# Patient Record
Sex: Male | Born: 1975 | Hispanic: Yes | Marital: Single | Smoking: Never smoker
Health system: Southern US, Community
[De-identification: ages and names within clinical notes are randomized; demographics above are authoritative.]

## PROBLEM LIST (undated history)

## (undated) DIAGNOSIS — E119 Type 2 diabetes mellitus without complications: Secondary | ICD-10-CM

## (undated) DIAGNOSIS — E11319 Type 2 diabetes mellitus with unspecified diabetic retinopathy without macular edema: Secondary | ICD-10-CM

## (undated) DIAGNOSIS — L0231 Cutaneous abscess of buttock: Secondary | ICD-10-CM

## (undated) DIAGNOSIS — I10 Essential (primary) hypertension: Secondary | ICD-10-CM

## (undated) DIAGNOSIS — I2510 Atherosclerotic heart disease of native coronary artery without angina pectoris: Secondary | ICD-10-CM

## (undated) DIAGNOSIS — E1169 Type 2 diabetes mellitus with other specified complication: Secondary | ICD-10-CM

## (undated) HISTORY — DX: Essential (primary) hypertension: I10

## (undated) HISTORY — DX: Type 2 diabetes mellitus with other specified complication: E11.69

## (undated) HISTORY — DX: Atherosclerotic heart disease of native coronary artery without angina pectoris: I25.10

## (undated) HISTORY — DX: Type 2 diabetes mellitus without complications: E11.9

## (undated) HISTORY — DX: Cutaneous abscess of buttock: L02.31

## (undated) HISTORY — DX: Type 2 diabetes mellitus with unspecified diabetic retinopathy without macular edema: E11.319

---

## 2014-02-14 ENCOUNTER — Observation Stay (HOSPITAL_COMMUNITY): Payer: Self-pay

## 2014-02-14 ENCOUNTER — Observation Stay (HOSPITAL_COMMUNITY)
Admission: EM | Admit: 2014-02-14 | Discharge: 2014-02-15 | Disposition: A | Discharge status: Home / Self Care | Payer: Self-pay | Attending: Internal Medicine | Admitting: Internal Medicine

## 2014-02-14 ENCOUNTER — Encounter (HOSPITAL_COMMUNITY): Payer: Self-pay | Admitting: Emergency Medicine

## 2014-02-14 DIAGNOSIS — R112 Nausea with vomiting, unspecified: Secondary | ICD-10-CM | POA: Diagnosis present

## 2014-02-14 DIAGNOSIS — E1010 Type 1 diabetes mellitus with ketoacidosis without coma: Principal | ICD-10-CM | POA: Insufficient documentation

## 2014-02-14 DIAGNOSIS — E1110 Type 2 diabetes mellitus with ketoacidosis without coma: Secondary | ICD-10-CM | POA: Diagnosis present

## 2014-02-14 DIAGNOSIS — E876 Hypokalemia: Secondary | ICD-10-CM | POA: Insufficient documentation

## 2014-02-14 DIAGNOSIS — Z794 Long term (current) use of insulin: Secondary | ICD-10-CM | POA: Insufficient documentation

## 2014-02-14 DIAGNOSIS — K5900 Constipation, unspecified: Secondary | ICD-10-CM | POA: Insufficient documentation

## 2014-02-14 HISTORY — DX: Type 2 diabetes mellitus without complications: E11.9

## 2014-02-14 LAB — URINALYSIS, ROUTINE W REFLEX MICROSCOPIC
BILIRUBIN URINE: NEGATIVE
Glucose, UA: >1000 mg/dL — AB
Hgb urine dipstick: NEGATIVE
Ketones, ur: NEGATIVE mg/dL
NITRITE: NEGATIVE
Protein, ur: NEGATIVE mg/dL
Specific Gravity, Urine: 1.017 (ref 1.005–1.030)
UROBILINOGEN UA: 1 mg/dL (ref 0.0–1.0)
pH: 6.5 (ref 5.0–8.0)

## 2014-02-14 LAB — BASIC METABOLIC PANEL
BUN: 13 mg/dL (ref 6–23)
CALCIUM: 8.3 mg/dL — AB (ref 8.4–10.5)
CO2: 20 mEq/L (ref 19–32)
Chloride: 106 mEq/L (ref 96–112)
Creatinine, Ser: 0.51 mg/dL (ref 0.50–1.35)
GFR calc Af Amer: >90 mL/min (ref >90)
GLUCOSE: 163 mg/dL — AB (ref 70–99)
Potassium: 3.2 mEq/L — ABNORMAL LOW (ref 3.7–5.3)
SODIUM: 137 meq/L (ref 137–147)

## 2014-02-14 LAB — I-STAT ARTERIAL BLOOD GAS, ED
ACID-BASE DEFICIT: 5 mmol/L — AB (ref 0.0–2.0)
BICARBONATE: 19.6 meq/L — AB (ref 20.0–24.0)
O2 SAT: 96 %
Patient temperature: 98.6
TCO2: 21 mmol/L (ref 0–100)
pCO2 arterial: 35.6 mmHg (ref 35.0–45.0)
pH, Arterial: 7.349 — ABNORMAL LOW (ref 7.350–7.450)
pO2, Arterial: 84 mmHg (ref 80.0–100.0)

## 2014-02-14 LAB — CBC WITH DIFFERENTIAL/PLATELET
Basophils Absolute: 0 10*3/uL (ref 0.0–0.1)
Basophils Relative: 0 % (ref 0–1)
EOS ABS: 0.1 10*3/uL (ref 0.0–0.7)
Eosinophils Relative: 1 % (ref 0–5)
HCT: 41.4 % (ref 39.0–52.0)
Hemoglobin: 15.5 g/dL (ref 13.0–17.0)
LYMPHS ABS: 2.5 10*3/uL (ref 0.7–4.0)
LYMPHS PCT: 33 % (ref 12–46)
MCH: 30.5 pg (ref 26.0–34.0)
MCHC: 37.4 g/dL — ABNORMAL HIGH (ref 30.0–36.0)
MCV: 81.3 fL (ref 78.0–100.0)
Monocytes Absolute: 0.6 10*3/uL (ref 0.1–1.0)
Monocytes Relative: 8 % (ref 3–12)
NEUTROS ABS: 4.5 10*3/uL (ref 1.7–7.7)
NEUTROS PCT: 58 % (ref 43–77)
PLATELETS: 239 10*3/uL (ref 150–400)
RBC: 5.09 MIL/uL (ref 4.22–5.81)
RDW: 13.7 % (ref 11.5–15.5)
WBC: 7.7 10*3/uL (ref 4.0–10.5)

## 2014-02-14 LAB — COMPREHENSIVE METABOLIC PANEL
ALK PHOS: 138 U/L — AB (ref 39–117)
AST: 17 U/L (ref 0–37)
Albumin: 3.9 g/dL (ref 3.5–5.2)
BUN: 18 mg/dL (ref 6–23)
CO2: 20 meq/L (ref 19–32)
Calcium: 10.4 mg/dL (ref 8.4–10.5)
Chloride: 91 mEq/L — ABNORMAL LOW (ref 96–112)
Creatinine, Ser: 0.67 mg/dL (ref 0.50–1.35)
GFR calc non Af Amer: >90 mL/min (ref >90)
GLUCOSE: 388 mg/dL — AB (ref 70–99)
POTASSIUM: 3 meq/L — AB (ref 3.7–5.3)
SODIUM: 129 meq/L — AB (ref 137–147)
TOTAL PROTEIN: 8.2 g/dL (ref 6.0–8.3)
Total Bilirubin: 0.6 mg/dL (ref 0.3–1.2)

## 2014-02-14 LAB — GLUCOSE, CAPILLARY
GLUCOSE-CAPILLARY: 252 mg/dL — AB (ref 70–99)
Glucose-Capillary: 165 mg/dL — ABNORMAL HIGH (ref 70–99)
Glucose-Capillary: 178 mg/dL — ABNORMAL HIGH (ref 70–99)
Glucose-Capillary: 204 mg/dL — ABNORMAL HIGH (ref 70–99)
Glucose-Capillary: 186 mg/dL — ABNORMAL HIGH (ref 70–99)
Glucose-Capillary: 150 mg/dL — ABNORMAL HIGH (ref 70–99)

## 2014-02-14 LAB — URINE MICROSCOPIC-ADD ON

## 2014-02-14 LAB — HEMOGLOBIN A1C
HEMOGLOBIN A1C: 13.5 % — AB (ref <5.7)
MEAN PLASMA GLUCOSE: 341 mg/dL — AB (ref <117)

## 2014-02-14 LAB — LIPASE, BLOOD: Lipase: 29 U/L (ref 11–59)

## 2014-02-14 MED ORDER — HYDROCODONE-ACETAMINOPHEN 5-325 MG PO TABS: 1.0000 | ORAL_TABLET | ORAL | Status: DC | PRN

## 2014-02-14 MED ORDER — INSULIN ASPART 100 UNIT/ML ~~LOC~~ SOLN
0.0000 [IU] | Freq: Every day | SUBCUTANEOUS | Status: DC
  Administered 2014-02-14: 3 [IU] via SUBCUTANEOUS

## 2014-02-14 MED ORDER — ENOXAPARIN SODIUM 40 MG/0.4ML ~~LOC~~ SOLN
40.0000 mg | SUBCUTANEOUS | Status: DC
  Administered 2014-02-14 – 2014-02-15 (×2): 40 mg via SUBCUTANEOUS
  Filled 2014-02-14 (×2): qty 0.4

## 2014-02-14 MED ORDER — ACETAMINOPHEN 325 MG PO TABS: 650.0000 mg | ORAL_TABLET | Freq: Four times a day (QID) | ORAL | Status: DC | PRN

## 2014-02-14 MED ORDER — GUAIFENESIN-DM 100-10 MG/5ML PO SYRP: 5.0000 mL | ORAL_SOLUTION | ORAL | Status: DC | PRN

## 2014-02-14 MED ORDER — SODIUM CHLORIDE 0.9 % IV SOLN
INTRAVENOUS | Status: DC
  Administered 2014-02-14: 13:00:00 via INTRAVENOUS

## 2014-02-14 MED ORDER — POTASSIUM CHLORIDE CRYS ER 20 MEQ PO TBCR
40.0000 meq | EXTENDED_RELEASE_TABLET | Freq: Once | ORAL | Status: AC
  Administered 2014-02-14: 40 meq via ORAL
  Filled 2014-02-14: qty 2

## 2014-02-14 MED ORDER — ONDANSETRON HCL 4 MG/2ML IJ SOLN: 4.0000 mg | Freq: Four times a day (QID) | INTRAMUSCULAR | Status: DC | PRN

## 2014-02-14 MED ORDER — POTASSIUM CHLORIDE 10 MEQ/100ML IV SOLN
10.0000 meq | INTRAVENOUS | Status: AC
  Administered 2014-02-14 (×3): 10 meq via INTRAVENOUS
  Filled 2014-02-14 (×3): qty 100

## 2014-02-14 MED ORDER — PANTOPRAZOLE SODIUM 40 MG IV SOLR
40.0000 mg | INTRAVENOUS | Status: DC
  Administered 2014-02-14: 40 mg via INTRAVENOUS
  Filled 2014-02-14 (×3): qty 40

## 2014-02-14 MED ORDER — INSULIN DETEMIR 100 UNIT/ML ~~LOC~~ SOLN
15.0000 [IU] | Freq: Every day | SUBCUTANEOUS | Status: DC
  Administered 2014-02-14 – 2014-02-15 (×2): 15 [IU] via SUBCUTANEOUS
  Filled 2014-02-14 (×2): qty 0.15

## 2014-02-14 MED ORDER — MORPHINE SULFATE 2 MG/ML IJ SOLN: 1.0000 mg | INTRAMUSCULAR | Status: DC | PRN

## 2014-02-14 MED ORDER — ALBUTEROL SULFATE (2.5 MG/3ML) 0.083% IN NEBU: 2.5000 mg | INHALATION_SOLUTION | RESPIRATORY_TRACT | Status: DC | PRN

## 2014-02-14 MED ORDER — POLYETHYLENE GLYCOL 3350 17 G PO PACK
17.0000 g | PACK | Freq: Two times a day (BID) | ORAL | Status: DC
  Administered 2014-02-14 – 2014-02-15 (×3): 17 g via ORAL
  Filled 2014-02-14 (×4): qty 1

## 2014-02-14 MED ORDER — SENNOSIDES-DOCUSATE SODIUM 8.6-50 MG PO TABS
2.0000 | ORAL_TABLET | Freq: Two times a day (BID) | ORAL | Status: DC
  Administered 2014-02-14 – 2014-02-15 (×3): 2 via ORAL
  Filled 2014-02-14 (×4): qty 2

## 2014-02-14 MED ORDER — POTASSIUM CHLORIDE 10 MEQ/100ML IV SOLN
10.0000 meq | Freq: Once | INTRAVENOUS | Status: AC
  Administered 2014-02-14: 10 meq via INTRAVENOUS
  Filled 2014-02-14: qty 100

## 2014-02-14 MED ORDER — ONDANSETRON HCL 4 MG PO TABS: 4.0000 mg | ORAL_TABLET | Freq: Four times a day (QID) | ORAL | Status: DC | PRN

## 2014-02-14 MED ORDER — FLEET ENEMA 7-19 GM/118ML RE ENEM
1.0000 | ENEMA | Freq: Once | RECTAL | Status: AC
  Administered 2014-02-14: 1 via RECTAL
  Filled 2014-02-14: qty 1

## 2014-02-14 MED ORDER — ACETAMINOPHEN 650 MG RE SUPP: 650.0000 mg | Freq: Four times a day (QID) | RECTAL | Status: DC | PRN

## 2014-02-14 MED ORDER — ONDANSETRON HCL 4 MG/2ML IJ SOLN
4.0000 mg | Freq: Once | INTRAMUSCULAR | Status: AC
  Administered 2014-02-14: 4 mg via INTRAVENOUS
  Filled 2014-02-14: qty 2

## 2014-02-14 MED ORDER — INSULIN ASPART 100 UNIT/ML ~~LOC~~ SOLN
0.0000 [IU] | Freq: Three times a day (TID) | SUBCUTANEOUS | Status: DC
  Administered 2014-02-14: 1 [IU] via SUBCUTANEOUS
  Administered 2014-02-14 – 2014-02-15 (×2): 2 [IU] via SUBCUTANEOUS

## 2014-02-14 MED ORDER — SODIUM CHLORIDE 0.9 % IV BOLUS (SEPSIS)
1000.0000 mL | Freq: Once | INTRAVENOUS | Status: AC
  Administered 2014-02-14: 1000 mL via INTRAVENOUS

## 2014-02-14 MED ORDER — FLEET ENEMA 7-19 GM/118ML RE ENEM
1.0000 | ENEMA | Freq: Every day | RECTAL | Status: DC | PRN
  Filled 2014-02-14: qty 1

## 2014-02-14 NOTE — ED Notes (Signed)
Pt. reports nausea and vomitting onset 3 days ago with mid abdominal pain when vomitting , denies fever or diarrhea.

## 2014-02-14 NOTE — Progress Notes (Signed)
Nutrition Brief Note  RD consulted for nutrition assessment.    Wt Readings from Last 15 Encounters:  02/14/14 139 lb 5.3 oz (63.2 kg)    Body mass index is 23.19 kg/(m^2). Patient meets criteria for Normal Weight based on current BMI.  Pt resting comfortably at time of visit. Pt is Spanish speaking- interpreter used for assessment. Pt denies weight loss stating he usually weighs 130 lbs. Pt states that he has not eaten solid food since Saturday but, his appetite is great.  He reports having his blood sugars under control without problems usually but, for the past 2 months he has been in [place] and has been without insulin. Pt interested in reviewing carbohydrates- Diabetes Coordinator in room with handout ready to discuss with pt.   Current diet order is Clear Liquids, patient is consuming approximately 100% of meals at this time. Labs and medications reviewed.   No nutrition interventions warranted at this time. If nutrition issues arise, please consult RD.   Ian Malkineanne Barnett RD, LDN Inpatient Clinical Dietitian Pager: [phone] After Hours Pager: [phone]

## 2014-02-14 NOTE — Progress Notes (Signed)
Inpatient Diabetes Program Recommendations  AACE/ADA: New Consensus Statement on Inpatient Glycemic Control (2013)  Target Ranges:  Prepandial:   less than 140 mg/dL      Peak postprandial:   less than 180 mg/dL (1-2 hours)      Critically ill patients:  140 - 180 mg/dL   This coordinator met with patient and Spanish interpreter Forest Gleason from [employer].  Patient is knowledgeable about his diabetes management and reports no problems prior to this admission.  Patient is followed by a PCP at a local clinic (could not remember name) and purchases his insulin from [facility] for $25 a vial.  He is able to report that his insulin is "70/30" therefore I believe it must be the ReliOn Novolin 70/30 because it is $25 a vial at [facility].  Patient cannot remember doses but has the information at home.  He just returned from [place] last night and reports he was without insulin for approximately two months.  He was also eating minimal food but did have water to drink.  Basic meal planning for diabetes guide was given to patient in Spanish and an insulin starter kit just in case he does not have supplies when he returns home.  Patient does not have any further questions/concerns at this time.  Thank you  Raoul Pitch BSN, RN,CDE Inpatient Diabetes Coordinator [phone] (team pager)

## 2014-02-14 NOTE — H&P (Signed)
PATIENT DETAILS Name: Drew [name] Age: 38 y.o. Sex: male Date of Birth: 02/26/1976 Admit Date: 02/14/2014 PCP:No PCP Per Patient   CHIEF COMPLAINT:  Vomiting  HPI: Drew [name] is a 38 y.o. male with a Past Medical History of diabetes claims to not being on insulin for the past 2 months, just a light [place] from [place] yesterday who presents today with the above noted complaint. The patient, for the past few days he has had persistent nausea along with vomiting. He claims to have some mild abdominal discomfort as well. For the past few weeks he claims to have polyuria and polydipsia. She was seen in the emergency department for these complaints and found to have significantly elevated blood sugar with mild diabetic ketoacidosis. I was subsequently asked to admit this patient for further evaluation and treatment Patient claims to have subjective fevers while at home. However denies any headache, shortness of breath, chest pain. There is no history of diarrhea.   ALLERGIES:  No Known Allergies  PAST MEDICAL HISTORY: Past Medical History  Diagnosis Date  . Diabetes mellitus without complication     PAST SURGICAL HISTORY: History reviewed. No pertinent past surgical history.  MEDICATIONS AT HOME: Prior to Admission medications   Not on File    FAMILY HISTORY: No family history of diabetes  SOCIAL HISTORY:  reports that he has never smoked. He does not have any smokeless tobacco history on file. He reports that he does not drink alcohol or use illicit drugs.  REVIEW OF SYSTEMS:  Constitutional:   No  weight loss, night sweats,  Fevers, chills, fatigue.  HEENT:    No headaches, Difficulty swallowing,Tooth/dental problems,Sore throat,  No sneezing, itching, ear ache, nasal congestion, post nasal drip,   Cardio-vascular: No chest pain,  Orthopnea, PND, swelling in lower extremities, anasarca,  dizziness, palpitations  GI:  No heartburn, indigestion,   diarrhea, change in  bowel habits, loss of appetite  Resp: No shortness of breath with exertion or at rest.  No excess mucus, no productive cough, No non-productive cough,  No coughing up of blood.No change in color of mucus.No wheezing.No chest wall deformity  Skin:  no rash or lesions.  GU:  no dysuria, change in color of urine, no urgency or frequency.  No flank pain.  Musculoskeletal: No joint pain or swelling.  No decreased range of motion.  No back pain.  Psych: No change in mood or affect. No depression or anxiety.  No memory loss.   PHYSICAL EXAM: Blood pressure 109/72, pulse 88, temperature 97.7 F (36.5 C), temperature source Oral, resp. rate 16, height 5\' 5"  (1.651 m), weight 63.2 kg (139 lb 5.3 oz), SpO2 100.00%.  General appearance :Awake, alert, not in any distress. Speech Clear. Not toxic Looking HEENT: Atraumatic and Normocephalic, pupils equally reactive to light and accomodation Neck: supple, no JVD. No cervical lymphadenopathy.  Chest:Good air entry bilaterally, no added sounds  CVS: S1 S2 regular, no murmurs.  Abdomen: Bowel sounds present, Non tender and not distended with no gaurding, rigidity or rebound. Extremities: B/L Lower Ext shows no edema, both legs are warm to touch Neurology: Awake alert, and oriented X 3, CN II-XII intact, Non focal Skin:No Rash Wounds:N/A  LABS ON ADMISSION:   Recent Labs  02/14/14 0101  NA 129*  K 3.0*  CL 91*  CO2 20  GLUCOSE 388*  BUN 18  CREATININE 0.67  CALCIUM 10.4    Recent Labs  02/14/14 0101  AST 17  ALT <5  ALKPHOS 138*  BILITOT 0.6  PROT 8.2  ALBUMIN 3.9    Recent Labs  02/14/14 0101  LIPASE 29    Recent Labs  02/14/14 0101  WBC 7.7  NEUTROABS 4.5  HGB 15.5  HCT 41.4  MCV 81.3  PLT 239   No results found for this basename: CKTOTAL, CKMB, CKMBINDEX, TROPONINI,  in the last 72 hours No results found for this basename: DDIMER,  in the last 72 hours No components found with this  basename: POCBNP,    RADIOLOGIC STUDIES ON ADMISSION: Dg Abd Acute W/chest  02/14/2014   CLINICAL DATA:  Vomiting  EXAM: ACUTE ABDOMEN SERIES (ABDOMEN 2 VIEW & CHEST 1 VIEW)  COMPARISON:  None.  FINDINGS: No bowel dilation is seen to suggest obstruction or adynamic ileus. No free air.  There is moderate increased stool throughout the colon.  Abdominal pelvic soft tissues are unremarkable.  Normal heart, mediastinum and hila.  Clear lungs.  Normal bony structures.  IMPRESSION: Moderate increased stool throughout the colon.  No acute findings.   Electronically Signed   By: Amie Portlandavid  Ormond M.D.   On: 02/14/2014 09:21   ASSESSMENT AND PLAN: Present on Admission:  . Mild DKA (diabetic ketoacidoses) - During my evaluation patient CBG already down in the 160s range, not started on insulin infusion in the emergency room likely secondary to hypokalemia. Will hydrate, and recheck chemistries this afternoon and decide whether or not patient needs an insulin drip. For now we'll manage with subcutaneous insulin and sliding scale.   . Nausea with vomiting - Abdomen soft, abdominal x-ray does not show any acute abnormalities-shows constipation. We'll provide supportive care, if vomiting persists, will need a gastric imaging study. Clear liquids diet and advance as tolerated.  .Hypokalemia - Secondary to GI loss, replete and recheck.  . Long-standing history of diabetes - Claims to have not taken insulin for the past few months. We'll check A1c. Diabetic coordinator evaluation.  . Constipation - Place on bowel regimen and follow.  Further plan will depend as patient's clinical course evolves and further radiologic and laboratory data become available. Patient will be monitored closely.  Above noted plan was discussed with patient, he was in agreement.   DVT Prophylaxis: Prophylactic  Heparin  Code Status: Full Code  Total time spent for admission equals 45 minutes.  Maretta BeesShanker M Katena Petitjean [facility] Pager [phone]  If 7PM-7AM, please contact night-coverage [email or web address] Password [email or web address] 02/14/2014, 12:23 PM  **Disclaimer: This note may have been dictated with voice recognition software. Similar sounding words can inadvertently be transcribed and this note may contain transcription errors which may not have been corrected upon publication of note.**

## 2014-02-14 NOTE — ED Provider Notes (Signed)
CSN: [id number]     Arrival date & time 02/14/14  0040 History   First MD Initiated Contact with Patient 02/14/14 0430     Chief Complaint  Patient presents with  . Emesis     (Consider location/radiation/quality/duration/timing/severity/associated sxs/prior Treatment) HPI History provided by patient through Spanish speaking interpreter. Patient is an insulin-dependent diabetic. He was previously living in [place] and has moved here yesterday.  He has not had insulin in a month.  Suggested is having nausea vomiting and generally does not feel well. Patient worried that his blood sugar is elevated. No fevers or chills. No abdominal pain. No headache. Complains of urinary frequency. No dysuria. Symptoms moderate in severity. History reviewed. No pertinent past medical history. History reviewed. No pertinent past surgical history. No family history on file. History  Substance Use Topics  . Smoking status: Never Smoker   . Smokeless tobacco: Not on file  . Alcohol Use: No    Review of Systems  Constitutional: Negative for fever and chills.  Eyes: Negative for pain.  Respiratory: Negative for shortness of breath.   Cardiovascular: Negative for chest pain.  Gastrointestinal: Positive for nausea and vomiting. Negative for abdominal pain and diarrhea.  Endocrine: Positive for polydipsia and polyuria.  Genitourinary: Negative for flank pain.  Musculoskeletal: Negative for back pain, neck pain and neck stiffness.  Skin: Negative for rash.  Neurological: Negative for headaches.  All other systems reviewed and are negative.     Allergies  Review of patient's allergies indicates no known allergies.  Home Medications   Prior to Admission medications   Not on File   BP 130/81  Pulse 102  Temp(Src) 98.6 F (37 C) (Oral)  Resp 18  SpO2 97% Physical Exam  Constitutional: He is oriented to person, place, and time. He appears well-developed and well-nourished.  HENT:  Head:  Normocephalic and atraumatic.  Dry mucous membranes  Eyes: EOM are normal. Pupils are equal, round, and reactive to light.  Neck: Neck supple.  Cardiovascular: Regular rhythm and intact distal pulses.   Tachycardia  Pulmonary/Chest: Effort normal and breath sounds normal. No respiratory distress.  Abdominal: Soft. Bowel sounds are normal. He exhibits no distension. There is no tenderness.  Musculoskeletal: Normal range of motion. He exhibits no edema and no tenderness.  Neurological: He is alert and oriented to person, place, and time.  Skin: Skin is warm and dry.    ED Course  Procedures (including critical care time) Labs Review Labs Reviewed  CBC WITH DIFFERENTIAL - Abnormal; Notable for the following:    MCHC 37.4 (*)    All other components within normal limits  COMPREHENSIVE METABOLIC PANEL - Abnormal; Notable for the following:    Sodium 129 (*)    Potassium 3.0 (*)    Chloride 91 (*)    Glucose, Bld 388 (*)    Alkaline Phosphatase 138 (*)    All other components within normal limits  I-STAT ARTERIAL BLOOD GAS, ED - Abnormal; Notable for the following:    pH, Arterial 7.349 (*)    Bicarbonate 19.6 (*)    Acid-base deficit 5.0 (*)    All other components within normal limits  LIPASE, BLOOD  URINALYSIS, ROUTINE W REFLEX MICROSCOPIC  BLOOD GAS, ARTERIAL    CRITICAL CARE Performed by: Sunnie NielsenBrian Rosser [place] Total critical care time: 30 Critical care time was exclusive of separately billable procedures and treating other patients. Critical care was necessary to treat or prevent imminent or life-threatening deterioration. Critical care was time spent  personally by me on the following activities: development of treatment plan with patient and/or surrogate as well as nursing, discussions with consultants, evaluation of patient's response to treatment, examination of patient, obtaining history from patient or surrogate, ordering and performing treatments and interventions, ordering and  review of laboratory studies, ordering and review of radiographic studies, pulse oximetry and re-evaluation of patient's condition.  IV fluids x3 1 L boluses  Potassium is low, getting IV potassium until symptoms controlled and potassium corrected prior to insulin. ABG shows early DKA with anion gap acidosis. Discussed with Dr. Betti Cruzeddy and plan medical admission. MDM   Diagnosis: DKA, hypokalemia  Insulin-dependent diabetic presenting with nausea and vomiting and concern for elevated blood sugar, no insulin for the last month Found to be hypokalemic with early DKA IV fluids provided. Potassium provided. Medical admission.    Sunnie NielsenBrian Nimesh Riolo, MD 02/14/14 [phone]

## 2014-02-14 NOTE — ED Notes (Signed)
NSR 79 on monitor

## 2014-02-14 NOTE — ED Notes (Signed)
Per family, patient has had N/V for the past day. States he has not taken his insulin for the past month because he was in [place].

## 2014-02-15 LAB — CBC
HEMATOCRIT: 36.5 % — AB (ref 39.0–52.0)
HEMOGLOBIN: 13.2 g/dL (ref 13.0–17.0)
MCH: 29.8 pg (ref 26.0–34.0)
MCHC: 36.2 g/dL — AB (ref 30.0–36.0)
MCV: 82.4 fL (ref 78.0–100.0)
Platelets: 205 10*3/uL (ref 150–400)
RBC: 4.43 MIL/uL (ref 4.22–5.81)
RDW: 14.1 % (ref 11.5–15.5)
WBC: 3.9 10*3/uL — ABNORMAL LOW (ref 4.0–10.5)

## 2014-02-15 LAB — BASIC METABOLIC PANEL
BUN: 8 mg/dL (ref 6–23)
CHLORIDE: 111 meq/L (ref 96–112)
CO2: 20 meq/L (ref 19–32)
Calcium: 8.6 mg/dL (ref 8.4–10.5)
Creatinine, Ser: 0.52 mg/dL (ref 0.50–1.35)
GFR calc Af Amer: >90 mL/min (ref >90)
GFR calc non Af Amer: >90 mL/min (ref >90)
GLUCOSE: 179 mg/dL — AB (ref 70–99)
POTASSIUM: 3.2 meq/L — AB (ref 3.7–5.3)
SODIUM: 142 meq/L (ref 137–147)

## 2014-02-15 LAB — GLUCOSE, CAPILLARY: GLUCOSE-CAPILLARY: 197 mg/dL — AB (ref 70–99)

## 2014-02-15 MED ORDER — INSULIN NPH ISOPHANE & REGULAR (70-30) 100 UNIT/ML ~~LOC~~ SUSP: 20.0000 [IU] | Freq: Every day | SUBCUTANEOUS | Status: DC

## 2014-02-15 MED ORDER — POTASSIUM CHLORIDE CRYS ER 20 MEQ PO TBCR
40.0000 meq | EXTENDED_RELEASE_TABLET | Freq: Two times a day (BID) | ORAL | Status: DC
  Administered 2014-02-15: 40 meq via ORAL
  Filled 2014-02-15: qty 2

## 2014-02-15 MED ORDER — INSULIN NPH ISOPHANE & REGULAR (70-30) 100 UNIT/ML ~~LOC~~ SUSP: 40.0000 [IU] | Freq: Every day | SUBCUTANEOUS | Status: DC

## 2014-02-15 MED ORDER — PANTOPRAZOLE SODIUM 40 MG PO TBEC
40.0000 mg | DELAYED_RELEASE_TABLET | Freq: Every day | ORAL | Status: DC
  Administered 2014-02-15: 40 mg via ORAL
  Filled 2014-02-15: qty 1

## 2014-02-15 NOTE — Discharge Summary (Signed)
Physician Discharge Summary  Drew [name] UJW:[id number] DOB: Oct 09, 1975 DOA: 02/14/2014  PCP: No PCP Per Patient  Admit date: 02/14/2014 Discharge date: 02/15/2014  Time spent: 35 minutes  Recommendations for Outpatient Follow-up:  1. Follow up with PCP at the wellness center in 1 week. 2. Titrate insulin as needed.   Discharge Diagnoses:  Principal Problem:   DKA (diabetic ketoacidoses) Active Problems:   Nausea with vomiting   Discharge Condition: stable  Diet recommendation: carb modified  Filed Weights   02/14/14 0613 02/15/14 0627  Weight: 63.2 kg (139 lb 5.3 oz) 65.2 kg (143 lb 11.8 oz)    History of present illness:  38 y.o. male with a Past Medical History of diabetes claims to not being on insulin for the past 2 months, just a light [place] from [place] yesterday who presents today with the above noted complaint. The patient, for the past few days he has had persistent nausea along with vomiting. He claims to have some mild abdominal discomfort as well. For the past few weeks he claims to have polyuria and polydipsia. She was seen in the emergency department for these complaints and found to have significantly elevated blood sugar with mild diabetic ketoacidosis. I was subsequently asked to admit this patient for further evaluation and treatment  Patient claims to have subjective fevers while at home. However denies any headache, shortness of breath, chest pain. There is no history of diarrhea.   Hospital Course:  Mild DKA (diabetic ketoacidoses)  - Pt was hydrated IV and with subcutaneous insulin. _ CBG's remained < 200. DKA resloved. - he relates he ran out of his insulin. - placed backed on insulin 70/30 40 units in the am and 20 in the evening.  . Nausea with vomiting  - Abdomen soft, abdominal x-ray does not show any acute abnormalities-shows constipation.  - We'll provide supportive care.  Hypokalemia  - Secondary to GI loss, replete and recheck.    . Long-standing history of diabetes  - Claims to have not taken insulin for the past few months.     Procedures:  abd x-ray  Consultations:  none  Discharge Exam: Filed Vitals:   02/15/14 0627  BP: 92/68  Pulse: 79  Temp: 97.9 F (36.6 C)  Resp: 19    General: A&O x3 Cardiovascular: RRR Respiratory: good air movement CTA B/L  Discharge Instructions You were cared for by a hospitalist during your hospital stay. If you have any questions about your discharge medications or the care you received while you were in the hospital after you are discharged, you can call the unit and asked to speak with the hospitalist on call if the hospitalist that took care of you is not available. Once you are discharged, your primary care physician will handle any further medical issues. Please note that NO REFILLS for any discharge medications will be authorized once you are discharged, as it is imperative that you return to your primary care physician (or establish a relationship with a primary care physician if you do not have one) for your aftercare needs so that they can reassess your need for medications and monitor your lab values.      Discharge Instructions   Diet - low sodium heart healthy    Complete by:  As directed      Increase activity slowly    Complete by:  As directed             Medication List  insulin NPH-regular Human (70-30) 100 UNIT/ML injection  Commonly known as:  NOVOLIN 70/30  Inject 40 Units into the skin daily with breakfast.     insulin NPH-regular Human (70-30) 100 UNIT/ML injection  Commonly known as:  NOVOLIN 70/30  Inject 20 Units into the skin daily with supper.       No Known Allergies Follow-up Information   Follow up with [facility]     In 1 week. (hospital follow up)    Contact information:   [address] [place] [address] [phone]       The results of significant diagnostics  from this hospitalization (including imaging, microbiology, ancillary and laboratory) are listed below for reference.    Significant Diagnostic Studies: Dg Abd Acute W/chest  02/14/2014   CLINICAL DATA:  Vomiting  EXAM: ACUTE ABDOMEN SERIES (ABDOMEN 2 VIEW & CHEST 1 VIEW)  COMPARISON:  None.  FINDINGS: No bowel dilation is seen to suggest obstruction or adynamic ileus. No free air.  There is moderate increased stool throughout the colon.  Abdominal pelvic soft tissues are unremarkable.  Normal heart, mediastinum and hila.  Clear lungs.  Normal bony structures.  IMPRESSION: Moderate increased stool throughout the colon.  No acute findings.   Electronically Signed   By: Amie Portlandavid  Ormond M.D.   On: 02/14/2014 09:21    Microbiology: No results found for this or any previous visit (from the past 240 hour(s)).   Labs: Basic Metabolic Panel:  Recent Labs Lab 02/14/14 0101 02/14/14 1200 02/15/14 0509  NA 129* 137 142  K 3.0* 3.2* 3.2*  CL 91* 106 111  CO2 20 20 20   GLUCOSE 388* 163* 179*  BUN 18 13 8   CREATININE 0.67 0.51 0.52  CALCIUM 10.4 8.3* 8.6   Liver Function Tests:  Recent Labs Lab 02/14/14 0101  AST 17  ALT <5  ALKPHOS 138*  BILITOT 0.6  PROT 8.2  ALBUMIN 3.9    Recent Labs Lab 02/14/14 0101  LIPASE 29   No results found for this basename: AMMONIA,  in the last 168 hours CBC:  Recent Labs Lab 02/14/14 0101 02/15/14 0509  WBC 7.7 3.9*  NEUTROABS 4.5  --   HGB 15.5 13.2  HCT 41.4 36.5*  MCV 81.3 82.4  PLT 239 205   Cardiac Enzymes: No results found for this basename: CKTOTAL, CKMB, CKMBINDEX, TROPONINI,  in the last 168 hours BNP: BNP (last 3 results) No results found for this basename: PROBNP,  in the last 8760 hours CBG:  Recent Labs Lab 02/14/14 0951 02/14/14 1111 02/14/14 1619 02/14/14 2149 02/15/14 0632  GLUCAP 204* 186* 150* 252* 197*       Signed:  Marinda ElkAbraham Feliz Ortiz  [facility] 02/15/2014, 9:40 AM

## 2014-02-15 NOTE — Care Management Note (Addendum)
  Page 2 of 2   02/15/2014     12:22:07 PM CARE MANAGEMENT NOTE 02/15/2014  Patient:  Drew [name],Drew [name]   Account Number:  [id number]  Date Initiated:  02/15/2014  Documentation initiated by:  Donato SchultzHUTCHINSON,Trenae Brunke  Subjective/Objective Assessment:   Admitted with DKA     Action/Plan:   CM to follow for dispositon needs   Anticipated DC Date:  02/15/2014   Anticipated DC Plan:  HOME/SELF CARE      DC Planning Services  CM consult  Medication Assistance  Follow-up appt scheduled  [facility] / P4HM (established/new)  [facility]      [facility]  NA   Choice offered to / List presented to:  NA           Status of service:  Completed, signed off Medicare Important Message given?   (If response is "NO", the following Medicare IM given date fields will be blank) Date Medicare IM given:   Date Additional Medicare IM given:    Discharge Disposition:  HOME/SELF CARE  Per UR Regulation:  Reviewed for med. necessity/level of care/duration of stay  If discussed at Long Length of Stay Meetings, dates discussed:    Comments:  Eldean Nanna RN, BSN, MSHL, CCM  Nurse - Case Manager, ([facility] [place])  [phone]  02/15/2014 Social:  [place]. Spanish speaking only. Self-pay Dispsotion Plan:  D/c home with Rx of 70/30.  Cost: [facility].00 at [employer].  Patient confirms he can afford this amount. CM provided Orange application and instuctions on [facility]. [facility] to request to also schedule Orange card application as well as PCP appt. CM notified that patient only speaks [place]. Follow up with [facility] In 1 week. (hospital follow up left a message ) [address] [place] [id number] [phone]

## 2014-02-15 NOTE — Progress Notes (Signed)
Discharged home. Used Engineer, structuralpanish translator for discharge teaching. Left with brother.

## 2018-07-31 ENCOUNTER — Emergency Department (HOSPITAL_COMMUNITY)
Admission: EM | Admit: 2018-07-31 | Discharge: 2018-08-01 | Disposition: A | Discharge status: Home / Self Care | Payer: Self-pay | Attending: Emergency Medicine | Admitting: Emergency Medicine

## 2018-07-31 ENCOUNTER — Emergency Department (HOSPITAL_COMMUNITY): Payer: Self-pay

## 2018-07-31 ENCOUNTER — Other Ambulatory Visit: Payer: Self-pay

## 2018-07-31 ENCOUNTER — Encounter (HOSPITAL_COMMUNITY): Payer: Self-pay | Admitting: Emergency Medicine

## 2018-07-31 DIAGNOSIS — E119 Type 2 diabetes mellitus without complications: Secondary | ICD-10-CM | POA: Insufficient documentation

## 2018-07-31 DIAGNOSIS — Z794 Long term (current) use of insulin: Secondary | ICD-10-CM | POA: Insufficient documentation

## 2018-07-31 DIAGNOSIS — B309 Viral conjunctivitis, unspecified: Secondary | ICD-10-CM | POA: Insufficient documentation

## 2018-07-31 DIAGNOSIS — K047 Periapical abscess without sinus: Secondary | ICD-10-CM | POA: Insufficient documentation

## 2018-07-31 LAB — CBC WITH DIFFERENTIAL/PLATELET
Abs Immature Granulocytes: 0.02 10*3/uL (ref 0.00–0.07)
BASOS ABS: 0 10*3/uL (ref 0.0–0.1)
Basophils Relative: 0 %
EOS ABS: 0.1 10*3/uL (ref 0.0–0.5)
Eosinophils Relative: 2 %
HCT: 50 % (ref 39.0–52.0)
Hemoglobin: 17.7 g/dL — ABNORMAL HIGH (ref 13.0–17.0)
Immature Granulocytes: 0 %
Lymphocytes Relative: 28 %
Lymphs Abs: 2.1 10*3/uL (ref 0.7–4.0)
MCH: 30 pg (ref 26.0–34.0)
MCHC: 35.4 g/dL (ref 30.0–36.0)
MCV: 84.7 fL (ref 80.0–100.0)
Monocytes Absolute: 0.5 10*3/uL (ref 0.1–1.0)
Monocytes Relative: 7 %
Neutro Abs: 4.7 10*3/uL (ref 1.7–7.7)
Neutrophils Relative %: 63 %
Platelets: 207 10*3/uL (ref 150–400)
RBC: 5.9 MIL/uL — AB (ref 4.22–5.81)
RDW: 11.9 % (ref 11.5–15.5)
WBC: 7.5 10*3/uL (ref 4.0–10.5)
nRBC: 0 % (ref 0.0–0.2)

## 2018-07-31 LAB — BASIC METABOLIC PANEL
Anion gap: 7 (ref 5–15)
BUN: 11 mg/dL (ref 6–20)
CO2: 23 mmol/L (ref 22–32)
CREATININE: 0.73 mg/dL (ref 0.61–1.24)
Calcium: 8.9 mg/dL (ref 8.9–10.3)
Chloride: 103 mmol/L (ref 98–111)
GFR calc Af Amer: >60 mL/min (ref >60)
GFR calc non Af Amer: >60 mL/min (ref >60)
Glucose, Bld: 370 mg/dL — ABNORMAL HIGH (ref 70–99)
Potassium: 3.3 mmol/L — ABNORMAL LOW (ref 3.5–5.1)
SODIUM: 133 mmol/L — AB (ref 135–145)

## 2018-07-31 LAB — CBG MONITORING, ED: GLUCOSE-CAPILLARY: 353 mg/dL — AB (ref 70–99)

## 2018-07-31 LAB — I-STAT CG4 LACTIC ACID, ED
LACTIC ACID, VENOUS: 2.33 mmol/L — AB (ref 0.5–1.9)
Lactic Acid, Venous: 1.26 mmol/L (ref 0.5–1.9)

## 2018-07-31 MED ORDER — FLUORESCEIN SODIUM 1 MG OP STRP
1.0000 | ORAL_STRIP | Freq: Once | OPHTHALMIC | Status: AC
  Administered 2018-07-31: 1 via OPHTHALMIC
  Filled 2018-07-31: qty 1

## 2018-07-31 MED ORDER — TETRACAINE HCL 0.5 % OP SOLN
2.0000 [drp] | Freq: Once | OPHTHALMIC | Status: AC
  Administered 2018-07-31: 2 [drp] via OPHTHALMIC
  Filled 2018-07-31: qty 4

## 2018-07-31 MED ORDER — IOHEXOL 300 MG/ML  SOLN
75.0000 mL | Freq: Once | INTRAMUSCULAR | Status: AC | PRN
  Administered 2018-07-31: 75 mL via INTRAVENOUS

## 2018-07-31 NOTE — ED Notes (Signed)
Called pt x2 for vitals, no response. °

## 2018-07-31 NOTE — ED Provider Notes (Signed)
TIME SEEN: 11:31 PM  CHIEF COMPLAINT: Bilateral eye pain  HPI: Patient is a 42 year old male with history of insulin-dependent diabetes who presents to the emergency department with 3 days of bilateral eye pain.  Started in the right eye and now is in the left as well.  Reports some blurry vision.  No injury to the eye that he is aware of.  No flashes, floaters or vision loss.  No redness, warmth or facial swelling noted.  He states he has had some sick contacts.  He has not had a dry cough and some sore throat as well.  Does not wear contacts or glasses.  No previous eye surgery.  No nausea, vomiting or diarrhea.  ROS: See HPI Constitutional: no fever  Eyes: no drainage  ENT: no runny nose   Cardiovascular:  no chest pain  Resp: no SOB  GI: no vomiting GU: no dysuria Integumentary: no rash  Allergy: no hives  Musculoskeletal: no leg swelling  Neurological: no slurred speech ROS otherwise negative  PAST MEDICAL HISTORY/PAST SURGICAL HISTORY:  Past Medical History:  Diagnosis Date  . Diabetes mellitus without complication (HCC)     MEDICATIONS:  Prior to Admission medications   Medication Sig Start Date End Date Taking? Authorizing Provider  ibuprofen (ADVIL,MOTRIN) 200 MG tablet Take 1,000 mg by mouth every 6 (six) hours as needed for moderate pain.   Yes [provider]  insulin NPH-regular Human (NOVOLIN 70/30) (70-30) 100 UNIT/ML injection Inject 40 Units into the skin daily with breakfast. Patient taking differently: Inject 30 Units into the skin daily.  02/15/14  Yes Marinda Elk, MD  insulin NPH-regular Human (NOVOLIN 70/30) (70-30) 100 UNIT/ML injection Inject 20 Units into the skin daily with supper. Patient not taking: Reported on 07/31/2018 02/15/14   Marinda Elk, MD    ALLERGIES:  No Known Allergies  SOCIAL HISTORY:  Social History   Tobacco Use  . Smoking status: Never Smoker  . Smokeless tobacco: Never Used  Substance Use Topics  .  Alcohol use: No    FAMILY HISTORY: No family history on file.  EXAM: BP (!) 177/99   Pulse 94   Temp 99.7 F (37.6 C) (Oral)   Resp 16   SpO2 99%  CONSTITUTIONAL: Alert and oriented and responds appropriately to questions. Well-appearing; well-nourished HEAD: Normocephalic EYES: Conjunctivae injected bilaterally.  Pupils are equal and reactive.  Normal extraocular movements.  No pain with consensual light response.  No floor seen uptake on examination.  Pressure in the right eye is 19 mmHg.  Pressure in the left eye is 17 mmHg.  See nursing notes for visual acuity.  Blurry vision in the right eye completely resolves after tetracaine.  Has a small amount of ecchymosis noted to the right eye.  Eyelids appear normal.  No foreign body appreciated.  No periorbital ecchymosis, swelling, redness or warmth.  Funduscopic exam limited. ENT: normal nose; moist mucous membranes; No pharyngeal erythema or petechiae, no tonsillar hypertrophy or exudate, no uvular deviation, no unilateral swelling, no trismus or drooling, no muffled voice, normal phonation, no stridor, some dental caries present, no drainable dental abscess noted, no Ludwig's angina, tongue sits flat in the bottom of the mouth, no angioedema, no facial erythema or warmth, no facial swelling; no pain with movement of the neck. NECK: Supple, no meningismus, no nuchal rigidity, no LAD  CARD: RRR; S1 and S2 appreciated; no murmurs, no clicks, no rubs, no gallops RESP: Normal chest excursion without splinting or tachypnea;  breath sounds clear and equal bilaterally; no wheezes, no rhonchi, no rales, no hypoxia or respiratory distress, speaking full sentences ABD/GI: Normal bowel sounds; non-distended; soft, non-tender, no rebound, no guarding, no peritoneal signs, no hepatosplenomegaly BACK:  The back appears normal and is non-tender to palpation, there is no CVA tenderness EXT: Normal ROM in all joints; non-tender to palpation; no edema; normal  capillary refill; no cyanosis, no calf tenderness or swelling    SKIN: Normal color for age and race; warm; no rash NEURO: Moves all extremities equally PSYCH: The patient's mood and manner are appropriate. Grooming and personal hygiene are appropriate.  MEDICAL DECISION MAKING: Patient here with what appears to be bilateral viral conjunctivitis.  There is no purulent drainage.  Nothing to suggest glaucoma.  He has no corneal abrasion or ulceration.  No foreign body.  No pain with consensual light response.  Doubt scleritis, uveitis.  Labs obtained in triage are unremarkable other than hyperglycemia.  No DKA.  Initial lactate was elevated but on repeat this has improved without intervention.  Have recommended close follow-up with his PCP for his elevated blood sugars.  CT of his orbits were obtained that showed no signs of cellulitis.  Will discharge on Polytrim drops and discharged with ophthalmology follow-up.  CT scan did incidentally note a periapical abscess at tooth #3.  No sign of Ludwig's angina.  No drainable abscess on exam.  Will discharge on amoxicillin and given outpatient dental resources.  At this time, I do not feel there is any life-threatening condition present. I have reviewed and discussed all results (EKG, imaging, lab, urine as appropriate) and exam findings with patient/family. I have reviewed nursing notes and appropriate previous records.  I feel the patient is safe to be discharged home without further emergent workup and can continue workup as an outpatient as needed. Discussed usual and customary return precautions. Patient/family verbalize understanding and are comfortable with this plan.  Outpatient follow-up has been provided if needed. All questions have been answered.      Rajvir Ernster, Layla Maw, DO 08/01/18 [phone]

## 2018-07-31 NOTE — ED Notes (Signed)
Dr. Donnald Garre aware Lactic 2.33

## 2018-07-31 NOTE — ED Triage Notes (Addendum)
Pt with right eye swelling and headache x2 days. He says his eye has been red for a month vision is blurry but he can see. He denies injury, but has had nausea with no vomiting. Hx of DM and does not take any blood pressure medication.

## 2018-08-01 MED ORDER — AMOXICILLIN 500 MG PO CAPS: 500.0000 mg | ORAL_CAPSULE | Freq: Three times a day (TID) | ORAL | 0 refills | Status: DC

## 2018-08-01 MED ORDER — POLYMYXIN B-TRIMETHOPRIM 10000-0.1 UNIT/ML-% OP SOLN: 2.0000 [drp] | Freq: Four times a day (QID) | OPHTHALMIC | 0 refills | Status: DC

## 2018-08-01 NOTE — Discharge Instructions (Addendum)
You may alternate Tylenol 1000 mg every 6 hours as needed for pain and Ibuprofen 800 mg every 8 hours as needed for pain.  Please take Ibuprofen with food.  Please follow-up with ophthalmology as an outpatient.  Please follow-up with a dentist.

## 2018-08-03 ENCOUNTER — Other Ambulatory Visit (HOSPITAL_COMMUNITY): Payer: Self-pay | Admitting: Ophthalmology

## 2018-08-03 DIAGNOSIS — H4901 Third [oculomotor] nerve palsy, right eye: Secondary | ICD-10-CM

## 2018-08-03 DIAGNOSIS — H5711 Ocular pain, right eye: Secondary | ICD-10-CM

## 2018-08-07 ENCOUNTER — Ambulatory Visit (HOSPITAL_COMMUNITY): Payer: Self-pay

## 2018-09-01 ENCOUNTER — Other Ambulatory Visit: Payer: Self-pay

## 2018-09-01 ENCOUNTER — Emergency Department (HOSPITAL_COMMUNITY): Payer: Self-pay

## 2018-09-01 ENCOUNTER — Encounter (HOSPITAL_COMMUNITY): Payer: Self-pay | Admitting: Emergency Medicine

## 2018-09-01 ENCOUNTER — Emergency Department (HOSPITAL_COMMUNITY)
Admission: EM | Admit: 2018-09-01 | Discharge: 2018-09-01 | Disposition: A | Discharge status: Home / Self Care | Payer: Self-pay | Attending: Emergency Medicine | Admitting: Emergency Medicine

## 2018-09-01 DIAGNOSIS — Z794 Long term (current) use of insulin: Secondary | ICD-10-CM | POA: Insufficient documentation

## 2018-09-01 DIAGNOSIS — H02401 Unspecified ptosis of right eyelid: Secondary | ICD-10-CM | POA: Insufficient documentation

## 2018-09-01 DIAGNOSIS — E119 Type 2 diabetes mellitus without complications: Secondary | ICD-10-CM | POA: Insufficient documentation

## 2018-09-01 LAB — I-STAT CHEM 8, ED
BUN: 14 mg/dL (ref 6–20)
Calcium, Ion: 1.16 mmol/L (ref 1.15–1.40)
Chloride: 103 mmol/L (ref 98–111)
Creatinine, Ser: 0.5 mg/dL — ABNORMAL LOW (ref 0.61–1.24)
Glucose, Bld: 307 mg/dL — ABNORMAL HIGH (ref 70–99)
HCT: 49 % (ref 39.0–52.0)
Hemoglobin: 16.7 g/dL (ref 13.0–17.0)
Potassium: 3.6 mmol/L (ref 3.5–5.1)
Sodium: 139 mmol/L (ref 135–145)
TCO2: 28 mmol/L (ref 22–32)

## 2018-09-01 MED ORDER — TETRACAINE HCL 0.5 % OP SOLN
1.0000 [drp] | Freq: Once | OPHTHALMIC | Status: AC
  Administered 2018-09-01: 2 [drp] via OPHTHALMIC
  Filled 2018-09-01: qty 4

## 2018-09-01 MED ORDER — IOHEXOL 300 MG/ML  SOLN
75.0000 mL | Freq: Once | INTRAMUSCULAR | Status: AC | PRN
  Administered 2018-09-01: 75 mL via INTRAVENOUS

## 2018-09-01 NOTE — Discharge Instructions (Addendum)
Please follow-up with neurologist and ophthalmologist for ongoing evaluation and management.  Return immediately if develop any new or worsening signs or symptoms.

## 2018-09-01 NOTE — ED Notes (Signed)
Discharge instructions discussed with Pt. Pt verbalized understanding. Pt stable and ambulatory.    

## 2018-09-01 NOTE — ED Triage Notes (Signed)
Pt c/o eye swelling x 1 month,  Reports being given eye drops recently at a clinic that have not helped.

## 2018-09-01 NOTE — ED Provider Notes (Signed)
[facility] EMERGENCY DEPARTMENT Provider Note   CSN: [id number] Arrival date & time: 09/01/18  1014   History   Chief Complaint Chief Complaint  Patient presents with  . Eye Problem    HPI Drew [name] is a 42 y.o. male.  HPI   42 year old male presents today with complaints of eye discomfort.  Patient is Licensed conveyancer was used.  He notes that he was seen originally on 07/31/2018 here in the emergency room.  Patient had conjunctival injection and discomfort at that time.  He was diagnosed with conjunctivitis at that time.  Patient did follow-up as an outpatient with ophthalmology as recommended.  Patient developed ptosis in the right eye with recommendation for CT scan of orbits and brain.  He presented to the emergency room today with a prescription for these imaging studies.  Patient notes that his vision out of his eye is normal but he cannot open the eye to see out.  He denies any associated neurological deficits.  He notes he was placed on pain medicine eyedrops by the ophthalmologist which improves the pain, he denies any significant discomfort at the time of my evaluation.  Patient denies any infectious etiology, distal neurological deficits or trauma to the eye.   Past Medical History:  Diagnosis Date  . Diabetes mellitus without complication [facility])     Patient Active Problem List   Diagnosis Date Noted  . DKA (diabetic ketoacidoses) (HCC) 02/14/2014  . Nausea with vomiting 02/14/2014    History reviewed. No pertinent surgical history.      Home Medications    Prior to Admission medications   Medication Sig Start Date End Date Taking? Authorizing Provider  amoxicillin (AMOXIL) 500 MG capsule Take 1 capsule (500 mg total) by mouth 3 (three) times daily. 08/01/18   Ward, Layla Maw, DO  ibuprofen (ADVIL,MOTRIN) 200 MG tablet Take 1,000 mg by mouth every 6 (six) hours as needed for moderate pain.    [provider]  insulin  NPH-regular Human (NOVOLIN 70/30) (70-30) 100 UNIT/ML injection Inject 40 Units into the skin daily with breakfast. Patient taking differently: Inject 30 Units into the skin daily.  02/15/14   Marinda Elk, MD  insulin NPH-regular Human (NOVOLIN 70/30) (70-30) 100 UNIT/ML injection Inject 20 Units into the skin daily with supper. Patient not taking: Reported on 07/31/2018 02/15/14   Marinda Elk, MD  trimethoprim-polymyxin b Joaquim Lai) ophthalmic solution Place 2 drops into both eyes every 6 (six) hours. For 5 days 08/01/18   Ward, Layla Maw, DO    Family History History reviewed. No pertinent family history.  Social History Social History   Tobacco Use  . Smoking status: Never Smoker  . Smokeless tobacco: Never Used  Substance Use Topics  . Alcohol use: No  . Drug use: No     Allergies   Patient has no known allergies.   Review of Systems Review of Systems  All other systems reviewed and are negative.    Physical Exam Updated Vital Signs BP (!) 154/95 (BP Location: Right Arm)   Pulse 94   Temp 98.2 F (36.8 C) (Oral)   Resp 16   Ht 5\' 5"  (1.651 m)   Wt 74.8 kg   SpO2 97%   BMI 27.46 kg/m   Physical Exam  Constitutional: He is oriented to person, place, and time. He appears well-developed and well-nourished.  HENT:  Head: Normocephalic and atraumatic.  Eyes: Pupils are equal, round, and reactive to light. Conjunctivae are  normal. Right eye exhibits no discharge. Left eye exhibits no discharge. No scleral icterus.  Ptosis noted over the right eyelid no surrounding erythema edema or edema, pupils equal round and reactive to light, extraocular movements intact and pain-free- minor conjunctival injection, no discharge- no sensory deficits   Vision: 20/25 left 20/26 right  Pressure: Right 19 Left 21   Neck: Normal range of motion. No JVD present. No tracheal deviation present.  Pulmonary/Chest: Effort normal. No stridor.  Neurological: He is alert and  oriented to person, place, and time. No sensory deficit. Coordination normal.  Bilateral upper and lower extremity sensation strength motor function intact  Psychiatric: He has a normal mood and affect. His behavior is normal. Judgment and thought content normal.  Nursing note and vitals reviewed.    ED Treatments / Results  Labs (all labs ordered are listed, but only abnormal results are displayed) Labs Reviewed  I-STAT CHEM 8, ED - Abnormal; Notable for the following components:      Result Value   Creatinine, Ser 0.50 (*)    Glucose, Bld 307 (*)    All other components within normal limits    EKG None  Radiology Ct Head Wo Contrast  Result Date: 09/01/2018 CLINICAL DATA:  Facial paralysis EXAM: CT HEAD WITHOUT CONTRAST CT ORBITS WITH CONTRAST TECHNIQUE: Multidetector CT imaging of the head was performed using the standard protocol without IV contrast. CT imaging of the orbits structures was performed with intravenous contrast. Multiplanar CT image reconstructions were also generated. CONTRAST:  75mL OMNIPAQUE IOHEXOL 300 MG/ML  SOLN COMPARISON:  Maxillofacial CT 07/31/2018 FINDINGS: CT HEAD FINDINGS Brain: No evidence of acute infarction, hemorrhage, hydrocephalus, extra-axial collection or mass lesion/mass effect. Vascular: No hyperdense vessel or unexpected calcification. Skull: Normal. Negative for fracture or focal lesion. Other: None. CT ORBITS Osseous: Intact Orbits: Intact orbits and globes. No retrobulbar mass. Symmetric appearance of the optic nerves and extraocular muscles. Sinuses: Minimal mucosal thickening of the maxillary sinuses. Soft tissues: Nonacute IMPRESSION: Normal head CT. Unremarkable CT of the orbits. Electronically Signed   By: Tollie Eth M.D.   On: 09/01/2018 14:37   Ct Orbits W Contrast  Result Date: 09/01/2018 CLINICAL DATA:  Facial paralysis EXAM: CT HEAD WITHOUT CONTRAST CT ORBITS WITH CONTRAST TECHNIQUE: Multidetector CT imaging of the head was performed  using the standard protocol without IV contrast. CT imaging of the orbits structures was performed with intravenous contrast. Multiplanar CT image reconstructions were also generated. CONTRAST:  75mL OMNIPAQUE IOHEXOL 300 MG/ML  SOLN COMPARISON:  Maxillofacial CT 07/31/2018 FINDINGS: CT HEAD FINDINGS Brain: No evidence of acute infarction, hemorrhage, hydrocephalus, extra-axial collection or mass lesion/mass effect. Vascular: No hyperdense vessel or unexpected calcification. Skull: Normal. Negative for fracture or focal lesion. Other: None. CT ORBITS Osseous: Intact Orbits: Intact orbits and globes. No retrobulbar mass. Symmetric appearance of the optic nerves and extraocular muscles. Sinuses: Minimal mucosal thickening of the maxillary sinuses. Soft tissues: Nonacute IMPRESSION: Normal head CT. Unremarkable CT of the orbits. Electronically Signed   By: Tollie Eth M.D.   On: 09/01/2018 14:37    Procedures Procedures (including critical care time)  Medications Ordered in ED Medications  tetracaine (PONTOCAINE) 0.5 % ophthalmic solution 1-2 drop (2 drops Both Eyes Given by Other 09/01/18 1430)  iohexol (OMNIPAQUE) 300 MG/ML solution 75 mL (75 mLs Intravenous Contrast Given 09/01/18 1413)     Initial Impression / Assessment and Plan / ED Course  I have reviewed the triage vital signs and the nursing notes.  Pertinent labs & imaging results that were available during my care of the patient were reviewed by me and considered in my medical decision making (see chart for details).     Labs:   Imaging: CT head without, CT orbits  Consults:  Therapeutics:  Discharge Meds:   Assessment/Plan: Dysuria male presents today with ptosis on his right eye.  Patient has very minor conjunctival injection.  He has been evaluated twice by ophthalmology per the patient.  He has notes at bedside showing his most recent evaluation on 11 4 with recommendations for CT orbits and CT head.  Patient did have imaging  study on 1 November, this was prior to the ptosis.  I do find it reasonable to proceed with imaging study to ensure no mass or infection.  Patient CT returned showing no abnormalities.  Uncertain etiology of the ptosis at this time as he has no other associated neurological deficits.  Patient does have care established with ophthalmology I encouraged him to contact ophthalmology and follow-up as needed.  Patient was also given a referral for neurology for further evaluation and management.  Patient had no further questions or concerns.  Interpreter was used throughout patient's evaluations.      Final Clinical Impressions(s) / ED Diagnoses   Final diagnoses:  Ptosis of right eyelid    ED Discharge Orders    None       Eyvonne MechanicHedges, Neveen Daponte, PA-C 09/02/18 0745    Sabas SousBero, Michael M, MD 09/02/18 1057

## 2018-09-01 NOTE — ED Notes (Signed)
Patient transported to CT 

## 2018-10-21 ENCOUNTER — Ambulatory Visit: Payer: MEDICAID | Admitting: Diagnostic Neuroimaging

## 2018-10-22 ENCOUNTER — Encounter: Payer: Self-pay | Admitting: Diagnostic Neuroimaging

## 2019-07-12 IMAGING — CT CT ORBITS W/ CM
3 of 7 series · 13 of 47 positions shown, 15 images · IV contrast (omnipaque)
Comparison: Maxillofacial CT 07/31/2018

CLINICAL DATA: Facial paralysis

EXAM:
CT HEAD WITHOUT CONTRAST
CT ORBITS WITH CONTRAST
TECHNIQUE: Multidetector CT imaging of the head was performed using the
standard protocol without IV contrast. CT imaging of the orbits
structures was performed with intravenous contrast. Multiplanar CT
image reconstructions were also generated.
CONTRAST:  75mL OMNIPAQUE IOHEXOL 300 MG/ML  SOLN

[Series 5: head bone · axial · 0.39mm/px · z∈[-113,+17]mm · 8 of 77 slices shown, 10 images]
[im 6/77  brain]
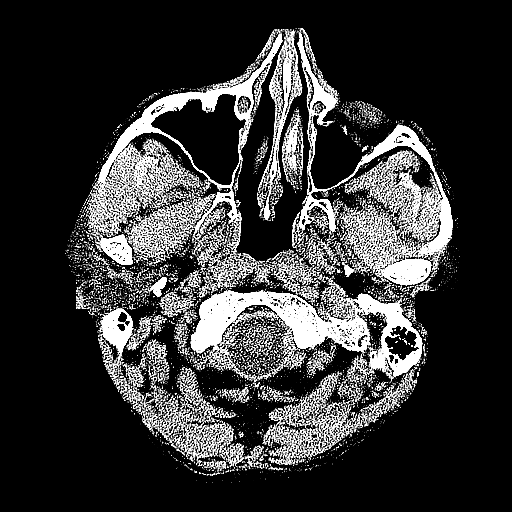
[im 6/77  bone]
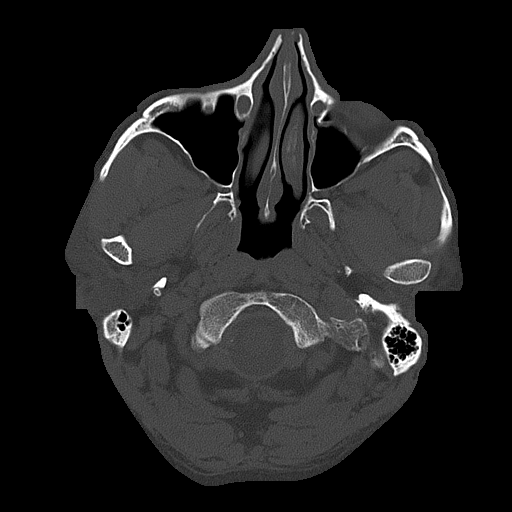
[im 16/77  bone]
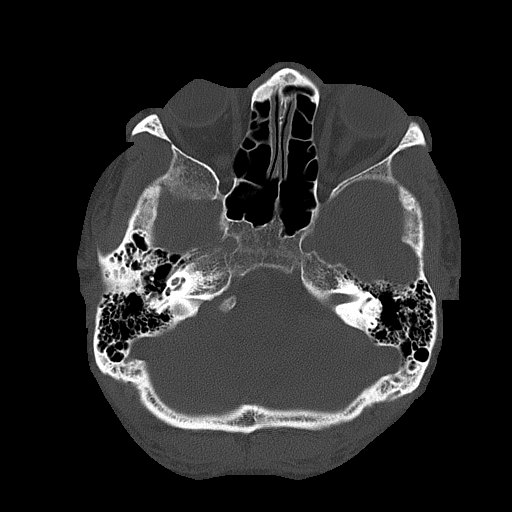
[im 26/77  bone]
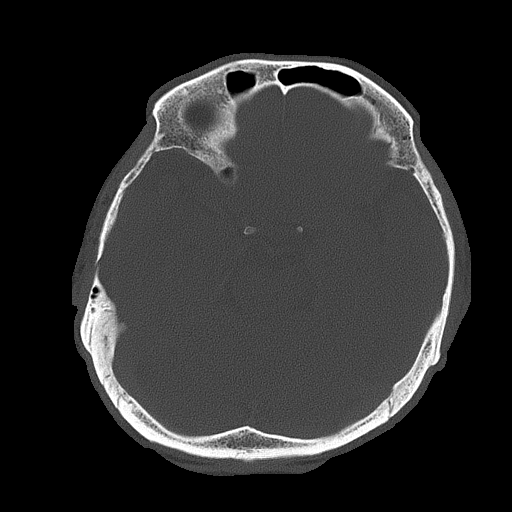
[im 36/77  bone]
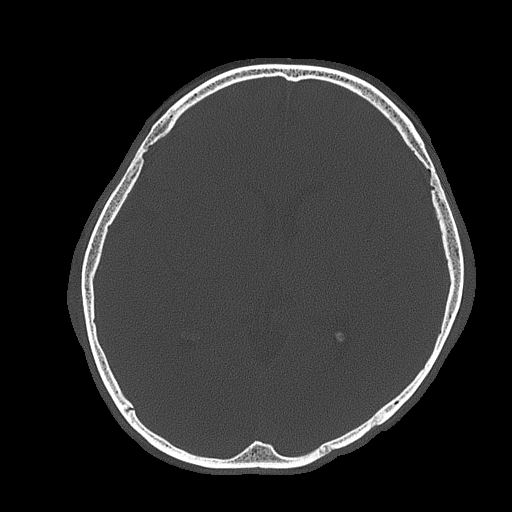
[im 41/77  brain]
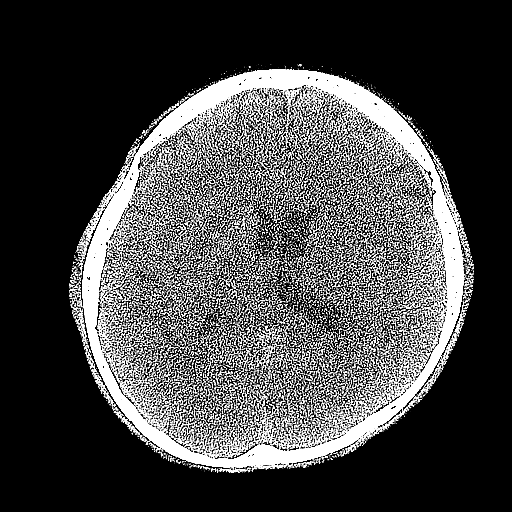
[im 41/77  bone]
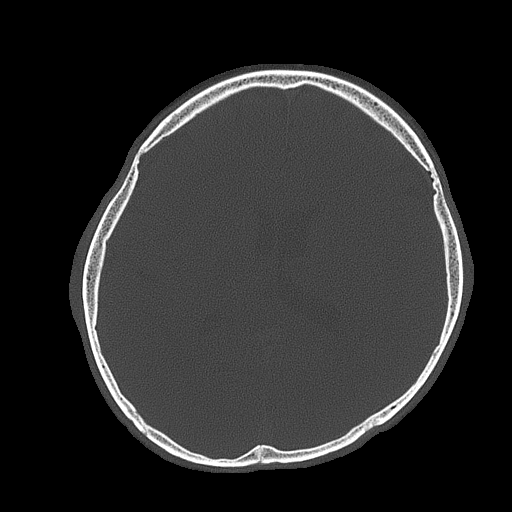
[im 51/77  bone]
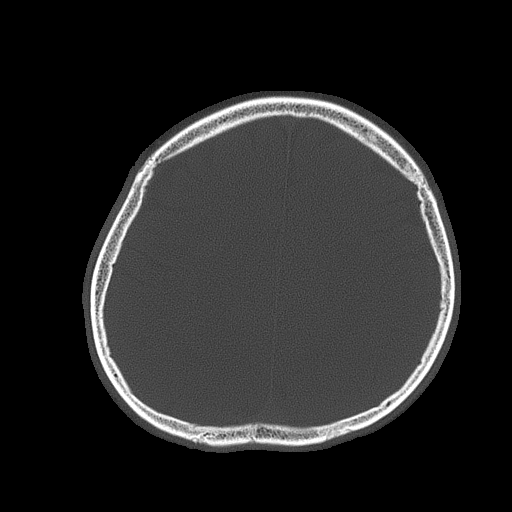
[im 61/77  bone]
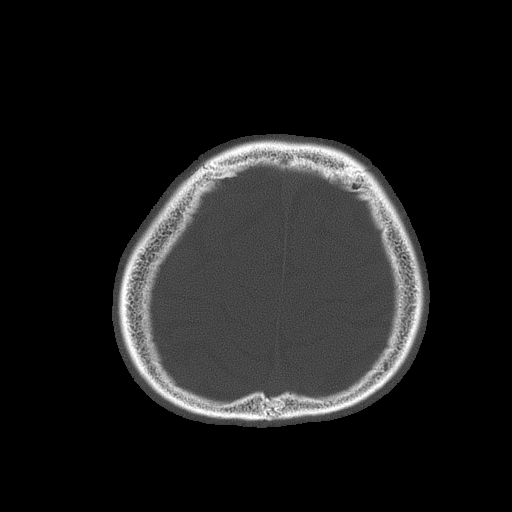
[im 71/77  bone]
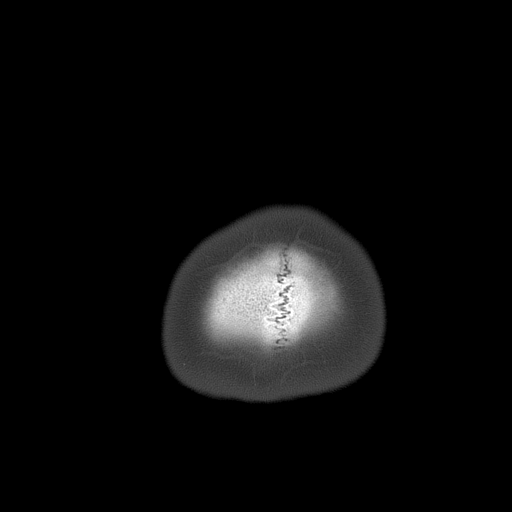

[Series 11: facialbone 2.0 cor st · coronal · 0.17mm/px · 3 of 76 slices shown]
[im 16/76  bone]
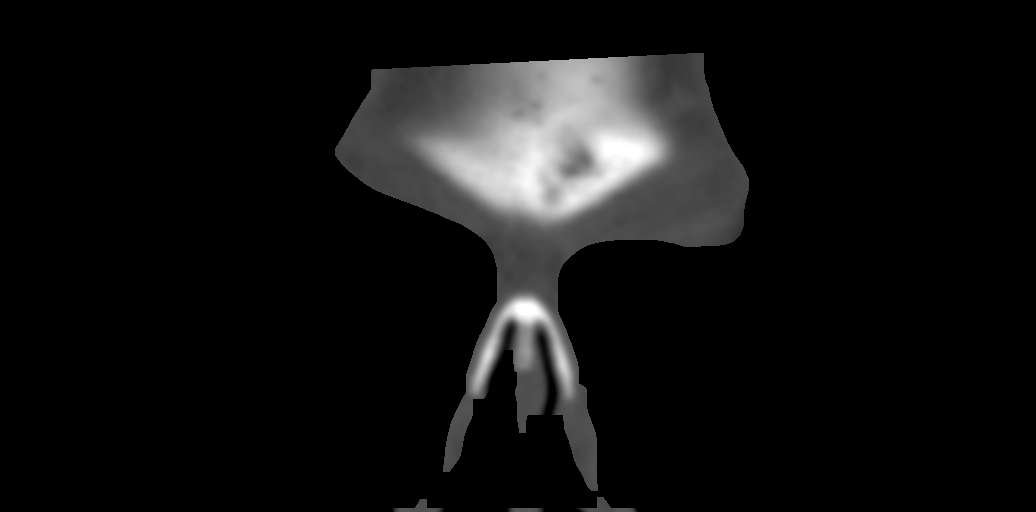
[im 31/76  bone]
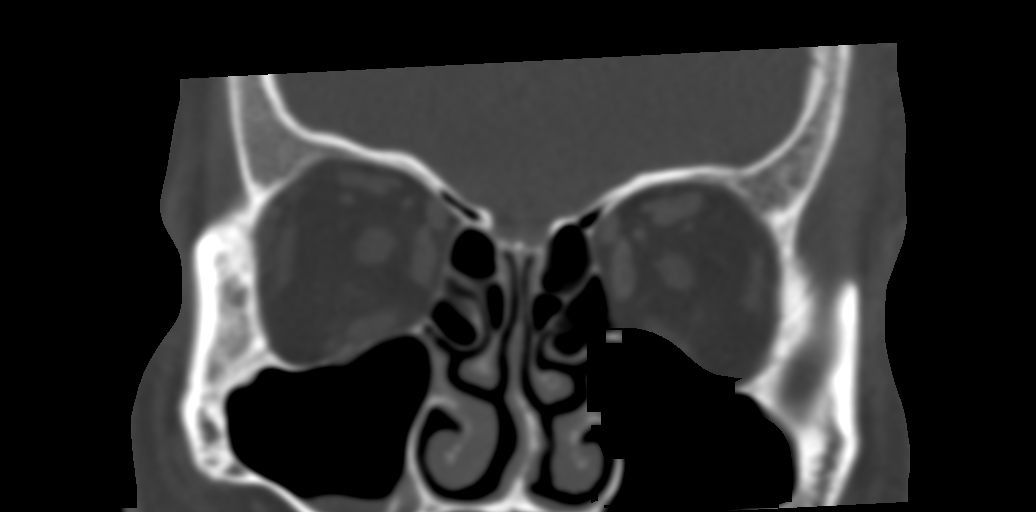
[im 46/76  bone]
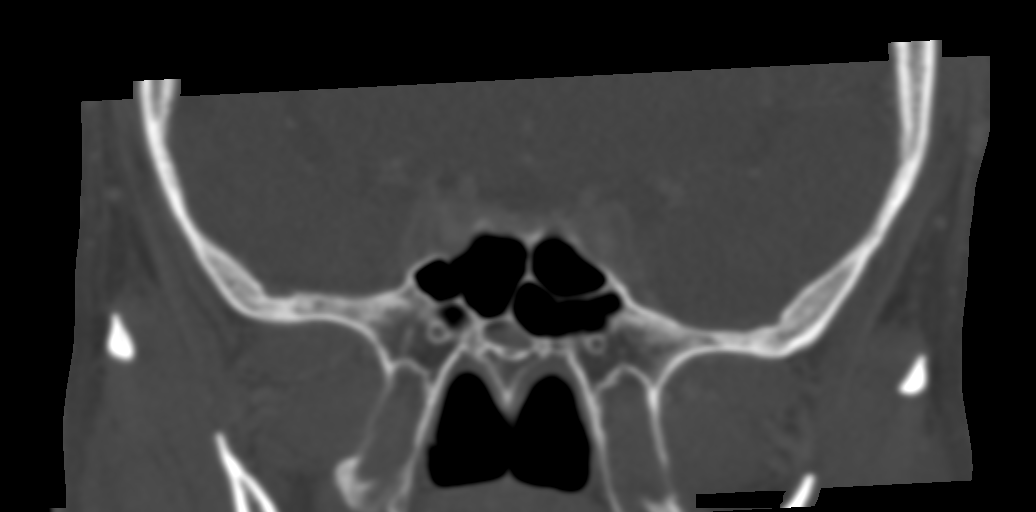

[Series 12: facialbone 2.0 sag st · sagittal · 0.16mm/px · 2 of 87 slices shown]
[im 29/87  bone]
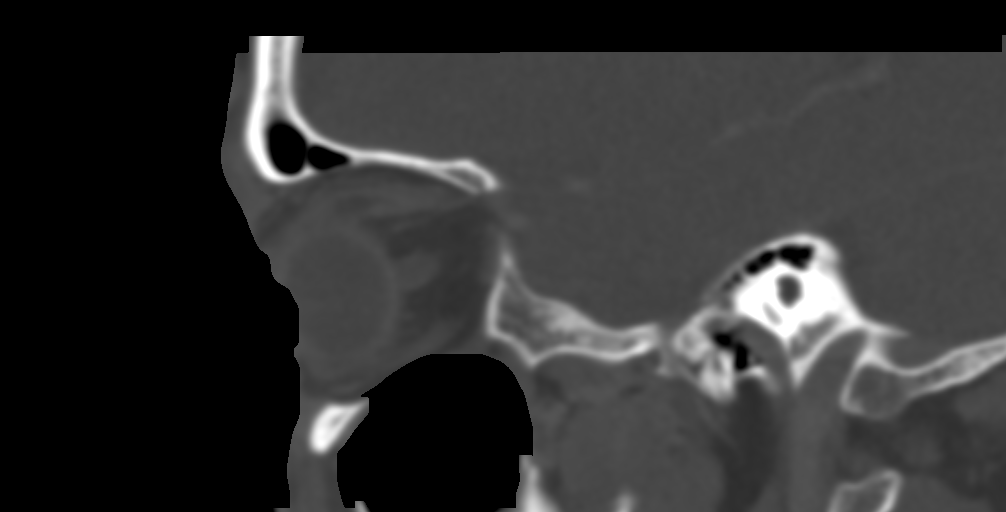
[im 58/87  bone]
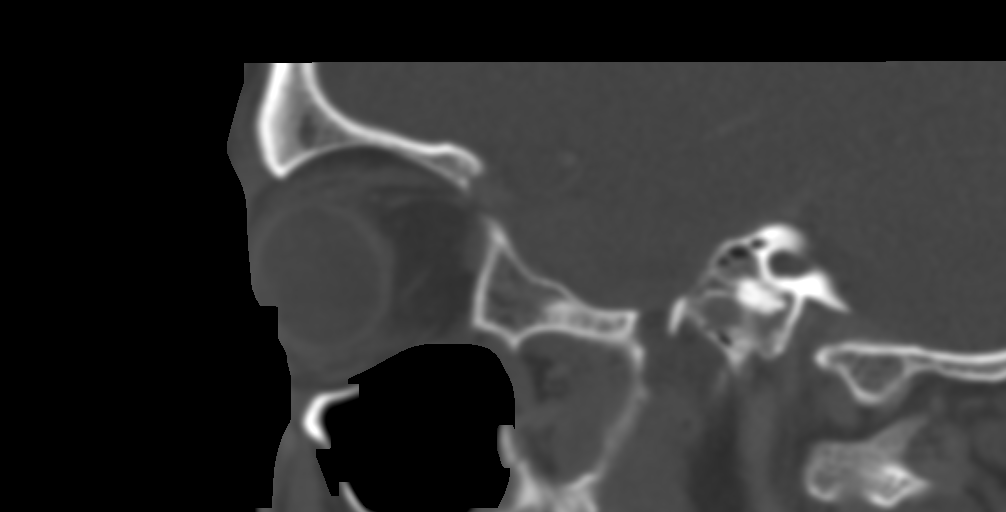

[13 of 47 positions shown; findings below may reference images not displayed]

FINDINGS: CT HEAD FINDINGS

Brain: No evidence of acute infarction, hemorrhage, hydrocephalus,
extra-axial collection or mass lesion/mass effect.

Vascular: No hyperdense vessel or unexpected calcification.

Skull: Normal. Negative for fracture or focal lesion.

Other: None.

CT ORBITS

Osseous: Intact

Orbits: Intact orbits and globes. No retrobulbar mass. Symmetric
appearance of the optic nerves and extraocular muscles.

Sinuses: Minimal mucosal thickening of the maxillary sinuses.

Soft tissues: Nonacute
IMPRESSION: Normal head CT.

Unremarkable CT of the orbits.

## 2020-06-02 ENCOUNTER — Emergency Department (HOSPITAL_COMMUNITY): Admission: EM | Admit: 2020-06-02 | Discharge: 2020-06-03 | Discharge status: Against Medical Advice | Payer: MEDICAID

## 2020-06-02 NOTE — ED Notes (Signed)
Called pt x 2 for triage with no response

## 2023-06-16 ENCOUNTER — Inpatient Hospital Stay (HOSPITAL_COMMUNITY)
Admission: EM | Admit: 2023-06-16 | Discharge: 2023-06-18 | DRG: 281 | Disposition: A | Discharge status: Home Health | Payer: Self-pay | Attending: Family Medicine | Admitting: Family Medicine

## 2023-06-16 ENCOUNTER — Other Ambulatory Visit: Payer: Self-pay

## 2023-06-16 ENCOUNTER — Emergency Department (HOSPITAL_COMMUNITY): Payer: Self-pay

## 2023-06-16 ENCOUNTER — Encounter (HOSPITAL_COMMUNITY): Payer: Self-pay

## 2023-06-16 DIAGNOSIS — Z79899 Other long term (current) drug therapy: Secondary | ICD-10-CM

## 2023-06-16 DIAGNOSIS — E785 Hyperlipidemia, unspecified: Secondary | ICD-10-CM | POA: Diagnosis present

## 2023-06-16 DIAGNOSIS — I160 Hypertensive urgency: Secondary | ICD-10-CM | POA: Insufficient documentation

## 2023-06-16 DIAGNOSIS — R519 Headache, unspecified: Secondary | ICD-10-CM | POA: Insufficient documentation

## 2023-06-16 DIAGNOSIS — E103591 Type 1 diabetes mellitus with proliferative diabetic retinopathy without macular edema, right eye: Secondary | ICD-10-CM | POA: Diagnosis present

## 2023-06-16 DIAGNOSIS — Z794 Long term (current) use of insulin: Secondary | ICD-10-CM

## 2023-06-16 DIAGNOSIS — I21A1 Myocardial infarction type 2: Secondary | ICD-10-CM | POA: Diagnosis present

## 2023-06-16 DIAGNOSIS — E1165 Type 2 diabetes mellitus with hyperglycemia: Secondary | ICD-10-CM

## 2023-06-16 DIAGNOSIS — H547 Unspecified visual loss: Secondary | ICD-10-CM

## 2023-06-16 DIAGNOSIS — H5462 Unqualified visual loss, left eye, normal vision right eye: Secondary | ICD-10-CM | POA: Diagnosis present

## 2023-06-16 DIAGNOSIS — Z603 Acculturation difficulty: Secondary | ICD-10-CM | POA: Diagnosis present

## 2023-06-16 DIAGNOSIS — H5440 Blindness, one eye, unspecified eye: Secondary | ICD-10-CM

## 2023-06-16 DIAGNOSIS — R739 Hyperglycemia, unspecified: Secondary | ICD-10-CM

## 2023-06-16 DIAGNOSIS — R079 Chest pain, unspecified: Secondary | ICD-10-CM

## 2023-06-16 DIAGNOSIS — I159 Secondary hypertension, unspecified: Principal | ICD-10-CM

## 2023-06-16 DIAGNOSIS — H4313 Vitreous hemorrhage, bilateral: Secondary | ICD-10-CM | POA: Diagnosis present

## 2023-06-16 DIAGNOSIS — N179 Acute kidney failure, unspecified: Secondary | ICD-10-CM | POA: Insufficient documentation

## 2023-06-16 DIAGNOSIS — I161 Hypertensive emergency: Principal | ICD-10-CM

## 2023-06-16 DIAGNOSIS — E11319 Type 2 diabetes mellitus with unspecified diabetic retinopathy without macular edema: Secondary | ICD-10-CM

## 2023-06-16 DIAGNOSIS — E1065 Type 1 diabetes mellitus with hyperglycemia: Secondary | ICD-10-CM | POA: Diagnosis present

## 2023-06-16 DIAGNOSIS — E860 Dehydration: Secondary | ICD-10-CM | POA: Diagnosis present

## 2023-06-16 DIAGNOSIS — I214 Non-ST elevation (NSTEMI) myocardial infarction: Secondary | ICD-10-CM

## 2023-06-16 DIAGNOSIS — E109 Type 1 diabetes mellitus without complications: Secondary | ICD-10-CM | POA: Insufficient documentation

## 2023-06-16 DIAGNOSIS — I6521 Occlusion and stenosis of right carotid artery: Secondary | ICD-10-CM | POA: Diagnosis present

## 2023-06-16 LAB — TROPONIN I (HIGH SENSITIVITY)
Troponin I (High Sensitivity): 48 ng/L — ABNORMAL HIGH (ref <18)
Troponin I (High Sensitivity): 54 ng/L — ABNORMAL HIGH (ref <18)

## 2023-06-16 LAB — I-STAT CHEM 8, ED
BUN: 18 mg/dL (ref 6–20)
Calcium, Ion: 1.09 mmol/L — ABNORMAL LOW (ref 1.15–1.40)
Chloride: 102 mmol/L (ref 98–111)
Creatinine, Ser: 1 mg/dL (ref 0.61–1.24)
Glucose, Bld: 507 mg/dL (ref 70–99)
HCT: 43 % (ref 39.0–52.0)
Hemoglobin: 14.6 g/dL (ref 13.0–17.0)
Potassium: 4.6 mmol/L (ref 3.5–5.1)
Sodium: 134 mmol/L — ABNORMAL LOW (ref 135–145)
TCO2: 22 mmol/L (ref 22–32)

## 2023-06-16 LAB — URINALYSIS, ROUTINE W REFLEX MICROSCOPIC
Bilirubin Urine: NEGATIVE
Glucose, UA: >=500 mg/dL — AB
Ketones, ur: NEGATIVE mg/dL
Nitrite: NEGATIVE
Protein, ur: 100 mg/dL — AB
Specific Gravity, Urine: 1.031 — ABNORMAL HIGH (ref 1.005–1.030)
pH: 5 (ref 5.0–8.0)

## 2023-06-16 LAB — BASIC METABOLIC PANEL
Anion gap: 18 — ABNORMAL HIGH (ref 5–15)
BUN: 14 mg/dL (ref 6–20)
CO2: 21 mmol/L — ABNORMAL LOW (ref 22–32)
Calcium: 9.4 mg/dL (ref 8.9–10.3)
Chloride: 95 mmol/L — ABNORMAL LOW (ref 98–111)
Creatinine, Ser: 1.07 mg/dL (ref 0.61–1.24)
GFR, Estimated: >60 mL/min (ref >60)
Glucose, Bld: 499 mg/dL — ABNORMAL HIGH (ref 70–99)
Potassium: 4.5 mmol/L (ref 3.5–5.1)
Sodium: 134 mmol/L — ABNORMAL LOW (ref 135–145)

## 2023-06-16 LAB — CBG MONITORING, ED
Glucose-Capillary: 510 mg/dL (ref 70–99)
Glucose-Capillary: 340 mg/dL — ABNORMAL HIGH (ref 70–99)

## 2023-06-16 LAB — RAPID URINE DRUG SCREEN, HOSP PERFORMED
Amphetamines: NOT DETECTED
Barbiturates: NOT DETECTED
Benzodiazepines: NOT DETECTED
Cocaine: NOT DETECTED
Opiates: NOT DETECTED
Tetrahydrocannabinol: NOT DETECTED

## 2023-06-16 LAB — MAGNESIUM: Magnesium: 2.2 mg/dL (ref 1.7–2.4)

## 2023-06-16 LAB — CBC
HCT: 43.3 % (ref 39.0–52.0)
Hemoglobin: 15.2 g/dL (ref 13.0–17.0)
MCH: 30.3 pg (ref 26.0–34.0)
MCHC: 35.1 g/dL (ref 30.0–36.0)
MCV: 86.4 fL (ref 80.0–100.0)
Platelets: 217 10*3/uL (ref 150–400)
RBC: 5.01 MIL/uL (ref 4.22–5.81)
RDW: 12 % (ref 11.5–15.5)
WBC: 6.1 10*3/uL (ref 4.0–10.5)
nRBC: 0 % (ref 0.0–0.2)

## 2023-06-16 LAB — OSMOLALITY: Osmolality: 320 mosm/kg — ABNORMAL HIGH (ref 275–295)

## 2023-06-16 LAB — HEPATIC FUNCTION PANEL
ALT: 33 U/L (ref 0–44)
AST: 25 U/L (ref 15–41)
Albumin: 3.4 g/dL — ABNORMAL LOW (ref 3.5–5.0)
Alkaline Phosphatase: 150 U/L — ABNORMAL HIGH (ref 38–126)
Bilirubin, Direct: 0.2 mg/dL (ref 0.0–0.2)
Indirect Bilirubin: 0.6 mg/dL (ref 0.3–0.9)
Total Bilirubin: 0.8 mg/dL (ref 0.3–1.2)
Total Protein: 7.7 g/dL (ref 6.5–8.1)

## 2023-06-16 LAB — BETA-HYDROXYBUTYRIC ACID: Beta-Hydroxybutyric Acid: 0.09 mmol/L (ref 0.05–0.27)

## 2023-06-16 LAB — LIPASE, BLOOD: Lipase: 43 U/L (ref 11–51)

## 2023-06-16 MED ORDER — INSULIN ASPART 100 UNIT/ML IJ SOLN
10.0000 [IU] | Freq: Once | INTRAMUSCULAR | Status: AC
  Administered 2023-06-16: 10 [IU] via SUBCUTANEOUS

## 2023-06-16 MED ORDER — DIPHENHYDRAMINE HCL 50 MG/ML IJ SOLN
12.5000 mg | Freq: Once | INTRAMUSCULAR | Status: AC
  Administered 2023-06-16: 12.5 mg via INTRAVENOUS
  Filled 2023-06-16: qty 1

## 2023-06-16 MED ORDER — NITROGLYCERIN 2 % TD OINT
1.0000 [in_us] | TOPICAL_OINTMENT | Freq: Once | TRANSDERMAL | Status: DC
  Filled 2023-06-16: qty 1

## 2023-06-16 MED ORDER — IOHEXOL 350 MG/ML SOLN
75.0000 mL | Freq: Once | INTRAVENOUS | Status: AC | PRN
  Administered 2023-06-16: 75 mL via INTRAVENOUS

## 2023-06-16 MED ORDER — METOPROLOL TARTRATE 25 MG PO TABS
25.0000 mg | ORAL_TABLET | Freq: Once | ORAL | Status: AC
  Administered 2023-06-16: 25 mg via ORAL
  Filled 2023-06-16: qty 1

## 2023-06-16 MED ORDER — ASPIRIN 81 MG PO CHEW
324.0000 mg | CHEWABLE_TABLET | Freq: Once | ORAL | Status: AC
  Administered 2023-06-16: 324 mg via ORAL
  Filled 2023-06-16: qty 4

## 2023-06-16 MED ORDER — LACTATED RINGERS IV BOLUS
2000.0000 mL | Freq: Once | INTRAVENOUS | Status: AC
  Administered 2023-06-16: 2000 mL via INTRAVENOUS

## 2023-06-16 MED ORDER — METOCLOPRAMIDE HCL 5 MG/ML IJ SOLN
10.0000 mg | Freq: Once | INTRAMUSCULAR | Status: AC
  Administered 2023-06-16: 10 mg via INTRAVENOUS
  Filled 2023-06-16: qty 2

## 2023-06-16 NOTE — ED Notes (Signed)
Pt to xray

## 2023-06-16 NOTE — ED Provider Notes (Signed)
[place] EMERGENCY DEPARTMENT AT [facility] Provider Note   CSN: [id number] Arrival date & time: 06/16/23  1540     History  Chief Complaint  Patient presents with   Eye Problem   Headache   Hypertension   Chest Pain    Drew [name] is a 47 y.o. male.  The history is provided by the patient. The history is limited by a language barrier. A language interpreter was used.  Eye Problem Associated symptoms: headaches   Headache Hypertension Associated symptoms include chest pain and headaches.  Chest Pain Associated symptoms: headache   Patient presents for headache and decreased vision.  Medical history includes DM.  He does take insulin.  Last dose of insulin was this morning.  Patient reports complete loss of vision in his right eye for the past month.  He has had decreased vision in his left eye over that same timeframe.  He states that he went to the eye doctor this morning and was told that there is nothing they can do.  He presents to the ED for further evaluation.  He endorses a generalized headache.  He denies any other areas of pain.      Home Medications Prior to Admission medications   Medication Sig Start Date End Date Taking? Authorizing Provider  ibuprofen (ADVIL,MOTRIN) 200 MG tablet Take 1,000 mg by mouth every 6 (six) hours as needed for moderate pain.   Yes [provider]  insulin NPH-regular Human (NOVOLIN 70/30) (70-30) 100 UNIT/ML injection Inject 40 Units into the skin daily with breakfast. Patient taking differently: Inject 30 Units into the skin daily. 02/15/14  Yes Marinda Elk, MD  amoxicillin (AMOXIL) 500 MG capsule Take 1 capsule (500 mg total) by mouth 3 (three) times daily. Patient not taking: Reported on 06/16/2023 08/01/18   Ward, Layla Maw, DO  trimethoprim-polymyxin b (POLYTRIM) ophthalmic solution Place 2 drops into both eyes every 6 (six) hours. For 5 days Patient not taking: Reported on 06/16/2023 08/01/18    Ward, Layla Maw, DO      Allergies    Patient has no known allergies.    Review of Systems   Review of Systems  Eyes:  Positive for visual disturbance.  Cardiovascular:  Positive for chest pain.  Neurological:  Positive for headaches.  All other systems reviewed and are negative.   Physical Exam Updated Vital Signs BP (!) 208/111   Pulse (!) 102   Temp 98.3 F (36.8 C)   Resp 17   Ht 5' 2.99" (1.6 m)   Wt 60 kg   SpO2 100%   BMI 23.44 kg/m  Physical Exam Vitals and nursing note reviewed.  Constitutional:      General: He is not in acute distress.    Appearance: He is well-developed. He is not ill-appearing, toxic-appearing or diaphoretic.  HENT:     Head: Normocephalic and atraumatic.     Mouth/Throat:     Mouth: Mucous membranes are moist.  Eyes:     Extraocular Movements: Extraocular movements intact.     Conjunctiva/sclera: Conjunctivae normal.     Comments: Loss of vision in right eye.  Able to count fingers in left eye.  Cardiovascular:     Rate and Rhythm: Normal rate and regular rhythm.  Pulmonary:     Effort: Pulmonary effort is normal. No respiratory distress.  Abdominal:     General: There is no distension.     Palpations: Abdomen is soft.     Tenderness: There  is no abdominal tenderness.  Musculoskeletal:        General: No swelling.     Cervical back: Normal range of motion and neck supple.  Skin:    General: Skin is warm and dry.  Neurological:     Mental Status: He is alert and oriented to person, place, and time.     Cranial Nerves: No dysarthria or facial asymmetry.     Sensory: No sensory deficit.     Motor: No weakness.  Psychiatric:        Mood and Affect: Mood normal. Affect is flat.        Speech: Speech normal.        Behavior: Behavior is slowed and withdrawn. Behavior is cooperative.     ED Results / Procedures / Treatments   Labs (all labs ordered are listed, but only abnormal results are displayed) Labs Reviewed  BASIC  METABOLIC PANEL - Abnormal; Notable for the following components:      Result Value   Sodium 134 (*)    Chloride 95 (*)    CO2 21 (*)    Glucose, Bld 499 (*)    Anion gap 18 (*)    All other components within normal limits  URINALYSIS, ROUTINE W REFLEX MICROSCOPIC - Abnormal; Notable for the following components:   Specific Gravity, Urine 1.031 (*)    Glucose, UA >=500 (*)    Hgb urine dipstick MODERATE (*)    Protein, ur 100 (*)    Leukocytes,Ua SMALL (*)    Bacteria, UA RARE (*)    All other components within normal limits  HEPATIC FUNCTION PANEL - Abnormal; Notable for the following components:   Albumin 3.4 (*)    Alkaline Phosphatase 150 (*)    All other components within normal limits  OSMOLALITY - Abnormal; Notable for the following components:   Osmolality 320 (*)    All other components within normal limits  CBG MONITORING, ED - Abnormal; Notable for the following components:   Glucose-Capillary 510 (*)    All other components within normal limits  I-STAT CHEM 8, ED - Abnormal; Notable for the following components:   Sodium 134 (*)    Glucose, Bld 507 (*)    Calcium, Ion 1.09 (*)    All other components within normal limits  CBG MONITORING, ED - Abnormal; Notable for the following components:   Glucose-Capillary 340 (*)    All other components within normal limits  TROPONIN I (HIGH SENSITIVITY) - Abnormal; Notable for the following components:   Troponin I (High Sensitivity) 48 (*)    All other components within normal limits  TROPONIN I (HIGH SENSITIVITY) - Abnormal; Notable for the following components:   Troponin I (High Sensitivity) 54 (*)    All other components within normal limits  CBC  BETA-HYDROXYBUTYRIC ACID  MAGNESIUM  LIPASE, BLOOD  RAPID URINE DRUG SCREEN, HOSP PERFORMED    EKG EKG Interpretation Date/Time:  Monday June 16 2023 18:48:15 EDT Ventricular Rate:  103 PR Interval:  141 QRS Duration:  81 QT Interval:  334 QTC  Calculation: 438 R Axis:   61  Text Interpretation: Sinus tachycardia Confirmed by Gloris Manchester [phone]) on 06/16/2023 6:51:26 PM  Radiology DG Chest 2 View  Result Date: 06/16/2023 CLINICAL DATA:  CP EXAM: CHEST - 2 VIEW COMPARISON:  02/14/2014. FINDINGS: The heart size and mediastinal contours are within normal limits. Both lungs are clear. The visualized skeletal structures are unremarkable. IMPRESSION: No acute cardiopulmonary disease. Electronically Signed  By: Layla Maw M.D.   On: 06/16/2023 19:17    Procedures Procedures    Medications Ordered in ED Medications  metoprolol tartrate (LOPRESSOR) tablet 25 mg (has no administration in time range)  nitroGLYCERIN (NITROGLYN) 2 % ointment 1 inch (has no administration in time range)  insulin aspart (novoLOG) injection 10 Units (has no administration in time range)  lactated ringers bolus 2,000 mL (0 mLs Intravenous Stopped 06/16/23 2329)  metoCLOPramide (REGLAN) injection 10 mg (10 mg Intravenous Given 06/16/23 2035)  diphenhydrAMINE (BENADRYL) injection 12.5 mg (12.5 mg Intravenous Given 06/16/23 2035)  aspirin chewable tablet 324 mg (324 mg Oral Given 06/16/23 2034)  iohexol (OMNIPAQUE) 350 MG/ML injection 75 mL (75 mLs Intravenous Contrast Given 06/16/23 2219)    ED Course/ Medical Decision Making/ A&P Clinical Course as of 06/16/23 2330  Mon Jun 16, 2023  2026 EKG 12-Lead [RD]    Clinical Course User Index [RD] Gloris Manchester, MD                                 Medical Decision Making Amount and/or Complexity of Data Reviewed Labs: ordered. Radiology: ordered. ECG/medicine tests:  Decision-making details documented in ED Course.  Risk OTC drugs. Prescription drug management.   This patient presents to the ED for concern of headache and visual loss, this involves an extensive number of treatment options, and is a complaint that carries with it a high risk of complications and morbidity.  The differential diagnosis  includes diabetic retinopathy, vitreous hemorrhage, retinal detachment, CVA, metabolic derangements, migraine   Co morbidities that complicate the patient evaluation  DM   Additional history obtained:  Additional history obtained from N/A External records from outside source obtained and reviewed including EMR   Lab Tests:  I Ordered, and personally interpreted labs.  The pertinent results include: Hyperglycemia is present.  Although there is an anion gap, beta hydroxybutyrate is normal.  Hemoglobin is normal and no leukocytosis present.  Urinalysis is equivocal.  Troponin is mildly elevated.   Imaging Studies ordered:  I ordered imaging studies including chest x-ray, CT head, CTA head and neck I independently visualized and interpreted imaging which showed acute findings on x-ray, CT imaging pending at time of signout. I agree with the radiologist interpretation   Cardiac Monitoring: / EKG:  The patient was maintained on a cardiac monitor.  I personally viewed and interpreted the cardiac monitored which showed an underlying rhythm of: Sinus rhythm   Consultations Obtained:  I requested consultation with the ophthalmologist, Dr. Zenaida Niece,  and discussed lab and imaging findings as well as pertinent plan - they recommend: No acute management, she is happy to see him in the office.   Problem List / ED Course / Critical interventions / Medication management  Patient presents for 1 month of severe visual loss.  This includes complete visual loss in his right eye and severely diminished visual acuity in his left eye.  Vital signs on arrival are notable for hypertension and tachycardia.  He states that he does not take any blood pressure medications.  Initial lab work is notable for glucose of 507.  He does not have ketonuria.  Patient reports that he does not typically check his blood sugars at home.  IV fluids were ordered for hydration.  Per chart review, he was [facility]  ophthalmology today.  From note, seems that they are suspecting proliferative diabetic retinopathy.  He was counseled  on possible treatments.  They did encourage further opinion.  I spoke with ophthalmologist on-call, Dr. Marshall Cork, who would be happy to see him in the office.  She does not recommend any acute management tonight.  Patient's initial troponin was elevated.  ASA was ordered.  When speaking with him further, he does state that he did have chest pain earlier today.  This has since resolved.  On further conversation, he states that he has had intermittent chest pain over the past week that is worsened with exertion.  Patient does describe unstable angina.  Second troponin showed very slight increase.  Cardiology was consulted.  Although patient was blood pressure improved, it further elevated while he was in the ED.  NTG and metoprolol were ordered.  After IV fluids, sugar improved to 300s.  Correction dose of insulin was ordered.  Care of patient was signed to oncoming ED provider. I ordered medication including IV fluids and insulin for hyperglycemia; Reglan and Benadryl for headache; ASA for concern of ACS; NTG and metoprolol for hypertension Reevaluation of the patient after these medicines showed that the patient improved I have reviewed the patients home medicines and have made adjustments as needed   Social Determinants of Health:  Non-English-speaking, does not have PCP        Final Clinical Impression(s) / ED Diagnoses Final diagnoses:  Secondary hypertension  Chest pain, unspecified type  Hyperglycemia  Visual loss    Rx / DC Orders ED Discharge Orders     None         Gloris Manchester, MD 06/16/23 2330

## 2023-06-16 NOTE — ED Notes (Signed)
Pt did not answer in lobby when called to triage.

## 2023-06-16 NOTE — ED Triage Notes (Addendum)
Pt came in via POV d/t vision problems that started 3 months ago, he reports seeing flashes of light & it is getting very hard to see, endorses photosensitivity. Also reports he is a diabetes & does not check his CBG routinely, last check his CBG 15 days ago & reports it was almost 200. Also endorses some CP while in triage, no radiation. Also has a 10/10 HA, A/Ox4.

## 2023-06-17 ENCOUNTER — Inpatient Hospital Stay (HOSPITAL_COMMUNITY): Payer: Self-pay

## 2023-06-17 ENCOUNTER — Encounter (HOSPITAL_COMMUNITY): Payer: Self-pay | Admitting: Internal Medicine

## 2023-06-17 DIAGNOSIS — H5440 Blindness, one eye, unspecified eye: Secondary | ICD-10-CM

## 2023-06-17 DIAGNOSIS — I214 Non-ST elevation (NSTEMI) myocardial infarction: Secondary | ICD-10-CM

## 2023-06-17 DIAGNOSIS — E11319 Type 2 diabetes mellitus with unspecified diabetic retinopathy without macular edema: Secondary | ICD-10-CM

## 2023-06-17 DIAGNOSIS — E1065 Type 1 diabetes mellitus with hyperglycemia: Secondary | ICD-10-CM

## 2023-06-17 DIAGNOSIS — E1165 Type 2 diabetes mellitus with hyperglycemia: Secondary | ICD-10-CM

## 2023-06-17 DIAGNOSIS — I160 Hypertensive urgency: Secondary | ICD-10-CM | POA: Insufficient documentation

## 2023-06-17 DIAGNOSIS — N179 Acute kidney failure, unspecified: Secondary | ICD-10-CM | POA: Insufficient documentation

## 2023-06-17 DIAGNOSIS — R519 Headache, unspecified: Secondary | ICD-10-CM | POA: Insufficient documentation

## 2023-06-17 DIAGNOSIS — E109 Type 1 diabetes mellitus without complications: Secondary | ICD-10-CM | POA: Insufficient documentation

## 2023-06-17 DIAGNOSIS — I161 Hypertensive emergency: Secondary | ICD-10-CM

## 2023-06-17 DIAGNOSIS — G44201 Tension-type headache, unspecified, intractable: Secondary | ICD-10-CM

## 2023-06-17 DIAGNOSIS — H53132 Sudden visual loss, left eye: Secondary | ICD-10-CM

## 2023-06-17 LAB — COMPREHENSIVE METABOLIC PANEL
ALT: 26 U/L (ref 0–44)
AST: 20 U/L (ref 15–41)
Albumin: 2.9 g/dL — ABNORMAL LOW (ref 3.5–5.0)
Alkaline Phosphatase: 105 U/L (ref 38–126)
Anion gap: 6 (ref 5–15)
BUN: 12 mg/dL (ref 6–20)
CO2: 24 mmol/L (ref 22–32)
Calcium: 8.4 mg/dL — ABNORMAL LOW (ref 8.9–10.3)
Chloride: 107 mmol/L (ref 98–111)
Creatinine, Ser: 0.66 mg/dL (ref 0.61–1.24)
GFR, Estimated: >60 mL/min (ref >60)
Glucose, Bld: 198 mg/dL — ABNORMAL HIGH (ref 70–99)
Potassium: 3 mmol/L — ABNORMAL LOW (ref 3.5–5.1)
Sodium: 137 mmol/L (ref 135–145)
Total Bilirubin: 1 mg/dL (ref 0.3–1.2)
Total Protein: 6.5 g/dL (ref 6.5–8.1)

## 2023-06-17 LAB — ECHOCARDIOGRAM COMPLETE
AR max vel: 2.41 cm2
AV Peak grad: 6.1 mmHg
Ao pk vel: 1.23 m/s
Area-P 1/2: 3.89 cm2
Height: 62.992 in
S' Lateral: 3 cm
Weight: 2116.42 [oz_av]

## 2023-06-17 LAB — LIPID PANEL
Cholesterol: 156 mg/dL (ref 0–200)
HDL: 43 mg/dL (ref >40)
LDL Cholesterol: 77 mg/dL (ref 0–99)
Total CHOL/HDL Ratio: 3.6 ratio
Triglycerides: 181 mg/dL — ABNORMAL HIGH (ref <150)
VLDL: 36 mg/dL (ref 0–40)

## 2023-06-17 LAB — HIV ANTIBODY (ROUTINE TESTING W REFLEX): HIV Screen 4th Generation wRfx: NONREACTIVE

## 2023-06-17 LAB — CBC
HCT: 37.9 % — ABNORMAL LOW (ref 39.0–52.0)
Hemoglobin: 13.9 g/dL (ref 13.0–17.0)
MCH: 31.1 pg (ref 26.0–34.0)
MCHC: 36.7 g/dL — ABNORMAL HIGH (ref 30.0–36.0)
MCV: 84.8 fL (ref 80.0–100.0)
Platelets: 195 10*3/uL (ref 150–400)
RBC: 4.47 MIL/uL (ref 4.22–5.81)
RDW: 12.2 % (ref 11.5–15.5)
WBC: 7.5 10*3/uL (ref 4.0–10.5)
nRBC: 0 % (ref 0.0–0.2)

## 2023-06-17 LAB — CBG MONITORING, ED
Glucose-Capillary: 181 mg/dL — ABNORMAL HIGH (ref 70–99)
Glucose-Capillary: 264 mg/dL — ABNORMAL HIGH (ref 70–99)
Glucose-Capillary: 192 mg/dL — ABNORMAL HIGH (ref 70–99)

## 2023-06-17 LAB — TROPONIN I (HIGH SENSITIVITY)
Troponin I (High Sensitivity): 57 ng/L — ABNORMAL HIGH (ref <18)
Troponin I (High Sensitivity): 57 ng/L — ABNORMAL HIGH (ref <18)

## 2023-06-17 LAB — HEMOGLOBIN A1C
Hgb A1c MFr Bld: 9.9 % — ABNORMAL HIGH (ref 4.8–5.6)
Mean Plasma Glucose: 237.43 mg/dL

## 2023-06-17 LAB — GLUCOSE, CAPILLARY
Glucose-Capillary: 203 mg/dL — ABNORMAL HIGH (ref 70–99)
Glucose-Capillary: 127 mg/dL — ABNORMAL HIGH (ref 70–99)

## 2023-06-17 MED ORDER — AMLODIPINE BESYLATE 5 MG PO TABS
5.0000 mg | ORAL_TABLET | Freq: Every day | ORAL | Status: DC
  Administered 2023-06-17 – 2023-06-18 (×2): 5 mg via ORAL
  Filled 2023-06-17 (×2): qty 1

## 2023-06-17 MED ORDER — ACETAMINOPHEN 650 MG RE SUPP: 650.0000 mg | Freq: Four times a day (QID) | RECTAL | Status: DC | PRN

## 2023-06-17 MED ORDER — INSULIN ASPART PROT & ASPART (70-30 MIX) 100 UNIT/ML ~~LOC~~ SUSP: 40.0000 [IU] | Freq: Two times a day (BID) | SUBCUTANEOUS | Status: DC

## 2023-06-17 MED ORDER — SODIUM CHLORIDE 0.9 % IV SOLN: 250.0000 mL | INTRAVENOUS | Status: DC | PRN

## 2023-06-17 MED ORDER — ATORVASTATIN CALCIUM 10 MG PO TABS
20.0000 mg | ORAL_TABLET | Freq: Every day | ORAL | Status: DC
  Administered 2023-06-17 – 2023-06-18 (×2): 20 mg via ORAL
  Filled 2023-06-17 (×2): qty 2

## 2023-06-17 MED ORDER — SODIUM CHLORIDE 0.9% FLUSH
3.0000 mL | Freq: Two times a day (BID) | INTRAVENOUS | Status: DC
  Administered 2023-06-17 – 2023-06-18 (×3): 3 mL via INTRAVENOUS

## 2023-06-17 MED ORDER — INSULIN ASPART 100 UNIT/ML IJ SOLN
0.0000 [IU] | Freq: Three times a day (TID) | INTRAMUSCULAR | Status: DC
  Administered 2023-06-17: 2 [IU] via SUBCUTANEOUS
  Administered 2023-06-17: 1 [IU] via SUBCUTANEOUS
  Administered 2023-06-17 – 2023-06-18 (×2): 3 [IU] via SUBCUTANEOUS

## 2023-06-17 MED ORDER — INSULIN ASPART PROT & ASPART (70-30 MIX) 100 UNIT/ML ~~LOC~~ SUSP
40.0000 [IU] | SUBCUTANEOUS | Status: DC
Start: 2023-06-17 — End: 2023-06-17

## 2023-06-17 MED ORDER — CARVEDILOL 3.125 MG PO TABS
3.1250 mg | ORAL_TABLET | Freq: Two times a day (BID) | ORAL | Status: DC
  Administered 2023-06-17 – 2023-06-18 (×3): 3.125 mg via ORAL
  Filled 2023-06-17 (×3): qty 1

## 2023-06-17 MED ORDER — HYDRALAZINE HCL 20 MG/ML IJ SOLN: 10.0000 mg | Freq: Four times a day (QID) | INTRAMUSCULAR | Status: DC | PRN

## 2023-06-17 MED ORDER — POTASSIUM CHLORIDE CRYS ER 20 MEQ PO TBCR
30.0000 meq | EXTENDED_RELEASE_TABLET | ORAL | Status: AC
  Administered 2023-06-17 (×3): 30 meq via ORAL
  Filled 2023-06-17 (×3): qty 1

## 2023-06-17 MED ORDER — ACETAMINOPHEN 325 MG PO TABS
650.0000 mg | ORAL_TABLET | Freq: Four times a day (QID) | ORAL | Status: DC | PRN
  Administered 2023-06-17 – 2023-06-18 (×2): 650 mg via ORAL
  Filled 2023-06-17 (×2): qty 2

## 2023-06-17 MED ORDER — ONDANSETRON HCL 4 MG/2ML IJ SOLN: 4.0000 mg | Freq: Four times a day (QID) | INTRAMUSCULAR | Status: DC | PRN

## 2023-06-17 MED ORDER — INSULIN ASPART PROT & ASPART (70-30 MIX) 100 UNIT/ML ~~LOC~~ SUSP
40.0000 [IU] | Freq: Two times a day (BID) | SUBCUTANEOUS | Status: DC
  Administered 2023-06-17 – 2023-06-18 (×3): 40 [IU] via SUBCUTANEOUS

## 2023-06-17 MED ORDER — INSULIN ASPART PROT & ASPART (70-30 MIX) 100 UNIT/ML ~~LOC~~ SUSP
40.0000 [IU] | Freq: Two times a day (BID) | SUBCUTANEOUS | Status: DC
  Filled 2023-06-17: qty 10

## 2023-06-17 MED ORDER — INSULIN ASPART PROT & ASPART (70-30 MIX) 100 UNIT/ML ~~LOC~~ SUSP
40.0000 [IU] | Freq: Two times a day (BID) | SUBCUTANEOUS | Status: DC
Start: 2023-06-17 — End: 2023-06-17

## 2023-06-17 MED ORDER — SODIUM CHLORIDE 0.9% FLUSH: 3.0000 mL | INTRAVENOUS | Status: DC | PRN

## 2023-06-17 MED ORDER — ENOXAPARIN SODIUM 40 MG/0.4ML IJ SOSY: 40.0000 mg | PREFILLED_SYRINGE | INTRAMUSCULAR | Status: DC

## 2023-06-17 MED ORDER — INSULIN ASPART 100 UNIT/ML IJ SOLN
0.0000 [IU] | Freq: Every day | INTRAMUSCULAR | Status: DC
  Administered 2023-06-17: 2 [IU] via SUBCUTANEOUS

## 2023-06-17 MED ORDER — ASPIRIN 81 MG PO CHEW
81.0000 mg | CHEWABLE_TABLET | Freq: Every day | ORAL | Status: DC
  Administered 2023-06-18: 81 mg via ORAL
  Filled 2023-06-17: qty 1

## 2023-06-17 MED ORDER — ONDANSETRON HCL 4 MG PO TABS: 4.0000 mg | ORAL_TABLET | Freq: Four times a day (QID) | ORAL | Status: DC | PRN

## 2023-06-17 NOTE — H&P (Signed)
History and Physical    Drew [name] ZOX:[id number] DOB: 08-Aug-1976 DOA: 06/16/2023  PCP: Patient, No Pcp Per   Patient coming from: Home   Chief Complaint:  Chief Complaint  Patient presents with   Eye Problem   Headache   Hypertension   Chest Pain    HPI:  Drew [name] is a 47 y.o. male with medical history significant of diabetic retinopathy associated with right sided complete blindness, and left-sided partial visual loss and insulin-dependent DM type I presented to emergency department evaluation for headache, chest pain and vision loss.  Patient reported that she has complete loss of vision of her right eye last month and decreased vision of the left eye over the same timeframe.  Reported she went to see ophthalmologist today and per ophthalmology at this point nothing can be done for progressive diabetic retinopathy. Patient is also endorsing headache and chest pain.  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  Reported chest pain radiates to his median to his back.  Chest pain started about 3 months ago however stable since then.  Denies any associated palpitation, diaphoresis and care.   ED Course:  At presentation to ED patient blood pressure found significantly elevated 208/102, tachycardic 112, respiratory 16 O2 sat on percent room air.  Blood pressure has been improved to 149/88 with 1 dose of labetalol in the ED.  Initial lab work showed sodium 134, potassium 4.5, low chloride 95, bicarb 21, blood glucose 400, BUN 14, creatinine 1.07, GFR above 60 and anion gap 18. Beta hydroxybutyrate within normal range. CBC unremarkable. UA showed increased specific gravity, glucose more than 500, hemoglobin positive, protein 100, leukocyte esterase and bacteria.  Troponin 48 which has been trended up to 54. UDS unremarkable.  Chest x-ray no acute cardiopulmonary process.  CT chest no acute intracranial process. . Moderate  stenosis in the proximal right cavernous ICA and proximal right V4.  CT angiogram head and neck no hemodynamically significant stenosis in the neck.  Due to chest pain elevated troponin cardiology has been consulted and evaluated patient in the ED.  Per cardiology NSTMI secondary from demand ischemia in the setting of hypertensive urgency.  Cardiology recommended to trend troponin,  check echocardiogram and aggressive control of blood pressure and hypertension.  ED physician Dr. Jacqulyn Bath also called inpatient ophthalmology.  Per ophthalmology continue conservative management and patient need to follow-up outpatient upon discharge.  In the ED patient received LR bolus 2 L.  Metropol 25 mg once, insulin 10 unit. Patient blood glucose has been improved to 181 after the unit of Lantus in the ED and blood pressure has been improved with metoprolol 25 mg.  Hospitalist has been contacted for further evaluation and management of uncontrolled diabetes, elevated blood pressure and chest pain.  Patient is Spanish-speaking.  Obtained history with help of video interpreter.   Review of Systems:  Review of Systems  Constitutional:  Negative for chills, fever, malaise/fatigue and weight loss.  HENT:  Negative for hearing loss and tinnitus.   Eyes:  Negative for blurred vision, double vision, photophobia, pain, discharge and redness.       Right-sided complete vision loss and left-sided partial vision loss.  Respiratory:  Negative for cough and shortness of breath.   Cardiovascular:  Negative for chest pain, palpitations and leg swelling.  Gastrointestinal:  Negative for abdominal pain, heartburn, nausea and vomiting.  Musculoskeletal:  Negative for back pain, myalgias and neck pain.  Neurological:  Negative  for dizziness, tingling, tremors, sensory change, speech change, focal weakness, loss of consciousness, weakness and headaches.  Psychiatric/Behavioral:  The patient is not nervous/anxious.     Past  Medical History:  Diagnosis Date   Diabetes mellitus without complication (HCC)     History reviewed. No pertinent surgical history.   reports that he has never smoked. He has never used smokeless tobacco. He reports that he does not drink alcohol and does not use drugs.  No Known Allergies  History reviewed. No pertinent family history.  Prior to Admission medications   Medication Sig Start Date End Date Taking? Authorizing Provider  ibuprofen (ADVIL,MOTRIN) 200 MG tablet Take 1,000 mg by mouth every 6 (six) hours as needed for moderate pain.   Yes [provider]  insulin NPH-regular Human (NOVOLIN 70/30) (70-30) 100 UNIT/ML injection Inject 40 Units into the skin daily with breakfast. Patient taking differently: Inject 30 Units into the skin daily. 02/15/14  Yes Marinda Elk, MD  amoxicillin (AMOXIL) 500 MG capsule Take 1 capsule (500 mg total) by mouth 3 (three) times daily. Patient not taking: Reported on 06/16/2023 08/01/18   Ward, Layla Maw, DO  trimethoprim-polymyxin b (POLYTRIM) ophthalmic solution Place 2 drops into both eyes every 6 (six) hours. For 5 days Patient not taking: Reported on 06/16/2023 08/01/18   Ward, Layla Maw, DO     Physical Exam: Vitals:   06/17/23 0130 06/17/23 0300 06/17/23 0330 06/17/23 0403  BP: (!) 149/88 139/81 134/79   Pulse: 91 85 86   Resp:      Temp:    98.2 F (36.8 C)  TempSrc:      SpO2: 99% 97% 97%   Weight:      Height:        Physical Exam Constitutional:      General: He is not in acute distress.    Appearance: He is not ill-appearing.  Eyes:     General: Visual field deficit present.     Pupils: Pupils are unequal.  Cardiovascular:     Rate and Rhythm: Normal rate and regular rhythm.     Heart sounds: Normal heart sounds. No murmur heard. Pulmonary:     Effort: Pulmonary effort is normal. No respiratory distress.     Breath sounds: Normal breath sounds. No wheezing.  Abdominal:     Palpations: Abdomen is  soft.  Musculoskeletal:        General: No swelling.     Cervical back: Neck supple.  Skin:    General: Skin is dry.     Capillary Refill: Capillary refill takes less than 2 seconds.  Neurological:     Mental Status: He is alert and oriented to person, place, and time.     Sensory: No sensory deficit.     Motor: No weakness.  Psychiatric:        Mood and Affect: Mood normal.        Speech: Speech normal.        Behavior: Behavior normal. Behavior is not agitated.        Cognition and Memory: Cognition is not impaired. Memory is not impaired.      Labs on Admission: I have personally reviewed following labs and imaging studies  CBC: Recent Labs  Lab 06/16/23 1727 06/16/23 1738 06/17/23 0415  WBC 6.1  --  7.5  HGB 15.2 14.6 13.9  HCT 43.3 43.0 37.9*  MCV 86.4  --  84.8  PLT 217  --  195   Basic  Metabolic Panel: Recent Labs  Lab 06/16/23 1727 06/16/23 1738 06/16/23 1851  NA 134* 134*  --   K 4.5 4.6  --   CL 95* 102  --   CO2 21*  --   --   GLUCOSE 499* 507*  --   BUN 14 18  --   CREATININE 1.07 1.00  --   CALCIUM 9.4  --   --   MG  --   --  2.2   GFR: Estimated Creatinine Clearance: 73.5 mL/min (by C-G formula based on SCr of 1 mg/dL). Liver Function Tests: Recent Labs  Lab 06/16/23 1851  AST 25  ALT 33  ALKPHOS 150*  BILITOT 0.8  PROT 7.7  ALBUMIN 3.4*   Recent Labs  Lab 06/16/23 1851  LIPASE 43   No results for input(s): "AMMONIA" in the last 168 hours. Coagulation Profile: No results for input(s): "INR", "PROTIME" in the last 168 hours. Cardiac Enzymes: Recent Labs  Lab 06/16/23 1727 06/16/23 2159  TROPONINIHS 48* 54*   BNP (last 3 results) No results for input(s): "BNP" in the last 8760 hours. HbA1C: Recent Labs    06/17/23 0402  HGBA1C 9.9*   CBG: Recent Labs  Lab 06/16/23 1733 06/16/23 2300 06/17/23 0415  GLUCAP 510* 340* 181*   Lipid Profile: No results for input(s): "CHOL", "HDL", "LDLCALC", "TRIG", "CHOLHDL",  "LDLDIRECT" in the last 72 hours. Thyroid Function Tests: No results for input(s): "TSH", "T4TOTAL", "FREET4", "T3FREE", "THYROIDAB" in the last 72 hours. Anemia Panel: No results for input(s): "VITAMINB12", "FOLATE", "FERRITIN", "TIBC", "IRON", "RETICCTPCT" in the last 72 hours. Urine analysis:    Component Value Date/Time   COLORURINE YELLOW 06/16/2023 1727   APPEARANCEUR CLEAR 06/16/2023 1727   LABSPEC 1.031 (H) 06/16/2023 1727   PHURINE 5.0 06/16/2023 1727   GLUCOSEU >=500 (A) 06/16/2023 1727   HGBUR MODERATE (A) 06/16/2023 1727   BILIRUBINUR NEGATIVE 06/16/2023 1727   KETONESUR NEGATIVE 06/16/2023 1727   PROTEINUR 100 (A) 06/16/2023 1727   UROBILINOGEN 1.0 02/14/2014 1615   NITRITE NEGATIVE 06/16/2023 1727   LEUKOCYTESUR SMALL (A) 06/16/2023 1727    Radiological Exams on Admission: I have personally reviewed images CT Head Wo Contrast  Result Date: 06/17/2023 CLINICAL DATA:  Stroke suspected, eye problem, headache, hypertension EXAM: CT ANGIOGRAPHY HEAD AND NECK TECHNIQUE: Multidetector CT imaging of the head and neck was performed using the standard protocol during bolus administration of intravenous contrast. Multiplanar CT image reconstructions and MIPs were obtained to evaluate the vascular anatomy. Carotid stenosis measurements (when applicable) are obtained utilizing NASCET criteria, using the distal internal carotid diameter as the denominator. RADIATION DOSE REDUCTION: This exam was performed according to the departmental dose-optimization program which includes automated exposure control, adjustment of the mA and/or kV according to patient size and/or use of iterative reconstruction technique. CONTRAST:  75mL OMNIPAQUE IOHEXOL 350 MG/ML SOLN COMPARISON:  09/01/2018 CT head, no prior CTA available FINDINGS: CT HEAD FINDINGS Brain: No evidence of acute infarct, hemorrhage, mass, mass effect, or midline shift. No hydrocephalus or extra-axial fluid collection. Vascular: No  hyperdense vessel. Skull: Negative for fracture or focal lesion. Sinuses/Orbits: Mucosal thickening in the right maxillary sinus. No acute finding in the orbits. Other: The mastoid air cells are well aerated. CTA NECK FINDINGS Aortic arch: Standard branching. Imaged portion shows no evidence of aneurysm or dissection. No significant stenosis of the major arch vessel origins. Right carotid system: No evidence of stenosis, dissection, or occlusion. Left carotid system: No evidence of stenosis, dissection, or occlusion.  Vertebral arteries: No evidence of stenosis, dissection, or occlusion. Skeleton: No acute osseous abnormality. Degenerative changes in the cervical spine. Other neck: No acute finding. Upper chest: No focal pulmonary opacity or pleural effusion. Review of the MIP images confirms the above findings CTA HEAD FINDINGS Anterior circulation: Both internal carotid arteries are patent to the termini, with moderate stenosis in the proximal right cavernous ICA (series 7, image 117), secondary to noncalcified plaque, with mild poststenotic dilatation. A1 segments patent. Normal anterior communicating artery. Anterior cerebral arteries are patent to their distal aspects without significant stenosis. No M1 stenosis or occlusion. MCA branches perfused to their distal aspects without significant stenosis. Posterior circulation: Vertebral arteries patent to the vertebrobasilar junction with moderate stenosis secondary to noncalcified plaque in the proximal right V4 (series 7, image 148). The proximal right V4 is otherwise irregular. Posterior inferior cerebellar artery patent on the left. Possible common origin of the right AICA and PICA Basilar patent to its distal aspect without significant stenosis. Superior cerebellar arteries patent proximally. Patent P1 segments. PCAs perfused to their distal aspects without significant stenosis. Venous sinuses: As permitted by contrast timing, patent. Anatomic variants: None  significant. Review of the MIP images confirms the above findings IMPRESSION: 1. No acute intracranial process. 2. No intracranial large vessel occlusion. 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 3. No hemodynamically significant stenosis in the neck. Electronically Signed   By: Wiliam Ke M.D.   On: 06/17/2023 00:15   CT ANGIO HEAD NECK W WO CM  Result Date: 06/17/2023 CLINICAL DATA:  Stroke suspected, eye problem, headache, hypertension EXAM: CT ANGIOGRAPHY HEAD AND NECK TECHNIQUE: Multidetector CT imaging of the head and neck was performed using the standard protocol during bolus administration of intravenous contrast. Multiplanar CT image reconstructions and MIPs were obtained to evaluate the vascular anatomy. Carotid stenosis measurements (when applicable) are obtained utilizing NASCET criteria, using the distal internal carotid diameter as the denominator. RADIATION DOSE REDUCTION: This exam was performed according to the departmental dose-optimization program which includes automated exposure control, adjustment of the mA and/or kV according to patient size and/or use of iterative reconstruction technique. CONTRAST:  75mL OMNIPAQUE IOHEXOL 350 MG/ML SOLN COMPARISON:  09/01/2018 CT head, no prior CTA available FINDINGS: CT HEAD FINDINGS Brain: No evidence of acute infarct, hemorrhage, mass, mass effect, or midline shift. No hydrocephalus or extra-axial fluid collection. Vascular: No hyperdense vessel. Skull: Negative for fracture or focal lesion. Sinuses/Orbits: Mucosal thickening in the right maxillary sinus. No acute finding in the orbits. Other: The mastoid air cells are well aerated. CTA NECK FINDINGS Aortic arch: Standard branching. Imaged portion shows no evidence of aneurysm or dissection. No significant stenosis of the major arch vessel origins. Right carotid system: No evidence of stenosis, dissection, or occlusion. Left carotid system: No evidence of  stenosis, dissection, or occlusion. Vertebral arteries: No evidence of stenosis, dissection, or occlusion. Skeleton: No acute osseous abnormality. Degenerative changes in the cervical spine. Other neck: No acute finding. Upper chest: No focal pulmonary opacity or pleural effusion. Review of the MIP images confirms the above findings CTA HEAD FINDINGS Anterior circulation: Both internal carotid arteries are patent to the termini, with moderate stenosis in the proximal right cavernous ICA (series 7, image 117), secondary to noncalcified plaque, with mild poststenotic dilatation. A1 segments patent. Normal anterior communicating artery. Anterior cerebral arteries are patent to their distal aspects without significant stenosis. No M1 stenosis or occlusion. MCA branches perfused to their distal aspects without significant  stenosis. Posterior circulation: Vertebral arteries patent to the vertebrobasilar junction with moderate stenosis secondary to noncalcified plaque in the proximal right V4 (series 7, image 148). The proximal right V4 is otherwise irregular. Posterior inferior cerebellar artery patent on the left. Possible common origin of the right AICA and PICA Basilar patent to its distal aspect without significant stenosis. Superior cerebellar arteries patent proximally. Patent P1 segments. PCAs perfused to their distal aspects without significant stenosis. Venous sinuses: As permitted by contrast timing, patent. Anatomic variants: None significant. Review of the MIP images confirms the above findings IMPRESSION: 1. No acute intracranial process. 2. No intracranial large vessel occlusion. 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 3. No hemodynamically significant stenosis in the neck. Electronically Signed   By: Wiliam Ke M.D.   On: 06/17/2023 00:15   DG Chest 2 View  Result Date: 06/16/2023 CLINICAL DATA:  CP EXAM: CHEST - 2 VIEW COMPARISON:  02/14/2014. FINDINGS: The heart  size and mediastinal contours are within normal limits. Both lungs are clear. The visualized skeletal structures are unremarkable. IMPRESSION: No acute cardiopulmonary disease. Electronically Signed   By: Layla Maw M.D.   On: 06/16/2023 19:17    EKG: My personal interpretation of EKG shows: EKG showed sinus tachycardia heart rate 105.  There is no history of abnormality.     Assessment/Plan: Principal Problem:   Hyperglycemic crisis due to diabetes mellitus (HCC) Active Problems:   NSTEMI (non-ST elevated myocardial infarction) (HCC)   Hypertensive emergency   Diabetic retinopathy (HCC)   Blindness of right eye   Hypertensive urgency   Headache   Insulin dependent type 1 diabetes mellitus (HCC)   AKI (acute kidney injury) (HCC)    Assessment and Plan: Hyperglycemic crisis due to DM type 1 History of diabetes diabetes mellitus type 1 -Patient presenting to emergency department with complaining of headache, chest pain and with associated shortness of breath for 1 day. -In the ED found to have elevated blood glucose and elevated blood pressure as well. -BMP showed elevated blood glucose 449, bicarb 21 anion gap 18.  Normal beta-hydroxybutyrate level.  Increase from osmolarity 320. -Patient reported he is compliant with insulin regimen at home. -In the ED patient received NovoLog 10 unit once.  Blood glucose gradually has been improved 510>340>181. -Checking A1c level. -Resuming NovoLog Mix 70/30 40 units twice daily with meal. - Continue carb modified diet. - Continue POC blood glucose check with meal and bedtime and continue coverage with SSI and at bedtime insulin as needed. -Checking A1c - Counseled patient at the bedside to be compliant with insulin regimen at home. -Consulted transition care team for insulin and medication assistance.   Proliferative diabetic retinopathy (right-sided complete vision loss and left-sided partial vision loss) -Patient reported history of  vision loss of the right eye for 1 month and partial vision loss of the left eye for last few days. -Patient has been evaluated by ophthalmologist Dr. Jenkins Rouge at [facility].  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  -On presentation to ED head CT is obtained which is unremarkable - EDP physician Dr. Jacqulyn Bath consulted inpatient ophthalmology recommended conservative management and follow-up outpatient nephrology for long-term care. -Unfortunately patient has complete loss of vision of the right eye and partial vision loss of the left eye due to progressive diabetic retinopathy. -Advised patient not to drive by himself and  avoid using any sharp and missionary objects. -Consulted PT and OT assess for balance with requirement of any DME as patient has right-sided complete blindness and left-sided partial blindness   Hypertensive emergency No previous history of hypertension in the past -Patient presenting with chest tightness and shortness of breath. -Chest x-ray unremarkable.  No signs of flash pulmonary edema. - At presentation to ED patient blood pressure found 208/102, and heart rate 112. - Blood pressure has been improved to 134/70 after dose of metoprolol 25 mg in the ED. - Starting low-dose Coreg 3.125 mg twice daily and amlodipine 5 mg daily. - Obtaining echocardiogram. -Checking A1c and lipid panel - Continue hydralazine as needed.   NSTMI-type II Elevated troponin -Patient presenting with chest tightness and shortness of breath. -First troponin 48 trended up to 54. - EKG showed sinus tachycardia 105.  No ST-T wave abnormality - Currently patient is chest pain-free -In the ED patient received aspirin 325 mg once and Lopressor 25 mg once. -Per cardiology patient has NSTMI type 2 due to demand ischemia in the setting of hypertensive  urgency.  Recommended good blood pressure and diabetes control, outpatient stress test, trend troponin till peak and echocardiogram. -Continue aspirin 81 mg daily - Starting Lipitor 20 mg daily - Starting low-dose Coreg 3.125 mg twice daily. -Obtain echocardiogram - Continue to trend troponin until it peaks.  Continue monitor for chest pain.   Acute kidney injury-secondary to elevated blood pressure - On presentation creatinine 1.07.  Per chart review patient's baseline creatinine is around 0.5-2.73.  Unclear if patient has acute kidney injury versus renal function has been progressed.  GFR is above 60 now. - Possible acute kidney injury in the setting of elevated blood pressure. - Continue to monitor renal function. - Continue monitor urine output  Headache Patient presenting with throbbing throbbing quality headache for 1 day.  Patient denies any syncope, presyncope, loss of consciousness and seizure.  Is alert oriented x 4. - Head CT no acute intracranial finding. -CT angiogram head and neck showed moderate stenosis in the proximal right cavernous ICA and proximal right V4. -Headache secondary to hypertensive emergency. - Patient reported headache has been resolved once blood pressure has been improved in the ED. - Continue Tylenol as needed. -I will defer to decision to refer patient to outpatient vascular surgery for the CT angiogram neck finding..   DVT prophylaxis: Current. Code Status:  Full Code Diet: Heart healthy and carb modified diet Family Communication: No family member at the bedside now Disposition Plan:   Consults: PT, OT and transition care team  for medication assistance Admission status:   Inpatient, Telemetry bed  Severity of Illness: The appropriate patient status for this patient is INPATIENT. Inpatient status is judged to be reasonable and necessary in order to provide the required intensity of service to ensure the patient's safety. The patient's presenting  symptoms, physical exam findings, and initial radiographic and laboratory data in the context of their chronic comorbidities is felt to place them at high risk for further clinical deterioration. Furthermore, it is not anticipated that the patient will be medically stable for discharge from the hospital within 2 midnights of admission.   * I certify that at the point of admission it is my clinical judgment that the patient will require inpatient hospital care spanning beyond 2 midnights from the point of admission due to high intensity of service, high risk for further deterioration and high frequency of surveillance required.Marland Kitchen    Tereasa Coop, MD  [facility]  How to contact the [facility] Attending or Consulting provider 7A - 7P or covering provider during after hours 7P -7A, for this patient.  Check the care team in [facility] and look for a) attending/consulting [facility] provider listed and b) the [facility] team listed Log into [email or web address] and use [facility] universal password to access. If you do not have the password, please contact the hospital operator. Locate the [facility] provider you are looking for under [facility] and page to a number that you can be directly reached. If you still have difficulty reaching the provider, please page the [facility] (Director on Call) for the Hospitalists listed on amion for assistance.  06/17/2023, 4:54 AM

## 2023-06-17 NOTE — Progress Notes (Signed)
PROGRESS NOTE    Drew [name]  ZOX:[id number] DOB: [birth year] DOA: 06/16/2023 PCP: Patient, No Pcp Per    Brief Narrative:   Drew [name] is a 47 y.o. male with past medical history significant for type 1 diabetes mellitus, diabetic retinopathy associated with right sided complete blindness and left-sided partial visual loss who presented to [facility] ED on 9/16 for evaluation of headache, chest pain and vision loss.  He reports complete loss of vision right eye last month and decreased vision in the left eye over the same timeframe.  Saw ophthalmologist outpatient; unfortunately nothing can be done for his progressive diabetic retinopathy at this time.  Patient endorsing headache and chest pain.  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  Onset 3 months ago.  Denies any associated palpitations, no diaphoresis.  Patient reports that he "buys his medications" for diabetes at [facility].  Does not have a PCP that manages his comorbidities.  In the ED, temperature 98.6 F, HR 112, RR 16, BP 208/111, SpO2 100% on room air.  WBC 6.1, hemoglobin 15.2, platelet count 217.   Sodium 134, potassium 4.5, chloride 95, CO2 21, glucose 510, BUN 14, creatinine 1.07, anion gap 18.  Lipase 43, AST 20, ALT 26, total bilirubin 1.0. Beta hydroxybutyrate acid 0.09.  Urinalysis with small leukocytes, negative nitrite, negative ketones, rare bacteria, 6/10 WBCs.  UDS negative.    High sensitive troponin 48>54>57>57.  CT head/CT angiogram head/neck with no acute intracranial process, no intracranial large vessel occlusion, moderate stenosis proximal right cavernous ICA and proximal right V4, no hemodynamic significant stenosis in the neck.  Chest x-ray with no acute cardiopulmonary disease process.  Cardiology was consulted.  TRH consulted for admission for further evaluation management of hypertensive emergency, type 1 diabetes mellitus with hyperglycemia with progressive diabetic  retinopathy, chest pain.  Assessment & Plan:   Hypertensive emergency Patient presenting with a blood pressure of 2 8/111, not on antihypertensive treatment outpatient.  Chest x-ray unrevealing.   -- TTE: Pending -- Amlodipine 5 mg p.o. daily -- Carvedilol 3.125 mg p.o. BID -- Hydralazine 10 mg IV every 6 hours as needed SBP greater than 160 or DBP greater than 110 -- Started on aspirin and statin  Type 1 diabetes mellitus with hyperglycemia Diabetic retinopathy, progressive Hemoglobin A1c 9.9, poorly controlled.  Not followed by PCP for management of his diabetes.  Patient reports he purchases his insulin at [facility].  Glucose 510 on admission.  Home regimen includes NPH 40 units subcutaneously daily.  Has been eval by ophthalmology, Dr. 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  EDP consulted ophthalmology with recommendation of conservative management and outpatient follow-up for long-term care. -- Diabetic educator consulted -- NPH 40u [place] BID -- SSI for coverage -- CBGs qAC/HS  Elevated troponin secondary to type II demand ischemia Patient presenting with chest pain, troponin noted to be elevated at 48, serial monitoring noted to be flat at 57.  EKG with normal sinus rhythm, rate 105, QTc 462, no concerning ST elevation/depressions or T wave inversions.  Etiology likely secondary to hypertensive emergency as above.  Seen by cardiology. --Cardiology following, appreciate assistance -- TTE: Pending -- BP control as above -- Continue monitor on telemetry  Dyslipidemia Lipid panel with total cholesterol 156, HDL 43, LDL 77, triglycerides 181. -- Atorvastatin 20 mg p.o. daily  Acute renal failure:  Resolved Creatinine on presentation elevated 1.07.  Baseline 0.5.  Etiology likely secondary to poorly controlled diabetes, dehydration  versus hypertensive emergency as above. -- Cr 1.07>0.66 -- CMP in the a.m.   DVT prophylaxis: enoxaparin (LOVENOX) injection 40 mg Start: 06/17/23 1400 SCDs Start: 06/17/23 0404    Code Status: Full Code Family Communication: No family present at bedside this morning  Disposition Plan:  Level of care: Telemetry Medical Status is: Inpatient Remains inpatient appropriate because: Needs further monitoring and titration of antihypertensives and insulin regimen, needs social work involvement to assist with medications and PCP appointment    Consultants:  Cardiology  Procedures:  TTE: Pending  Antimicrobials:  None   Subjective: Patient seen examined bedside, resting comfortable.  Remains in ED holding area.  Assisted in interpretation with video interpreter Drew [name] [phone].  Patient reports continued visual disturbance, headache improved.  Asks about his home medication regimen, reports that he buys his insulin at [facility] and no physician manages his insulin regimen.  Only taking NPH once daily.  Has noted progressive vision change recently saw outpatient ophthalmologist and unfortunately has had progressive diabetic retinopathy.  Patient with no other specific complaints or concerns at this time.  Denies chest pain currently, no shortness of breath, no abdominal pain, no dizziness, no fever/chills/night sweats, no nausea/vomiting/diarrhea, no focal weakness, no cough/congestion, no paresthesias.  No acute events overnight per nurse staff.  Objective: Vitals:   06/17/23 0812 06/17/23 1000 06/17/23 1004 06/17/23 1200  BP:  130/76 125/76   Pulse:  93 92   Resp:   17   Temp: 98.3 F (36.8 C)   97.6 F (36.4 C)  TempSrc: Oral   Oral  SpO2:  100% 100%   Weight:      Height:       No intake or output data in the 24 hours ending 06/17/23 1202 Filed Weights   06/16/23 1719  Weight: 60 kg    Examination:  Physical Exam: GEN: NAD, alert and oriented x 3, wd/wn HEENT: NCAT,  PERRL, EOMI, sclera clear, MMM; decreased/absent vision OD, decreased vision noted OS PULM: CTAB w/o wheezes/crackles, normal respiratory effort CV: RRR w/o M/G/R GI: abd soft, NTND, NABS, no R/G/M MSK: no peripheral edema, muscle strength globally intact 5/5 bilateral upper/lower extremities NEURO: CN II-XII intact, no focal deficits, sensation to light touch intact PSYCH: normal mood/affect Integumentary: No concerning rashes/lesions/wounds noted on exposed skin surfaces    Data Reviewed: I have personally reviewed following labs and imaging studies  CBC: Recent Labs  Lab 06/16/23 1727 06/16/23 1738 06/17/23 0415  WBC 6.1  --  7.5  HGB 15.2 14.6 13.9  HCT 43.3 43.0 37.9*  MCV 86.4  --  84.8  PLT 217  --  195   Basic Metabolic Panel: Recent Labs  Lab 06/16/23 1727 06/16/23 1738 06/16/23 1851 06/17/23 0415  NA 134* 134*  --  137  K 4.5 4.6  --  3.0*  CL 95* 102  --  107  CO2 21*  --   --  24  GLUCOSE 499* 507*  --  198*  BUN 14 18  --  12  CREATININE 1.07 1.00  --  0.66  CALCIUM 9.4  --   --  8.4*  MG  --   --  2.2  --    GFR: Estimated Creatinine Clearance: 91.9 mL/min (by C-G formula based on SCr of 0.66 mg/dL). Liver Function Tests: Recent Labs  Lab 06/16/23 1851 06/17/23 0415  AST 25 20  ALT 33 26  ALKPHOS 150* 105  BILITOT 0.8 1.0  PROT 7.7 6.5  ALBUMIN 3.4* 2.9*   Recent Labs  Lab 06/16/23 1851  LIPASE 43   No results for input(s): "AMMONIA" in the last 168 hours. Coagulation Profile: No results for input(s): "INR", "PROTIME" in the last 168 hours. Cardiac Enzymes: No results for input(s): "CKTOTAL", "CKMB", "CKMBINDEX", "TROPONINI" in the last 168 hours. BNP (last 3 results) No results for input(s): "PROBNP" in the last 8760 hours. HbA1C: Recent Labs    06/17/23 0402  HGBA1C 9.9*   CBG: Recent Labs  Lab 06/16/23 1733 06/16/23 2300 06/17/23 0415 06/17/23 0757  GLUCAP 510* 340* 181* 264*   Lipid Profile: Recent Labs     06/17/23 0415  CHOL 156  HDL 43  LDLCALC 77  TRIG 181*  CHOLHDL 3.6   Thyroid Function Tests: No results for input(s): "TSH", "T4TOTAL", "FREET4", "T3FREE", "THYROIDAB" in the last 72 hours. Anemia Panel: No results for input(s): "VITAMINB12", "FOLATE", "FERRITIN", "TIBC", "IRON", "RETICCTPCT" in the last 72 hours. Sepsis Labs: No results for input(s): "PROCALCITON", "LATICACIDVEN" in the last 168 hours.  No results found for this or any previous visit (from the past 240 hour(s)).       Radiology Studies: CT Head Wo Contrast  Result Date: 06/17/2023 CLINICAL DATA:  Stroke suspected, eye problem, headache, hypertension EXAM: CT ANGIOGRAPHY HEAD AND NECK TECHNIQUE: Multidetector CT imaging of the head and neck was performed using the standard protocol during bolus administration of intravenous contrast. Multiplanar CT image reconstructions and MIPs were obtained to evaluate the vascular anatomy. Carotid stenosis measurements (when applicable) are obtained utilizing NASCET criteria, using the distal internal carotid diameter as the denominator. RADIATION DOSE REDUCTION: This exam was performed according to the departmental dose-optimization program which includes automated exposure control, adjustment of the mA and/or kV according to patient size and/or use of iterative reconstruction technique. CONTRAST:  75mL OMNIPAQUE IOHEXOL 350 MG/ML SOLN COMPARISON:  09/01/2018 CT head, no prior CTA available FINDINGS: CT HEAD FINDINGS Brain: No evidence of acute infarct, hemorrhage, mass, mass effect, or midline shift. No hydrocephalus or extra-axial fluid collection. Vascular: No hyperdense vessel. Skull: Negative for fracture or focal lesion. Sinuses/Orbits: Mucosal thickening in the right maxillary sinus. No acute finding in the orbits. Other: The mastoid air cells are well aerated. CTA NECK FINDINGS Aortic arch: Standard branching. Imaged portion shows no evidence of aneurysm or dissection. No  significant stenosis of the major arch vessel origins. Right carotid system: No evidence of stenosis, dissection, or occlusion. Left carotid system: No evidence of stenosis, dissection, or occlusion. Vertebral arteries: No evidence of stenosis, dissection, or occlusion. Skeleton: No acute osseous abnormality. Degenerative changes in the cervical spine. Other neck: No acute finding. Upper chest: No focal pulmonary opacity or pleural effusion. Review of the MIP images confirms the above findings CTA HEAD FINDINGS Anterior circulation: Both internal carotid arteries are patent to the termini, with moderate stenosis in the proximal right cavernous ICA (series 7, image 117), secondary to noncalcified plaque, with mild poststenotic dilatation. A1 segments patent. Normal anterior communicating artery. Anterior cerebral arteries are patent to their distal aspects without significant stenosis. No M1 stenosis or occlusion. MCA branches perfused to their distal aspects without significant stenosis. Posterior circulation: Vertebral arteries patent to the vertebrobasilar junction with moderate stenosis secondary to noncalcified plaque in the proximal right V4 (series 7, image 148). The proximal right V4 is otherwise irregular. Posterior inferior cerebellar artery patent on the left. Possible common origin of  the right AICA and PICA Basilar patent to its distal aspect without significant stenosis. Superior cerebellar arteries patent proximally. Patent P1 segments. PCAs perfused to their distal aspects without significant stenosis. Venous sinuses: As permitted by contrast timing, patent. Anatomic variants: None significant. Review of the MIP images confirms the above findings IMPRESSION: 1. No acute intracranial process. 2. No intracranial large vessel occlusion. 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 3. No hemodynamically significant stenosis in the neck. Electronically Signed   By: Wiliam Ke M.D.   On: 06/17/2023 00:15   CT ANGIO HEAD NECK W WO CM  Result Date: 06/17/2023 CLINICAL DATA:  Stroke suspected, eye problem, headache, hypertension EXAM: CT ANGIOGRAPHY HEAD AND NECK TECHNIQUE: Multidetector CT imaging of the head and neck was performed using the standard protocol during bolus administration of intravenous contrast. Multiplanar CT image reconstructions and MIPs were obtained to evaluate the vascular anatomy. Carotid stenosis measurements (when applicable) are obtained utilizing NASCET criteria, using the distal internal carotid diameter as the denominator. RADIATION DOSE REDUCTION: This exam was performed according to the departmental dose-optimization program which includes automated exposure control, adjustment of the mA and/or kV according to patient size and/or use of iterative reconstruction technique. CONTRAST:  75mL OMNIPAQUE IOHEXOL 350 MG/ML SOLN COMPARISON:  09/01/2018 CT head, no prior CTA available FINDINGS: CT HEAD FINDINGS Brain: No evidence of acute infarct, hemorrhage, mass, mass effect, or midline shift. No hydrocephalus or extra-axial fluid collection. Vascular: No hyperdense vessel. Skull: Negative for fracture or focal lesion. Sinuses/Orbits: Mucosal thickening in the right maxillary sinus. No acute finding in the orbits. Other: The mastoid air cells are well aerated. CTA NECK FINDINGS Aortic arch: Standard branching. Imaged portion shows no evidence of aneurysm or dissection. No significant stenosis of the major arch vessel origins. Right carotid system: No evidence of stenosis, dissection, or occlusion. Left carotid system: No evidence of stenosis, dissection, or occlusion. Vertebral arteries: No evidence of stenosis, dissection, or occlusion. Skeleton: No acute osseous abnormality. Degenerative changes in the cervical spine. Other neck: No acute finding. Upper chest: No focal pulmonary opacity or pleural effusion. Review of the MIP images confirms the above  findings CTA HEAD FINDINGS Anterior circulation: Both internal carotid arteries are patent to the termini, with moderate stenosis in the proximal right cavernous ICA (series 7, image 117), secondary to noncalcified plaque, with mild poststenotic dilatation. A1 segments patent. Normal anterior communicating artery. Anterior cerebral arteries are patent to their distal aspects without significant stenosis. No M1 stenosis or occlusion. MCA branches perfused to their distal aspects without significant stenosis. Posterior circulation: Vertebral arteries patent to the vertebrobasilar junction with moderate stenosis secondary to noncalcified plaque in the proximal right V4 (series 7, image 148). The proximal right V4 is otherwise irregular. Posterior inferior cerebellar artery patent on the left. Possible common origin of the right AICA and PICA Basilar patent to its distal aspect without significant stenosis. Superior cerebellar arteries patent proximally. Patent P1 segments. PCAs perfused to their distal aspects without significant stenosis. Venous sinuses: As permitted by contrast timing, patent. Anatomic variants: None significant. Review of the MIP images confirms the above findings IMPRESSION: 1. No acute intracranial process. 2. No intracranial large vessel occlusion. 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 3. No hemodynamically significant stenosis in the neck. Electronically Signed   By: Wiliam Ke M.D.   On: 06/17/2023 00:15   DG Chest 2 View  Result Date: 06/16/2023 CLINICAL DATA:  CP EXAM: CHEST - 2 VIEW COMPARISON:  02/14/2014. FINDINGS: The heart size and mediastinal contours are within normal limits. Both lungs are clear. The visualized skeletal structures are unremarkable. IMPRESSION: No acute cardiopulmonary disease. Electronically Signed   By: Layla Maw M.D.   On: 06/16/2023 19:17        Scheduled Meds:  amLODipine  5 mg Oral Daily   [START ON  06/18/2023] aspirin  81 mg Oral Daily   atorvastatin  20 mg Oral Daily   carvedilol  3.125 mg Oral BID WC   enoxaparin (LOVENOX) injection  40 mg Subcutaneous Q24H   insulin aspart  0-5 Units Subcutaneous QHS   insulin aspart  0-6 Units Subcutaneous TID WC   insulin aspart protamine- aspart  40 Units Subcutaneous BID WC   sodium chloride flush  3 mL Intravenous Q12H   Continuous Infusions:  sodium chloride       LOS: 0 days    Time spent: 53 minutes spent on chart review, discussion with nursing staff, consultants, updating family and interview/physical exam; more than 50% of that time was spent in counseling and/or coordination of care.    Alvira Philips [place], DO [facility] Available via Epic secure chat 7am-7pm After these hours, please refer to coverage provider listed on [email or web address] 06/17/2023, 12:02 PM

## 2023-06-17 NOTE — Plan of Care (Signed)

## 2023-06-17 NOTE — ED Notes (Signed)
ED TO INPATIENT HANDOFF REPORT  ED Nurse Name and Phone #: Gustavo Lah, rn [phone]   S Name/Age/Gender Lucinda Dell 47 y.o. male Room/Bed: 004C/004C  Code Status   Code Status: Full Code  Home/SNF/Other Home Patient oriented to: self, place, time, and situation Is this baseline? Yes   Triage Complete: Triage complete  Chief Complaint Hyperglycemic crisis due to diabetes mellitus (HCC) [E11.65] Hypertensive emergency [I16.1]  Triage Note Pt came in via POV d/t vision problems that started 3 months ago, he reports seeing flashes of light & it is getting very hard to see, endorses photosensitivity. Also reports he is a diabetes & does not check his CBG routinely, last check his CBG 15 days ago & reports it was almost 200. Also endorses some CP while in triage, no radiation. Also has a 10/10 HA, A/Ox4.      Allergies No Known Allergies  Level of Care/Admitting Diagnosis ED Disposition     ED Disposition  Admit   Condition  --   Comment  Hospital Area: [facility] [[id number]]  Level of Care: Telemetry Medical [104]  May admit patient to [facility] or [facility] if equivalent level of care is available:: No  Covid Evaluation: Asymptomatic - no recent exposure (last 10 days) testing not required  Diagnosis: Hypertensive emergency [phone]  Admitting Physician: [place], ERIC J [[id number]]  Attending Physician: [place], ERIC J [[id number]]  Certification:: I certify this patient will need inpatient services for at least 2 midnights          B Medical/Surgery History Past Medical History:  Diagnosis Date   Diabetes mellitus without complication (HCC)    History reviewed. No pertinent surgical history.   A IV Location/Drains/Wounds Patient Lines/Drains/Airways Status     Active Line/Drains/Airways     Name Placement date Placement time Site Days   Peripheral IV 06/16/23 20 G Anterior;Right Forearm 06/16/23  1837  Forearm  1             Intake/Output Last 24 hours No intake or output data in the 24 hours ending 06/17/23 1356  Labs/Imaging Results for orders placed or performed during the hospital encounter of 06/16/23 (from the past 48 hour(s))  Basic metabolic panel     Status: Abnormal   Collection Time: 06/16/23  5:27 PM  Result Value Ref Range   Sodium 134 (L) 135 - 145 mmol/L   Potassium 4.5 3.5 - 5.1 mmol/L   Chloride 95 (L) 98 - 111 mmol/L   CO2 21 (L) 22 - 32 mmol/L   Glucose, Bld 499 (H) 70 - 99 mg/dL    Comment: Glucose reference range applies only to samples taken after fasting for at least 8 hours.   BUN 14 6 - 20 mg/dL   Creatinine, Ser 2.13 0.61 - 1.24 mg/dL   Calcium 9.4 8.9 - 08.6 mg/dL   GFR, Estimated >57 >84 mL/min    Comment: (NOTE) Calculated using the CKD-EPI Creatinine Equation (2021)    Anion gap 18 (H) 5 - 15    Comment: Performed at [facility], [address], [place], [place] [address]  CBC     Status: None   Collection Time: 06/16/23  5:27 PM  Result Value Ref Range   WBC 6.1 4.0 - 10.5 K/uL   RBC 5.01 4.22 - 5.81 MIL/uL   Hemoglobin 15.2 13.0 - 17.0 g/dL   HCT 52.8 41.3 - 24.4 %   MCV 86.4 80.0 - 100.0 fL  MCH 30.3 26.0 - 34.0 pg   MCHC 35.1 30.0 - 36.0 g/dL   RDW 09.8 11.9 - 14.7 %   Platelets 217 150 - 400 K/uL   nRBC 0.0 0.0 - 0.2 %    Comment: Performed at [facility], [address] [place], [place] [address]  Troponin I (High Sensitivity)     Status: Abnormal   Collection Time: 06/16/23  5:27 PM  Result Value Ref Range   Troponin I (High Sensitivity) 48 (H) <18 ng/L    Comment: (NOTE) Elevated high sensitivity troponin I (hsTnI) values and significant  changes across serial measurements may suggest ACS but many other  chronic and acute conditions are known to elevate hsTnI results.  Refer to the "Links" section for chest pain algorithms and additional  guidance. Performed at [facility], [address], [place], [place] [address]    Urinalysis, Routine w reflex microscopic -Urine, Clean Catch     Status: Abnormal   Collection Time: 06/16/23  5:27 PM  Result Value Ref Range   Color, Urine YELLOW YELLOW   APPearance CLEAR CLEAR   Specific Gravity, Urine 1.031 (H) 1.005 - 1.030   pH 5.0 5.0 - 8.0   Glucose, UA >=500 (A) NEGATIVE mg/dL   Hgb urine dipstick MODERATE (A) NEGATIVE   Bilirubin Urine NEGATIVE NEGATIVE   Ketones, ur NEGATIVE NEGATIVE mg/dL   Protein, ur 657 (A) NEGATIVE mg/dL   Nitrite NEGATIVE NEGATIVE   Leukocytes,Ua SMALL (A) NEGATIVE   RBC / HPF 6-10 0 - 5 RBC/hpf   WBC, UA 6-10 0 - 5 WBC/hpf   Bacteria, UA RARE (A) NONE SEEN   Squamous Epithelial / HPF 0-5 0 - 5 /HPF   Mucus PRESENT     Comment: Performed at [facility], [address], [place], [place] [address]  CBG monitoring, ED     Status: Abnormal   Collection Time: 06/16/23  5:33 PM  Result Value Ref Range   Glucose-Capillary 510 (HH) 70 - 99 mg/dL    Comment: Glucose reference range applies only to samples taken after fasting for at least 8 hours.   Comment 1 Notify RN    Comment 2 Document in Chart   I-stat chem 8, ed     Status: Abnormal   Collection Time: 06/16/23  5:38 PM  Result Value Ref Range   Sodium 134 (L) 135 - 145 mmol/L   Potassium 4.6 3.5 - 5.1 mmol/L   Chloride 102 98 - 111 mmol/L   BUN 18 6 - 20 mg/dL   Creatinine, Ser 2.95 0.61 - 1.24 mg/dL   Glucose, Bld 284 (HH) 70 - 99 mg/dL    Comment: Glucose reference range applies only to samples taken after fasting for at least 8 hours.   Calcium, Ion 1.09 (L) 1.15 - 1.40 mmol/L   TCO2 22 22 - 32 mmol/L   Hemoglobin 14.6 13.0 - 17.0 g/dL   HCT 13.2 44.0 - 10.2 %   Comment NOTIFIED PHYSICIAN   Beta-hydroxybutyric acid     Status: None   Collection Time: 06/16/23  6:51 PM  Result Value Ref Range   Beta-Hydroxybutyric Acid 0.09 0.05 - 0.27 mmol/L    Comment: Performed at [facility], [address], [place], [place] [address]  Magnesium     Status: None    Collection Time: 06/16/23  6:51 PM  Result Value Ref Range   Magnesium 2.2 1.7 - 2.4 mg/dL  Comment: Performed at [facility], [address] [place], [place] [address]  Hepatic function panel     Status: Abnormal   Collection Time: 06/16/23  6:51 PM  Result Value Ref Range   Total Protein 7.7 6.5 - 8.1 g/dL   Albumin 3.4 (L) 3.5 - 5.0 g/dL   AST 25 15 - 41 U/L   ALT 33 0 - 44 U/L   Alkaline Phosphatase 150 (H) 38 - 126 U/L   Total Bilirubin 0.8 0.3 - 1.2 mg/dL   Bilirubin, Direct 0.2 0.0 - 0.2 mg/dL   Indirect Bilirubin 0.6 0.3 - 0.9 mg/dL    Comment: Performed at [facility], [address] [place], [place] [address]  Lipase, blood     Status: None   Collection Time: 06/16/23  6:51 PM  Result Value Ref Range   Lipase 43 11 - 51 U/L    Comment: Performed at [facility], [address] [place], [place] [address]  Osmolality     Status: Abnormal   Collection Time: 06/16/23  9:59 PM  Result Value Ref Range   Osmolality 320 (H) 275 - 295 mOsm/kg    Comment: Performed at [facility], [address], [place], [place] [address]  Troponin I (High Sensitivity)     Status: Abnormal   Collection Time: 06/16/23  9:59 PM  Result Value Ref Range   Troponin I (High Sensitivity) 54 (H) <18 ng/L    Comment: (NOTE) Elevated high sensitivity troponin I (hsTnI) values and significant  changes across serial measurements may suggest ACS but many other  chronic and acute conditions are known to elevate hsTnI results.  Refer to the "Links" section for chest pain algorithms and additional  guidance. Performed at [facility], [address] [place], [place] [address]   Rapid urine drug screen (hospital performed)     Status: None   Collection Time: 06/16/23  9:59 PM  Result Value Ref Range   Opiates NONE DETECTED NONE DETECTED   Cocaine NONE DETECTED NONE DETECTED   Benzodiazepines NONE DETECTED NONE DETECTED   Amphetamines NONE DETECTED NONE DETECTED    Tetrahydrocannabinol NONE DETECTED NONE DETECTED   Barbiturates NONE DETECTED NONE DETECTED    Comment: (NOTE) DRUG SCREEN FOR MEDICAL PURPOSES ONLY.  IF CONFIRMATION IS NEEDED FOR ANY PURPOSE, NOTIFY LAB WITHIN 5 DAYS.  LOWEST DETECTABLE LIMITS FOR URINE DRUG SCREEN Drug Class                     Cutoff (ng/mL) Amphetamine and metabolites    1000 Barbiturate and metabolites    200 Benzodiazepine                 200 Opiates and metabolites        300 Cocaine and metabolites        300 THC                            50 Performed at [facility], [address], [place], [place] [address]   CBG monitoring, ED     Status: Abnormal   Collection Time: 06/16/23 11:00 PM  Result Value Ref Range   Glucose-Capillary 340 (H) 70 - 99 mg/dL    Comment: Glucose reference range applies only to samples taken after fasting for at least 8 hours.  Hemoglobin A1c     Status: Abnormal  Collection Time: 06/17/23  4:02 AM  Result Value Ref Range   Hgb A1c MFr Bld 9.9 (H) 4.8 - 5.6 %    Comment: (NOTE) Pre diabetes:          5.7%-6.4%  Diabetes:              >6.4%  Glycemic control for   <7.0% adults with diabetes    Mean Plasma Glucose 237.43 mg/dL    Comment: Performed at [facility], [address], [place], [place] [address]  HIV Antibody (routine testing w rflx)     Status: None   Collection Time: 06/17/23  4:15 AM  Result Value Ref Range   HIV Screen 4th Generation wRfx Non Reactive Non Reactive    Comment: Performed at [facility], [address], [place], [place] [address]  CBC     Status: Abnormal   Collection Time: 06/17/23  4:15 AM  Result Value Ref Range   WBC 7.5 4.0 - 10.5 K/uL   RBC 4.47 4.22 - 5.81 MIL/uL   Hemoglobin 13.9 13.0 - 17.0 g/dL   HCT 52.8 (L) 41.3 - 24.4 %   MCV 84.8 80.0 - 100.0 fL   MCH 31.1 26.0 - 34.0 pg   MCHC 36.7 (H) 30.0 - 36.0 g/dL   RDW 01.0 27.2 - 53.6 %   Platelets 195 150 - 400 K/uL   nRBC 0.0 0.0 - 0.2 %    Comment:  Performed at [facility], [address] [place], [place] [address]  Troponin I (High Sensitivity)     Status: Abnormal   Collection Time: 06/17/23  4:15 AM  Result Value Ref Range   Troponin I (High Sensitivity) 57 (H) <18 ng/L    Comment: (NOTE) Elevated high sensitivity troponin I (hsTnI) values and significant  changes across serial measurements may suggest ACS but many other  chronic and acute conditions are known to elevate hsTnI results.  Refer to the "Links" section for chest pain algorithms and additional  guidance. Performed at [facility], [address] [place], [place] [address]   Comprehensive metabolic panel     Status: Abnormal   Collection Time: 06/17/23  4:15 AM  Result Value Ref Range   Sodium 137 135 - 145 mmol/L   Potassium 3.0 (L) 3.5 - 5.1 mmol/L   Chloride 107 98 - 111 mmol/L   CO2 24 22 - 32 mmol/L   Glucose, Bld 198 (H) 70 - 99 mg/dL    Comment: Glucose reference range applies only to samples taken after fasting for at least 8 hours.   BUN 12 6 - 20 mg/dL   Creatinine, Ser 9.56 0.61 - 1.24 mg/dL   Calcium 8.4 (L) 8.9 - 10.3 mg/dL   Total Protein 6.5 6.5 - 8.1 g/dL   Albumin 2.9 (L) 3.5 - 5.0 g/dL   AST 20 15 - 41 U/L   ALT 26 0 - 44 U/L   Alkaline Phosphatase 105 38 - 126 U/L   Total Bilirubin 1.0 0.3 - 1.2 mg/dL   GFR, Estimated >38 >75 mL/min    Comment: (NOTE) Calculated using the CKD-EPI Creatinine Equation (2021)    Anion gap 6 5 - 15    Comment: Performed at [facility], [address] [place], [place] [address]  Lipid panel     Status: Abnormal   Collection Time: 06/17/23  4:15 AM  Result Value Ref Range   Cholesterol 156 0 -  200 mg/dL   Triglycerides 409 (H) <150 mg/dL   HDL 43 >81 mg/dL   Total CHOL/HDL Ratio 3.6 RATIO   VLDL 36 0 - 40 mg/dL   LDL Cholesterol 77 0 - 99 mg/dL    Comment:        Total Cholesterol/HDL:CHD Risk Coronary Heart Disease Risk Table                     Men   Women  1/2 Average Risk    3.4   3.3  Average Risk       5.0   4.4  2 X Average Risk   9.6   7.1  3 X Average Risk  23.4   11.0        Use the calculated Patient Ratio above and the CHD Risk Table to determine the patient's CHD Risk.        ATP III CLASSIFICATION (LDL):  <100     mg/dL   Optimal  191-478  mg/dL   Near or Above                    Optimal  130-159  mg/dL   Borderline  295-621  mg/dL   High  >308     mg/dL   Very High Performed at [facility], [address] [place], [place] [address]   CBG monitoring, ED     Status: Abnormal   Collection Time: 06/17/23  4:15 AM  Result Value Ref Range   Glucose-Capillary 181 (H) 70 - 99 mg/dL    Comment: Glucose reference range applies only to samples taken after fasting for at least 8 hours.  Troponin I (High Sensitivity)     Status: Abnormal   Collection Time: 06/17/23  6:10 AM  Result Value Ref Range   Troponin I (High Sensitivity) 57 (H) <18 ng/L    Comment: (NOTE) Elevated high sensitivity troponin I (hsTnI) values and significant  changes across serial measurements may suggest ACS but many other  chronic and acute conditions are known to elevate hsTnI results.  Refer to the "Links" section for chest pain algorithms and additional  guidance. Performed at [facility], [address] [place], [place] [address]   CBG monitoring, ED     Status: Abnormal   Collection Time: 06/17/23  7:57 AM  Result Value Ref Range   Glucose-Capillary 264 (H) 70 - 99 mg/dL    Comment: Glucose reference range applies only to samples taken after fasting for at least 8 hours.  CBG monitoring, ED     Status: Abnormal   Collection Time: 06/17/23 12:59 PM  Result Value Ref Range   Glucose-Capillary 192 (H) 70 - 99 mg/dL    Comment: Glucose reference range applies only to samples taken after fasting for at least 8 hours.   CT Head Wo Contrast  Result Date: 06/17/2023 CLINICAL DATA:  Stroke suspected, eye problem, headache, hypertension EXAM: CT  ANGIOGRAPHY HEAD AND NECK TECHNIQUE: Multidetector CT imaging of the head and neck was performed using the standard protocol during bolus administration of intravenous contrast. Multiplanar CT image reconstructions and MIPs were obtained to evaluate the vascular anatomy. Carotid stenosis measurements (when applicable) are obtained utilizing NASCET criteria, using the distal internal carotid diameter as the denominator. RADIATION DOSE REDUCTION: This exam was performed according to the departmental dose-optimization program which includes automated exposure control, adjustment of the mA and/or kV according to patient size and/or use of  iterative reconstruction technique. CONTRAST:  75mL OMNIPAQUE IOHEXOL 350 MG/ML SOLN COMPARISON:  09/01/2018 CT head, no prior CTA available FINDINGS: CT HEAD FINDINGS Brain: No evidence of acute infarct, hemorrhage, mass, mass effect, or midline shift. No hydrocephalus or extra-axial fluid collection. Vascular: No hyperdense vessel. Skull: Negative for fracture or focal lesion. Sinuses/Orbits: Mucosal thickening in the right maxillary sinus. No acute finding in the orbits. Other: The mastoid air cells are well aerated. CTA NECK FINDINGS Aortic arch: Standard branching. Imaged portion shows no evidence of aneurysm or dissection. No significant stenosis of the major arch vessel origins. Right carotid system: No evidence of stenosis, dissection, or occlusion. Left carotid system: No evidence of stenosis, dissection, or occlusion. Vertebral arteries: No evidence of stenosis, dissection, or occlusion. Skeleton: No acute osseous abnormality. Degenerative changes in the cervical spine. Other neck: No acute finding. Upper chest: No focal pulmonary opacity or pleural effusion. Review of the MIP images confirms the above findings CTA HEAD FINDINGS Anterior circulation: Both internal carotid arteries are patent to the termini, with moderate stenosis in the proximal right cavernous ICA (series 7,  image 117), secondary to noncalcified plaque, with mild poststenotic dilatation. A1 segments patent. Normal anterior communicating artery. Anterior cerebral arteries are patent to their distal aspects without significant stenosis. No M1 stenosis or occlusion. MCA branches perfused to their distal aspects without significant stenosis. Posterior circulation: Vertebral arteries patent to the vertebrobasilar junction with moderate stenosis secondary to noncalcified plaque in the proximal right V4 (series 7, image 148). The proximal right V4 is otherwise irregular. Posterior inferior cerebellar artery patent on the left. Possible common origin of the right AICA and PICA Basilar patent to its distal aspect without significant stenosis. Superior cerebellar arteries patent proximally. Patent P1 segments. PCAs perfused to their distal aspects without significant stenosis. Venous sinuses: As permitted by contrast timing, patent. Anatomic variants: None significant. Review of the MIP images confirms the above findings IMPRESSION: 1. No acute intracranial process. 2. No intracranial large vessel occlusion. 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 3. No hemodynamically significant stenosis in the neck. Electronically Signed   By: Wiliam Ke M.D.   On: 06/17/2023 00:15   CT ANGIO HEAD NECK W WO CM  Result Date: 06/17/2023 CLINICAL DATA:  Stroke suspected, eye problem, headache, hypertension EXAM: CT ANGIOGRAPHY HEAD AND NECK TECHNIQUE: Multidetector CT imaging of the head and neck was performed using the standard protocol during bolus administration of intravenous contrast. Multiplanar CT image reconstructions and MIPs were obtained to evaluate the vascular anatomy. Carotid stenosis measurements (when applicable) are obtained utilizing NASCET criteria, using the distal internal carotid diameter as the denominator. RADIATION DOSE REDUCTION: This exam was performed according to the  departmental dose-optimization program which includes automated exposure control, adjustment of the mA and/or kV according to patient size and/or use of iterative reconstruction technique. CONTRAST:  75mL OMNIPAQUE IOHEXOL 350 MG/ML SOLN COMPARISON:  09/01/2018 CT head, no prior CTA available FINDINGS: CT HEAD FINDINGS Brain: No evidence of acute infarct, hemorrhage, mass, mass effect, or midline shift. No hydrocephalus or extra-axial fluid collection. Vascular: No hyperdense vessel. Skull: Negative for fracture or focal lesion. Sinuses/Orbits: Mucosal thickening in the right maxillary sinus. No acute finding in the orbits. Other: The mastoid air cells are well aerated. CTA NECK FINDINGS Aortic arch: Standard branching. Imaged portion shows no evidence of aneurysm or dissection. No significant stenosis of the major arch vessel origins. Right carotid system: No evidence of stenosis, dissection, or occlusion.  Left carotid system: No evidence of stenosis, dissection, or occlusion. Vertebral arteries: No evidence of stenosis, dissection, or occlusion. Skeleton: No acute osseous abnormality. Degenerative changes in the cervical spine. Other neck: No acute finding. Upper chest: No focal pulmonary opacity or pleural effusion. Review of the MIP images confirms the above findings CTA HEAD FINDINGS Anterior circulation: Both internal carotid arteries are patent to the termini, with moderate stenosis in the proximal right cavernous ICA (series 7, image 117), secondary to noncalcified plaque, with mild poststenotic dilatation. A1 segments patent. Normal anterior communicating artery. Anterior cerebral arteries are patent to their distal aspects without significant stenosis. No M1 stenosis or occlusion. MCA branches perfused to their distal aspects without significant stenosis. Posterior circulation: Vertebral arteries patent to the vertebrobasilar junction with moderate stenosis secondary to noncalcified plaque in the proximal  right V4 (series 7, image 148). The proximal right V4 is otherwise irregular. Posterior inferior cerebellar artery patent on the left. Possible common origin of the right AICA and PICA Basilar patent to its distal aspect without significant stenosis. Superior cerebellar arteries patent proximally. Patent P1 segments. PCAs perfused to their distal aspects without significant stenosis. Venous sinuses: As permitted by contrast timing, patent. Anatomic variants: None significant. Review of the MIP images confirms the above findings IMPRESSION: 1. No acute intracranial process. 2. No intracranial large vessel occlusion. 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 3. No hemodynamically significant stenosis in the neck. Electronically Signed   By: Wiliam Ke M.D.   On: 06/17/2023 00:15   DG Chest 2 View  Result Date: 06/16/2023 CLINICAL DATA:  CP EXAM: CHEST - 2 VIEW COMPARISON:  02/14/2014. FINDINGS: The heart size and mediastinal contours are within normal limits. Both lungs are clear. The visualized skeletal structures are unremarkable. IMPRESSION: No acute cardiopulmonary disease. Electronically Signed   By: Layla Maw M.D.   On: 06/16/2023 19:17    Pending Labs Unresulted Labs (From admission, onward)     Start     Ordered   06/18/23 0500  Basic metabolic panel  Tomorrow morning,   R        06/17/23 1219   06/18/23 0500  Magnesium  Tomorrow morning,   R        06/17/23 1219            Vitals/Pain Today's Vitals   06/17/23 0812 06/17/23 1000 06/17/23 1004 06/17/23 1200  BP:  130/76 125/76 (!) 141/89  Pulse:  93 92 90  Resp:   17 16  Temp: 98.3 F (36.8 C)   97.6 F (36.4 C)  TempSrc: Oral   Oral  SpO2:  100% 100% 100%  Weight:      Height:      PainSc:        Isolation Precautions No active isolations  Medications Medications  insulin aspart (novoLOG) injection 0-6 Units (1 Units Subcutaneous Given 06/17/23 1307)  insulin aspart  (novoLOG) injection 0-5 Units (has no administration in time range)  hydrALAZINE (APRESOLINE) injection 10 mg (has no administration in time range)  insulin aspart protamine- aspart (NOVOLOG MIX 70/30) injection 40 Units (40 Units Subcutaneous Given 06/17/23 0807)  enoxaparin (LOVENOX) injection 40 mg (40 mg Subcutaneous Patient Refused/Not Given 06/17/23 1330)  sodium chloride flush (NS) 0.9 % injection 3 mL (3 mLs Intravenous Given 06/17/23 0900)  sodium chloride flush (NS) 0.9 % injection 3 mL (has no administration in time range)  0.9 %  sodium chloride infusion (has no administration in  time range)  acetaminophen (TYLENOL) tablet 650 mg (has no administration in time range)    Or  acetaminophen (TYLENOL) suppository 650 mg (has no administration in time range)  ondansetron (ZOFRAN) tablet 4 mg (has no administration in time range)    Or  ondansetron (ZOFRAN) injection 4 mg (has no administration in time range)  carvedilol (COREG) tablet 3.125 mg (3.125 mg Oral Given 06/17/23 0735)  atorvastatin (LIPITOR) tablet 20 mg (20 mg Oral Given 06/17/23 0906)  aspirin chewable tablet 81 mg (has no administration in time range)  amLODipine (NORVASC) tablet 5 mg (5 mg Oral Given 06/17/23 0906)  lactated ringers bolus 2,000 mL (0 mLs Intravenous Stopped 06/16/23 2329)  metoCLOPramide (REGLAN) injection 10 mg (10 mg Intravenous Given 06/16/23 2035)  diphenhydrAMINE (BENADRYL) injection 12.5 mg (12.5 mg Intravenous Given 06/16/23 2035)  aspirin chewable tablet 324 mg (324 mg Oral Given 06/16/23 2034)  iohexol (OMNIPAQUE) 350 MG/ML injection 75 mL (75 mLs Intravenous Contrast Given 06/16/23 2219)  metoprolol tartrate (LOPRESSOR) tablet 25 mg (25 mg Oral Given 06/16/23 2330)  insulin aspart (novoLOG) injection 10 Units (10 Units Subcutaneous Given 06/16/23 2331)  potassium chloride (KLOR-CON M) CR tablet 30 mEq (30 mEq Oral Given 06/17/23 1158)    Mobility Walks, might need assistance due to vision issues      Focused Assessments Cardiac Assessment Handoff:  Cardiac Rhythm: Sinus tachycardia No results found for: "CKTOTAL", "CKMB", "CKMBINDEX", "TROPONINI" No results found for: "DDIMER" Does the Patient currently have chest pain? No    R Recommendations: See Admitting Provider Note  Report given to:   Additional Notes: spanish speaking, has vision issues.

## 2023-06-17 NOTE — Consult Note (Addendum)
Cardiology Consultation   Patient ID: Drew [name] MRN: [id number]; DOB: Aug 04, 1976  Admit date: 06/16/2023 Date of Consult: 06/17/2023  PCP:  Patient, No Pcp Per   [facility] Providers Cardiologist:  None        Patient Profile:   Drew [name] is a 47 y.o. male with a hx of primary hypertension and type 2 diabetes c/b proliferative diabetic retinopathy who is being seen 06/17/2023 for the evaluation of chest pain at the request of ED.   History of Present Illness:   Encounter was completed with video interpreter Drew [name] [phone]   Drew [name] presents to ED today for 2 months of ongoing vision changes. He has been having vision loss in both eyes, with occasional periods of complete vision loss. He was seen by ophthalmology yesterday with plans for conservative management. Patient presented to the ED given continued concerns.  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 He states the pain is mild and radiates to his back. The chest pain started about 3 months ago and has been stable since. He denies any changes in his chest pain symptoms. CP resolves with Ibuprofen. On evaluation, patient is chest pain free. Chest pain is not reproducible with palpation.   EKG ST. Labs notable for hyperglycemia, troponin 48-->54, AP 150, and Mag 2.2.   Patient is a non smoker. No family history of premature ASCVD.    Past Medical History:  Diagnosis Date   Diabetes mellitus without complication (HCC)     History reviewed. No pertinent surgical history.     Inpatient Medications: Scheduled Meds:  nitroGLYCERIN  1 inch Topical Once   Continuous Infusions:  PRN Meds:   Allergies:   No Known Allergies  Social History:   Social History   Socioeconomic History   Marital status: Single    Spouse name: Not on file   Number of children: Not on file   Years of education: Not on file    Highest education level: Not on file  Occupational History   Not on file  Tobacco Use   Smoking status: Never   Smokeless tobacco: Never  Substance and Sexual Activity   Alcohol use: No   Drug use: No   Sexual activity: Not on file  Other Topics Concern   Not on file  Social History Narrative   Not on file   Social Determinants of Health   Financial Resource Strain: Not on file  Food Insecurity: Not on file  Transportation Needs: Not on file  Physical Activity: Not on file  Stress: Not on file  Social Connections: Not on file  Intimate Partner Violence: Not on file    Family History:   History reviewed. No pertinent family history.   ROS:  Please see the history of present illness.   All other ROS reviewed and negative.     Physical Exam/Data:   Vitals:   06/16/23 2315 06/16/23 2330 06/17/23 0030 06/17/23 0130  BP: (!) 186/99 (!) 195/99 (!) 159/88 (!) 149/88  Pulse: (!) 108 (!) 104 92 91  Resp:  19 18   Temp:   98.4 F (36.9 C)   TempSrc:      SpO2: 98% 98% 99% 99%  Weight:      Height:       No intake or output data in the 24 hours ending 06/17/23 0309    06/16/2023    5:19 PM 09/01/2018  11:40 AM 02/15/2014    6:27 AM  Last 3 Weights  Weight (lbs) 132 lb 4.4 oz 165 lb 143 lb 11.8 oz  Weight (kg) 60 kg 74.844 kg 65.2 kg     Body mass index is 23.44 kg/m.  General:  Well nourished, well developed, in no acute distress HEENT: normal Neck: no JVD Vascular: No carotid bruits; Distal pulses 2+ bilaterally Cardiac:  normal S1, S2; RRR; no murmur  Lungs:  clear to auscultation bilaterally, no wheezing, rhonchi or rales  Abd: soft, nontender, no hepatomegaly  Ext: no edema Musculoskeletal:  No deformities, BUE and BLE strength normal and equal Skin: warm and dry  Neuro:  CNs 2-12 intact, no focal abnormalities noted Psych:  Normal affect   EKG:  The EKG was personally reviewed and demonstrates:  ST  Laboratory Data:  High Sensitivity Troponin:    Recent Labs  Lab 06/16/23 1727 06/16/23 2159  TROPONINIHS 48* 54*     Chemistry Recent Labs  Lab 06/16/23 1727 06/16/23 1738 06/16/23 1851  NA 134* 134*  --   K 4.5 4.6  --   CL 95* 102  --   CO2 21*  --   --   GLUCOSE 499* 507*  --   BUN 14 18  --   CREATININE 1.07 1.00  --   CALCIUM 9.4  --   --   MG  --   --  2.2  GFRNONAA >60  --   --   ANIONGAP 18*  --   --     Recent Labs  Lab 06/16/23 1851  PROT 7.7  ALBUMIN 3.4*  AST 25  ALT 33  ALKPHOS 150*  BILITOT 0.8   Lipids No results for input(s): "CHOL", "TRIG", "HDL", "LABVLDL", "LDLCALC", "CHOLHDL" in the last 168 hours.  Hematology Recent Labs  Lab 06/16/23 1727 06/16/23 1738  WBC 6.1  --   RBC 5.01  --   HGB 15.2 14.6  HCT 43.3 43.0  MCV 86.4  --   MCH 30.3  --   MCHC 35.1  --   RDW 12.0  --   PLT 217  --    Thyroid No results for input(s): "TSH", "FREET4" in the last 168 hours.  BNPNo results for input(s): "BNP", "PROBNP" in the last 168 hours.  DDimer No results for input(s): "DDIMER" in the last 168 hours.   Radiology/Studies:  CT Head Wo Contrast  Result Date: 06/17/2023 CLINICAL DATA:  Stroke suspected, eye problem, headache, hypertension EXAM: CT ANGIOGRAPHY HEAD AND NECK TECHNIQUE: Multidetector CT imaging of the head and neck was performed using the standard protocol during bolus administration of intravenous contrast. Multiplanar CT image reconstructions and MIPs were obtained to evaluate the vascular anatomy. Carotid stenosis measurements (when applicable) are obtained utilizing NASCET criteria, using the distal internal carotid diameter as the denominator. RADIATION DOSE REDUCTION: This exam was performed according to the departmental dose-optimization program which includes automated exposure control, adjustment of the mA and/or kV according to patient size and/or use of iterative reconstruction technique. CONTRAST:  75mL OMNIPAQUE IOHEXOL 350 MG/ML SOLN COMPARISON:  09/01/2018 CT head, no  prior CTA available FINDINGS: CT HEAD FINDINGS Brain: No evidence of acute infarct, hemorrhage, mass, mass effect, or midline shift. No hydrocephalus or extra-axial fluid collection. Vascular: No hyperdense vessel. Skull: Negative for fracture or focal lesion. Sinuses/Orbits: Mucosal thickening in the right maxillary sinus. No acute finding in the orbits. Other: The mastoid air cells are well aerated. CTA NECK FINDINGS Aortic arch:  Standard branching. Imaged portion shows no evidence of aneurysm or dissection. No significant stenosis of the major arch vessel origins. Right carotid system: No evidence of stenosis, dissection, or occlusion. Left carotid system: No evidence of stenosis, dissection, or occlusion. Vertebral arteries: No evidence of stenosis, dissection, or occlusion. Skeleton: No acute osseous abnormality. Degenerative changes in the cervical spine. Other neck: No acute finding. Upper chest: No focal pulmonary opacity or pleural effusion. Review of the MIP images confirms the above findings CTA HEAD FINDINGS Anterior circulation: Both internal carotid arteries are patent to the termini, with moderate stenosis in the proximal right cavernous ICA (series 7, image 117), secondary to noncalcified plaque, with mild poststenotic dilatation. A1 segments patent. Normal anterior communicating artery. Anterior cerebral arteries are patent to their distal aspects without significant stenosis. No M1 stenosis or occlusion. MCA branches perfused to their distal aspects without significant stenosis. Posterior circulation: Vertebral arteries patent to the vertebrobasilar junction with moderate stenosis secondary to noncalcified plaque in the proximal right V4 (series 7, image 148). The proximal right V4 is otherwise irregular. Posterior inferior cerebellar artery patent on the left. Possible common origin of the right AICA and PICA Basilar patent to its distal aspect without significant stenosis. Superior cerebellar  arteries patent proximally. Patent P1 segments. PCAs perfused to their distal aspects without significant stenosis. Venous sinuses: As permitted by contrast timing, patent. Anatomic variants: None significant. Review of the MIP images confirms the above findings IMPRESSION: 1. No acute intracranial process. 2. No intracranial large vessel occlusion. 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 3. No hemodynamically significant stenosis in the neck. Electronically Signed   By: Wiliam Ke M.D.   On: 06/17/2023 00:15   CT ANGIO HEAD NECK W WO CM  Result Date: 06/17/2023 CLINICAL DATA:  Stroke suspected, eye problem, headache, hypertension EXAM: CT ANGIOGRAPHY HEAD AND NECK TECHNIQUE: Multidetector CT imaging of the head and neck was performed using the standard protocol during bolus administration of intravenous contrast. Multiplanar CT image reconstructions and MIPs were obtained to evaluate the vascular anatomy. Carotid stenosis measurements (when applicable) are obtained utilizing NASCET criteria, using the distal internal carotid diameter as the denominator. RADIATION DOSE REDUCTION: This exam was performed according to the departmental dose-optimization program which includes automated exposure control, adjustment of the mA and/or kV according to patient size and/or use of iterative reconstruction technique. CONTRAST:  75mL OMNIPAQUE IOHEXOL 350 MG/ML SOLN COMPARISON:  09/01/2018 CT head, no prior CTA available FINDINGS: CT HEAD FINDINGS Brain: No evidence of acute infarct, hemorrhage, mass, mass effect, or midline shift. No hydrocephalus or extra-axial fluid collection. Vascular: No hyperdense vessel. Skull: Negative for fracture or focal lesion. Sinuses/Orbits: Mucosal thickening in the right maxillary sinus. No acute finding in the orbits. Other: The mastoid air cells are well aerated. CTA NECK FINDINGS Aortic arch: Standard branching. Imaged portion shows no evidence of  aneurysm or dissection. No significant stenosis of the major arch vessel origins. Right carotid system: No evidence of stenosis, dissection, or occlusion. Left carotid system: No evidence of stenosis, dissection, or occlusion. Vertebral arteries: No evidence of stenosis, dissection, or occlusion. Skeleton: No acute osseous abnormality. Degenerative changes in the cervical spine. Other neck: No acute finding. Upper chest: No focal pulmonary opacity or pleural effusion. Review of the MIP images confirms the above findings CTA HEAD FINDINGS Anterior circulation: Both internal carotid arteries are patent to the termini, with moderate stenosis in the proximal right cavernous ICA (series 7, image 117), secondary  to noncalcified plaque, with mild poststenotic dilatation. A1 segments patent. Normal anterior communicating artery. Anterior cerebral arteries are patent to their distal aspects without significant stenosis. No M1 stenosis or occlusion. MCA branches perfused to their distal aspects without significant stenosis. Posterior circulation: Vertebral arteries patent to the vertebrobasilar junction with moderate stenosis secondary to noncalcified plaque in the proximal right V4 (series 7, image 148). The proximal right V4 is otherwise irregular. Posterior inferior cerebellar artery patent on the left. Possible common origin of the right AICA and PICA Basilar patent to its distal aspect without significant stenosis. Superior cerebellar arteries patent proximally. Patent P1 segments. PCAs perfused to their distal aspects without significant stenosis. Venous sinuses: As permitted by contrast timing, patent. Anatomic variants: None significant. Review of the MIP images confirms the above findings IMPRESSION: 1. No acute intracranial process. 2. No intracranial large vessel occlusion. 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 3. No hemodynamically significant stenosis in the neck.  Electronically Signed   By: Wiliam Ke M.D.   On: 06/17/2023 00:15   DG Chest 2 View  Result Date: 06/16/2023 CLINICAL DATA:  CP EXAM: CHEST - 2 VIEW COMPARISON:  02/14/2014. FINDINGS: The heart size and mediastinal contours are within normal limits. Both lungs are clear. The visualized skeletal structures are unremarkable. IMPRESSION: No acute cardiopulmonary disease. Electronically Signed   By: Layla Maw M.D.   On: 06/16/2023 19:17     Assessment and Plan:   NSTEMI-- type 2, supply demand mismatch in setting of hypertensive urgency  Non cardiac chest pain Hyperglycemia  Type 2 diabetes mellitus complicated by proliferative retinopathy  Troponin elevation in setting of hypertensive urgency with presenting BP of 208/102. CP appears non cardiac, but with risk factors of DM, HTN, will need TTE and outpatient stress test.  - Aggressive primary prevention control (DM, HTN) - Outpatient stress test - Trend troponin till peak  - TTE    Risk Assessment/Risk Scores:        For questions or updates, please contact [facility] Please consult [email or web address] for contact info under    Signed, Lavonia Dana MD [facility] 06/17/2023 3:09 AM  Patient seen after echo.  Agree as above, with the following exceptions and changes as noted below. Pt without chest pain but does have a headache he attributes to his vision issues. Gen: NAD, CV: RRR, no murmurs, Lungs: clear, Abd: soft, Extrem: Warm, well perfused, no edema, Neuro/Psych: alert and oriented x 3, normal mood and affect. All available labs, radiology testing, previous records reviewed. Echo normal, personally reviewed. Likely demand ischemia in setting of HTN emergency. If pt feels well with better BP control, recommend outpatient cardiology consult (we will arrange) and stress testing as outpatient with stress cardiac PET CT.  Parke Poisson, MD 06/17/23 7:08 PM

## 2023-06-17 NOTE — ED Provider Notes (Signed)
Blood pressure (!) 195/99, pulse (!) 104, temperature 98.3 F (36.8 C), resp. rate 19, height 5' 2.99" (1.6 m), weight 60 kg, SpO2 98%.  Assuming care from Dr. Durwin Nora.  In short, Drew [name] is a 47 y.o. male with a chief complaint of Eye Problem, Headache, Hypertension, and Chest Pain .  Refer to the original H&P for additional details.  The current plan of care is to follow up on imaging and reassess. Patient with months of progressive vision loss.  12:07 AM  Spoke with Cardiology who will consult.   12:33 AM  CTA of the head and neck showed no large vessel occlusion.  He does have some moderate stenosis on exam.  CT head without acute finding.  I discussed the patient's results including labs with him using the Spanish interpreter.  Plan for admit for tighter control of his blood sugar, blood pressure in the setting of elevated troponins, and cardiology consultation.  I have listed the contact information for ophthalmology for a second opinion upon discharge but there is no acute intervention indicated for his vision loss at this point.  I discussed with him that to best preserve his remaining vision we should better control his blood pressure and blood sugars.  He is in agreement with this and will page the unassigned hospitalist for admission.   01:15 AM  Discussed patient's case with [facility], Dr. Janalyn Shy to request admission. Patient and family (if present) updated with plan.   I reviewed all nursing notes, vitals, pertinent old records, EKGs, labs, imaging (as available).    Maia Plan, MD 06/17/23 [phone]

## 2023-06-17 NOTE — Inpatient Diabetes Management (Signed)
Inpatient Diabetes Program Recommendations  AACE/ADA: New Consensus Statement on Inpatient Glycemic Control (2015)  Target Ranges:  Prepandial:   less than 140 mg/dL      Peak postprandial:   less than 180 mg/dL (1-2 hours)      Critically ill patients:  140 - 180 mg/dL   Lab Results  Component Value Date   GLUCAP 192 (H) 06/17/2023   HGBA1C 9.9 (H) 06/17/2023    Review of Glycemic Control  Diabetes history: DM 2 Outpatient Diabetes medications:  70/30 30 units bid Current orders for Inpatient glycemic control:  70/30 40 units bid   Spoke with pt at bedside through interpreter [place] # [phone]. Pt reports self administering and dosing insulin for about 1 year, pt does not have a PCP. Pt has been getting his insulin via vial and syringe over the counter at [facility]. Spoke to pt about the insulin pen and pt reports he wants to try the insulin pen in the future especially if his vision continues to be impaired. Pt reports having a glucometer at home. I reinforced to check his glucose twice a day. Pt reports eating a diabetic diet at home and drinking water.  Staff came to transfer pt upstairs.  Thanks,  Christena Deem RN, MSN, BC-ADM Inpatient Diabetes Coordinator Team Pager [phone] (8a-5p)

## 2023-06-17 NOTE — Evaluation (Signed)
Physical Therapy Evaluation/ Discharge Patient Details Name: Drew [name] MRN: [id number] DOB: [birth year] Today's Date: 06/17/2023  History of Present Illness  47 yo male admitted 9/16 with progressive vision loss, HTN emergency with demand ischemia related NSTEMI, hyperglycemia and HA. 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 Pt works at [employer] and lives alone. He has had transportation to work but not the grocery store so he is currently eating one meal a day from work as he has nothing else available at home. He is getting around in familiar environments due to memory and limited vision in Lt eye. Pt has no family assist as his wife and others live in [place]. Pt moving well, able to don shoes and walk in hall with decreased speed for safety but no LOB. Pt without significant strength, balance or functional deficits or need for further acute P.T.  Pt would benefit from services for the blind and transportation assist. Will sign off with pt aware and agreeable.       If plan is discharge home, recommend the following: Assist for transportation   Can travel by private vehicle        Equipment Recommendations None recommended by PT  Recommendations for Other Services       Functional Status Assessment Patient has not had a recent decline in their functional status     Precautions / Restrictions Precautions Precautions: Other (comment) Precaution Comments: limited vision      Mobility  Bed Mobility Overal bed mobility: Independent                  Transfers Overall transfer level: Independent                      Ambulation/Gait Ambulation/Gait assistance: Supervision Gait Distance (Feet): 300 Feet Assistive device: None Gait Pattern/deviations: WFL(Within Functional Limits)   Gait velocity  interpretation: 1.31 - 2.62 ft/sec, indicative of limited community ambulator   General Gait Details: supervision for guidance in hall due to vision impairment  Stairs            Wheelchair Mobility     Tilt Bed    Modified Rankin (Stroke Patients Only)       Balance Overall balance assessment: No apparent balance deficits (not formally assessed)                                           Pertinent Vitals/Pain Pain Assessment Pain Assessment: No/denies pain    Home Living Family/patient expects to be discharged to:: Private residence Living Arrangements: Alone   Type of Home: Apartment Home Access: Stairs to enter   [employer] of Steps: 14   Home Layout: One level Home Equipment: None      Prior Function Prior Level of Function : Working/employed;Independent/Modified Independent             Mobility Comments: pt does not have AD and at times runs into walls due to impaired vision ADLs Comments: can see within a few feet of himself to manage items, 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, does not drive     Extremity/Trunk Assessment   Upper Extremity Assessment Upper Extremity Assessment: Overall [facility]  for tasks assessed    Lower Extremity Assessment Lower Extremity Assessment: Overall WFL for tasks assessed    Cervical / Trunk Assessment Cervical / Trunk Assessment: Normal  Communication   Communication Communication: Other (comment) (Spanish speaking with interpreter utilized Drew [name] [phone])  Cognition Arousal: Alert Behavior During Therapy: WFL for tasks assessed/performed Overall Cognitive Status: Within Functional Limits for tasks assessed                                          General Comments      Exercises     Assessment/Plan    PT Assessment Patient does not need any further PT services  PT Problem List         PT Treatment Interventions       PT Goals (Current goals can be found in the Care Plan section)  Acute Rehab PT Goals PT Goal Formulation: All assessment and education complete, DC therapy    Frequency       Co-evaluation               AM-PAC PT "6 Clicks" Mobility  Outcome Measure Help needed turning from your back to your side while in a flat bed without using bedrails?: None Help needed moving from lying on your back to sitting on the side of a flat bed without using bedrails?: None Help needed moving to and from a bed to a chair (including a wheelchair)?: None Help needed standing up from a chair using your arms (e.g., wheelchair or bedside chair)?: None Help needed to walk in hospital room?: A Little Help needed climbing 3-5 steps with a railing? : None 6 Click Score: 23    End of Session   Activity Tolerance: Patient tolerated treatment well Patient left: in chair;with call bell/phone within reach Nurse Communication: Mobility status PT Visit Diagnosis: Other abnormalities of gait and mobility (R26.89)    Time: 4401-0272 PT Time Calculation (min) (ACUTE ONLY): 25 min   Charges:   PT Evaluation $PT Eval Moderate Complexity: 1 Mod   PT General Charges $$ ACUTE PT VISIT: 1 Visit         Drew [name], PT Acute Rehabilitation Services Office: [phone]   Drew [name] 06/17/2023, 1:33 PM

## 2023-06-17 NOTE — Progress Notes (Signed)
Echocardiogram 2D Echocardiogram has been performed.  Drew [name] 06/17/2023, 4:25 PM

## 2023-06-18 ENCOUNTER — Other Ambulatory Visit (HOSPITAL_COMMUNITY): Payer: Self-pay

## 2023-06-18 LAB — GLUCOSE, CAPILLARY
Glucose-Capillary: 168 mg/dL — ABNORMAL HIGH (ref 70–99)
Glucose-Capillary: 263 mg/dL — ABNORMAL HIGH (ref 70–99)
Glucose-Capillary: 225 mg/dL — ABNORMAL HIGH (ref 70–99)

## 2023-06-18 MED ORDER — "ULTICARE INSULIN SYRINGE 30G X 1/2"" 0.5 ML MISC"
0 refills | Status: DC
  Filled 2023-06-18: qty 100, 50d supply, fill #0

## 2023-06-18 MED ORDER — BLOOD GLUCOSE TEST VI STRP
1.0000 | ORAL_STRIP | Freq: Three times a day (TID) | 0 refills | Status: DC
  Filled 2023-06-18: qty 100, 34d supply, fill #0

## 2023-06-18 MED ORDER — NOVOLIN 70/30 (70-30) 100 UNIT/ML ~~LOC~~ SUSP
40.0000 [IU] | Freq: Two times a day (BID) | SUBCUTANEOUS | 11 refills | Status: DC
  Filled 2023-06-18: qty 10, 13d supply, fill #0

## 2023-06-18 MED ORDER — BLOOD GLUCOSE MONITOR SYSTEM W/DEVICE KIT
1.0000 | PACK | Freq: Three times a day (TID) | 0 refills | Status: DC
  Filled 2023-06-18: qty 1, 30d supply, fill #0

## 2023-06-18 MED ORDER — ASPIRIN 81 MG PO CHEW
81.0000 mg | CHEWABLE_TABLET | Freq: Every day | ORAL | 0 refills | Status: DC
  Filled 2023-06-18: qty 90, 90d supply, fill #0
  Filled 2023-06-18: qty 30, 30d supply, fill #0

## 2023-06-18 MED ORDER — CARVEDILOL 3.125 MG PO TABS
3.1250 mg | ORAL_TABLET | Freq: Two times a day (BID) | ORAL | 0 refills | Status: DC
  Filled 2023-06-18 – 2023-07-11 (×3): qty 60, 30d supply, fill #0

## 2023-06-18 MED ORDER — POTASSIUM CHLORIDE CRYS ER 20 MEQ PO TBCR
40.0000 meq | EXTENDED_RELEASE_TABLET | Freq: Once | ORAL | Status: AC
  Administered 2023-06-18: 40 meq via ORAL
  Filled 2023-06-18: qty 2

## 2023-06-18 MED ORDER — ACCU-CHEK SOFTCLIX LANCETS MISC
1.0000 | Freq: Three times a day (TID) | 0 refills | Status: DC
  Filled 2023-06-18: qty 100, 34d supply, fill #0

## 2023-06-18 MED ORDER — AMLODIPINE BESYLATE 5 MG PO TABS
5.0000 mg | ORAL_TABLET | Freq: Every day | ORAL | 0 refills | Status: DC
  Filled 2023-06-18 – 2023-07-11 (×3): qty 30, 30d supply, fill #0

## 2023-06-18 MED ORDER — NOVOLIN 70/30 (70-30) 100 UNIT/ML ~~LOC~~ SUSP: 40.0000 [IU] | Freq: Two times a day (BID) | SUBCUTANEOUS | 11 refills | Status: DC

## 2023-06-18 MED ORDER — ATORVASTATIN CALCIUM 20 MG PO TABS
20.0000 mg | ORAL_TABLET | Freq: Every day | ORAL | 0 refills | Status: DC
  Filled 2023-06-18 – 2023-07-11 (×3): qty 30, 30d supply, fill #0

## 2023-06-18 NOTE — Discharge Summary (Signed)
Physician Discharge Summary  Drew [name] JYN:[id number] DOB: Dec 24, 1975 DOA: 06/16/2023  PCP: Patient, No Pcp Per  Admit date: 06/16/2023 Discharge date: 06/18/2023  Time spent: 40 minutes  Recommendations for Outpatient Follow-up:  Follow outpatient CBC/CMP  Needs ophthalmology follow up outpatient Needs PCP follow up outpatient Needs cardiology follow up outpatient - consider stress test Follow blood pressure, adjust as needed Follow carotid artery stenosis Recently started statin, follow LFT's/TSH outpatient  Discharge Diagnoses:  Principal Problem:   Hyperglycemic crisis due to diabetes mellitus (HCC) Active Problems:   NSTEMI (non-ST elevated myocardial infarction) (HCC)   Hypertensive emergency   Diabetic retinopathy (HCC)   Blindness of right eye   Hypertensive urgency   Headache   Insulin dependent type 1 diabetes mellitus (HCC)   AKI (acute kidney injury) (HCC)   Discharge Condition: stable  Diet recommendation: heart healthy, diabetic  Filed Weights   06/16/23 1719 06/18/23 0500  Weight: 60 kg 70.5 kg    History of present illness:   Drew [name] is Drew [name] 47 y.o. male with past medical history significant for type 1 diabetes mellitus, diabetic retinopathy associated with right sided complete blindness and left-sided partial visual loss who presented to [facility] ED on 9/16 for evaluation of headache, chest pain and vision loss.  He reports complete loss of vision right eye last month and decreased vision in the left eye over the same timeframe.  Saw ophthalmologist outpatient; unfortunately nothing can be done for his progressive diabetic retinopathy at this time.  Patient endorsing headache and chest pain.  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  Onset 3 months ago.  Denies any associated palpitations, no diaphoresis.   He was admitted for hyperglycemia, chest pain, and hypertensive emergency.  His blood sugars have  improved on his home diabetes regimen.  He's been seen by cardiology, recommending outpatient follow up and stress test.  Blood pressures have improved with treatment.  Ophthalmology recommending outpatient follow up for vision loss.  See below for additional details  Hospital Course:  Assessment and Plan:  Hypertensive emergency -- BP improved today -- TTE: EF 55-60%, no RWMA, mild LVH (see report) -- Amlodipine 5 mg p.o. daily -- Carvedilol 3.125 mg p.o. [name] -- Started on aspirin and statin   Type 1 diabetes mellitus with hyperglycemia Diabetic retinopathy, progressive Hemoglobin A1c 9.9, poorly controlled.  Not followed by PCP for management of his diabetes.  Patient reports he purchases his insulin at [facility].  Glucose 510 on admission.  Home regimen includes NPH 40 units twice Drew [name] day.  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  Drew [name] recommended against surgery, he has follow up appointment with Drew [name] in October.  Our EDP consulted ophthalmology with recommendation of conservative management and outpatient follow-up for long-term care. -- Diabetic educator consulted -- NPH 40u Drew [name] -- SSI for coverage -- CBGs qAC/HS -- needs Drew [name] PCP, TOC consulted to help with follow up appt   Elevated troponin secondary to type II demand ischemia Patient presenting with chest pain, troponin noted to be elevated at 48, serial monitoring noted to be flat at 57.  EKG with normal sinus rhythm, rate 105, QTc 462, no concerning ST elevation/depressions or T wave inversions.  Etiology likely secondary to hypertensive emergency as above.  Seen by cardiology. --Cardiology following, appreciate  assistance -> recommending outpatient stress test, referral placed -- TTE: EF 55-60%, no RWMA, mild LVH (see report)   -- BP control as above -- Continue monitor on telemetry   Dyslipidemia Lipid panel with total cholesterol 156, HDL 43, LDL 77, triglycerides 181. -- Atorvastatin 20 mg p.o. daily  Acute renal failure: Resolved Creatinine on presentation elevated 1.07.  Baseline 0.5.   Resolved, likely related to dehydration  Vision Loss Due to diabetic retinopathy as above Resources for blind to be provided by Drew [name]  Carotid Artery Stenosis Moderate stenosis in proximal R cavernous ICA and proximal R V4 Follow outpatient     Procedures: Echo IMPRESSIONS     1. Left ventricular ejection fraction, by estimation, is 55 to 60%. The  left ventricle has normal function. The left ventricle has no regional  wall motion abnormalities. There is mild left ventricular hypertrophy.  Left ventricular diastolic parameters  were normal.   2. Right ventricular systolic function is normal. The right ventricular  size is normal. There is normal pulmonary artery systolic pressure. The  estimated right ventricular systolic pressure is 23.7 mmHg.   3. The mitral valve is normal in structure. Trivial mitral valve  regurgitation. No evidence of mitral stenosis.   4. The aortic valve is tricuspid. Aortic valve regurgitation is trivial.  No aortic stenosis is present.   5. The inferior vena cava is dilated in size with >50% respiratory  variability, suggesting right atrial pressure of 8 mmHg.    Consultations: cardiology  Discharge Exam: Vitals:   06/18/23 0532 06/18/23 0737  BP: 128/74 136/85  Pulse: 84 85  Resp: 18   Temp: 98.2 F (36.8 C) 98.2 F (36.8 C)  SpO2: 99% 98%   No complaints  General: No acute distress. Cardiovascular: RRR Lungs: unlabored Neurological: Alert and oriented 3. Moves all extremities 4 with equal strength. Cranial nerves II through XII grossly intact. Extremities: No clubbing or cyanosis. No edema.   Discharge Instructions   Discharge Instructions     Ambulatory  referral to Cardiology   Complete by: As directed    Needs spanish interpreter   Call MD for:  difficulty breathing, headache or visual disturbances   Complete by: As directed    Call MD for:  extreme fatigue   Complete by: As directed    Call MD for:  hives   Complete by: As directed    Call MD for:  persistant dizziness or light-headedness   Complete by: As directed    Call MD for:  persistant nausea and vomiting   Complete by: As directed    Call MD for:  redness, tenderness, or signs of infection (pain, swelling, redness, odor or green/yellow discharge around incision site)   Complete by: As directed    Call MD for:  severe uncontrolled pain   Complete by: As directed    Call MD for:  temperature >100.4   Complete by: As directed    Diet - low sodium heart healthy   Complete by: As directed    Discharge instructions   Complete by: As directed    You were seen for issues with your blood sugar.  This has improved after resuming your home diabetes medicines.  It's important that you follow with an outpatient primary care provider to help manage your diabetes as an outpatient.  Because of your vision, the insulin pen may be easier to use going forward.  You can pick this up the next time you need Kinslee Dalpe refill from  [facility].  You had chest pain and were seen by cardiology.  Your ultrasound of your heart (echocardiogram) was generally reassuring.  We've started you on lipitor (Cayleigh Paull cholesterol lowering medicine) due to your overall cardiovascular risk in addition to aspirin (avoid medicines like NSAIDs - ibuprofen, naproxen, aleve, etc. - or goody powders while taking aspirin).  Cardiology will arrange an outpatient stress test for you.  You should follow up with cardiology as an outpatient.   You have proliferative diabetic retinopathy.  You'll need continued follow up with your ophthalmologist as an outpatient.  You had narrowing of your carotid artery on the right that should be followed  outpatient.   Return for new, recurrent, or worsening issues.  Please ask your PCP to request records from this hospitalization so they know what was done and what the next steps will be.   Increase activity slowly   Complete by: As directed       Allergies as of 06/18/2023   No Known Allergies      Medication List     STOP taking these medications    amoxicillin 500 MG capsule Commonly known as: AMOXIL   ibuprofen 200 MG tablet Commonly known as: ADVIL   trimethoprim-polymyxin b ophthalmic solution Commonly known as: Polytrim       TAKE these medications    amLODipine 5 MG tablet Commonly known as: NORVASC Take 1 tablet (5 mg total) by mouth daily.   aspirin 81 MG chewable tablet Chew 1 tablet (81 mg total) by mouth daily.   atorvastatin 20 MG tablet Commonly known as: LIPITOR Take 1 tablet (20 mg total) by mouth daily.   Blood Glucose Monitoring Suppl Devi 1 each by Does not apply route 3 (three) times daily. May dispense any manufacturer covered by patient's insurance.   BLOOD GLUCOSE TEST STRIPS Strp 1 each by Does not apply route 3 (three) times daily. Use as directed to check blood sugar. May dispense any manufacturer covered by patient's insurance and fits patient's device.   carvedilol 3.125 MG tablet Commonly known as: COREG Take 1 tablet (3.125 mg total) by mouth 2 (two) times daily with Kypton Eltringham meal.   Lancets Misc 1 each by Does not apply route 3 (three) times daily. Use as directed to check blood sugar. May dispense any manufacturer covered by patient's insurance and fits patient's device.   NovoLIN 70/30 (70-30) 100 UNIT/ML injection Generic drug: insulin NPH-regular Human Inject 40 Units into the skin 2 (two) times daily with Jataya Wann meal. What changed: when to take this       No Known Allergies  Follow-up Information     Diona Foley, MD. Schedule an appointment as soon as possible for Zymeir Salminen visit .   Specialty: Ophthalmology Contact  information: [address] [place] [address] [phone]         Eber Jones, MD Follow up.   Specialty: Ophthalmology Why: you have Jesselle Laflamme follow up appointment at Tuesday October 29th at 8:30 am Contact information: [address] [place] [address] [phone]                  The results of significant diagnostics from this hospitalization (including imaging, microbiology, ancillary and laboratory) are listed below for reference.    Significant Diagnostic Studies: ECHOCARDIOGRAM COMPLETE  Result Date: 06/17/2023    ECHOCARDIOGRAM REPORT   Patient Name:   Drew [name] Date of Exam: 06/17/2023 Medical Rec #:  [id number]  Height:       63.0 in Accession #:    [id number]        Weight:       132.3 lb Date of Birth:  1976-04-12        BSA:          1.622 m Patient Age:    46 years          BP:           156/92 mmHg Patient Gender: M                 HR:           87 bpm. Exam Location:  Inpatient Procedure: 2D Echo, Cardiac Doppler, Color Doppler and 3D Echo Indications:    NSTEMI I21.4  History:        Patient has no prior history of Echocardiogram examinations.                 Previous Myocardial Infarction; Risk Factors:Hypertension and                 Diabetes. Headaches.  Sonographer:    Lucendia Herrlich RCS Referring Phys: [phone] SUBRINA SUNDIL IMPRESSIONS  1. Left ventricular ejection fraction, by estimation, is 55 to 60%. The left ventricle has normal function. The left ventricle has no regional wall motion abnormalities. There is mild left ventricular hypertrophy. Left ventricular diastolic parameters were normal.  2. Right ventricular systolic function is normal. The right ventricular size is normal. There is normal pulmonary artery systolic pressure. The estimated right ventricular systolic pressure is 23.7 mmHg.  3. The mitral valve is normal in structure. Trivial mitral valve regurgitation. No evidence of mitral stenosis.  4. The aortic valve is  tricuspid. Aortic valve regurgitation is trivial. No aortic stenosis is present.  5. The inferior vena cava is dilated in size with >50% respiratory variability, suggesting right atrial pressure of 8 mmHg. FINDINGS  Left Ventricle: Left ventricular ejection fraction, by estimation, is 55 to 60%. The left ventricle has normal function. The left ventricle has no regional wall motion abnormalities. The left ventricular internal cavity size was normal in size. There is  mild left ventricular hypertrophy. Left ventricular diastolic parameters were normal. Right Ventricle: The right ventricular size is normal. No increase in right ventricular wall thickness. Right ventricular systolic function is normal. There is normal pulmonary artery systolic pressure. The tricuspid regurgitant velocity is 1.98 m/s, and  with an assumed right atrial pressure of 8 mmHg, the estimated right ventricular systolic pressure is 23.7 mmHg. Left Atrium: Left atrial size was normal in size. Right Atrium: Right atrial size was normal in size. Pericardium: There is no evidence of pericardial effusion. Mitral Valve: The mitral valve is normal in structure. Trivial mitral valve regurgitation. No evidence of mitral valve stenosis. Tricuspid Valve: The tricuspid valve is normal in structure. Tricuspid valve regurgitation is trivial. Aortic Valve: The aortic valve is tricuspid. Aortic valve regurgitation is trivial. No aortic stenosis is present. Aortic valve peak gradient measures 6.1 mmHg. Pulmonic Valve: The pulmonic valve was not well visualized. Pulmonic valve regurgitation is trivial. Aorta: The aortic root and ascending aorta are structurally normal, with no evidence of dilitation. Venous: The inferior vena cava is dilated in size with greater than 50% respiratory variability, suggesting right atrial pressure of 8 mmHg. IAS/Shunts: The interatrial septum was not well visualized.  LEFT VENTRICLE PLAX 2D LVIDd:         4.20 cm  Diastology LVIDs:          3.00 cm   LV e' medial:    8.39 cm/s LV PW:         1.10 cm   LV E/e' medial:  8.9 LV IVS:        1.00 cm   LV e' lateral:   10.30 cm/s LVOT diam:     2.20 cm   LV E/e' lateral: 7.2 LV SV:         59 LV SV Index:   36 LVOT Area:     3.80 cm                           3D Volume EF:                          3D EF:        54 %                          LV EDV:       140 ml                          LV ESV:       65 ml                          LV SV:        75 ml RIGHT VENTRICLE             IVC RV S prime:     15.50 cm/s  IVC diam: 2.20 cm TAPSE (M-mode): 1.9 cm LEFT ATRIUM             Index        RIGHT ATRIUM           Index LA diam:        3.50 cm 2.16 cm/m   RA Area:     11.90 cm LA Vol (A2C):   34.4 ml 21.21 ml/m  RA Volume:   22.90 ml  14.12 ml/m LA Vol (A4C):   54.8 ml 33.78 ml/m LA Biplane Vol: 46.0 ml 28.36 ml/m  AORTIC VALVE AV Area (Vmax): 2.41 cm AV Vmax:        123.00 cm/s AV Peak Grad:   6.1 mmHg LVOT Vmax:      78.00 cm/s LVOT Vmean:     52.300 cm/s LVOT VTI:       0.155 m  AORTA Ao Root diam: 3.30 cm Ao Asc diam:  3.40 cm MITRAL VALVE               TRICUSPID VALVE MV Area (PHT): 3.89 cm    TR Peak grad:   15.7 mmHg MV Decel Time: 195 msec    TR Vmax:        198.00 cm/s MV E velocity: 74.60 cm/s MV Anguel Delapena velocity: 74.10 cm/s  SHUNTS MV E/Anneka Studer ratio:  1.01        Systemic VTI:  0.16 m                            Systemic Diam: 2.20 cm Epifanio Lesches MD Electronically signed by Epifanio Lesches MD Signature Date/Time: 06/17/2023/4:58:22 PM    Final    CT Head Wo Contrast  Result Date: 06/17/2023 CLINICAL DATA:  Stroke suspected, eye problem, headache, hypertension EXAM: CT ANGIOGRAPHY HEAD AND NECK TECHNIQUE: Multidetector CT imaging of the head and neck was performed using the standard protocol during bolus administration of intravenous contrast. Multiplanar CT image reconstructions and MIPs were obtained to evaluate the vascular anatomy. Carotid stenosis measurements (when applicable)  are obtained utilizing NASCET criteria, using the distal internal carotid diameter as the denominator. RADIATION DOSE REDUCTION: This exam was performed according to the departmental dose-optimization program which includes automated exposure control, adjustment of the mA and/or kV according to patient size and/or use of iterative reconstruction technique. CONTRAST:  75mL OMNIPAQUE IOHEXOL 350 MG/ML SOLN COMPARISON:  09/01/2018 CT head, no prior CTA available FINDINGS: CT HEAD FINDINGS Brain: No evidence of acute infarct, hemorrhage, mass, mass effect, or midline shift. No hydrocephalus or extra-axial fluid collection. Vascular: No hyperdense vessel. Skull: Negative for fracture or focal lesion. Sinuses/Orbits: Mucosal thickening in the right maxillary sinus. No acute finding in the orbits. Other: The mastoid air cells are well aerated. CTA NECK FINDINGS Aortic arch: Standard branching. Imaged portion shows no evidence of aneurysm or dissection. No significant stenosis of the major arch vessel origins. Right carotid system: No evidence of stenosis, dissection, or occlusion. Left carotid system: No evidence of stenosis, dissection, or occlusion. Vertebral arteries: No evidence of stenosis, dissection, or occlusion. Skeleton: No acute osseous abnormality. Degenerative changes in the cervical spine. Other neck: No acute finding. Upper chest: No focal pulmonary opacity or pleural effusion. Review of the MIP images confirms the above findings CTA HEAD FINDINGS Anterior circulation: Both internal carotid arteries are patent to the termini, with moderate stenosis in the proximal right cavernous ICA (series 7, image 117), secondary to noncalcified plaque, with mild poststenotic dilatation. A1 segments patent. Normal anterior communicating artery. Anterior cerebral arteries are patent to their distal aspects without significant stenosis. No M1 stenosis or occlusion. MCA branches perfused to their distal aspects without  significant stenosis. Posterior circulation: Vertebral arteries patent to the vertebrobasilar junction with moderate stenosis secondary to noncalcified plaque in the proximal right V4 (series 7, image 148). The proximal right V4 is otherwise irregular. Posterior inferior cerebellar artery patent on the left. Possible common origin of the right AICA and PICA Basilar patent to its distal aspect without significant stenosis. Superior cerebellar arteries patent proximally. Patent P1 segments. PCAs perfused to their distal aspects without significant stenosis. Venous sinuses: As permitted by contrast timing, patent. Anatomic variants: None significant. Review of the MIP images confirms the above findings IMPRESSION: 1. No acute intracranial process. 2. No intracranial large vessel occlusion. 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 3. No hemodynamically significant stenosis in the neck. Electronically Signed   By: Wiliam Ke M.D.   On: 06/17/2023 00:15   CT ANGIO HEAD NECK W WO CM  Result Date: 06/17/2023 CLINICAL DATA:  Stroke suspected, eye problem, headache, hypertension EXAM: CT ANGIOGRAPHY HEAD AND NECK TECHNIQUE: Multidetector CT imaging of the head and neck was performed using the standard protocol during bolus administration of intravenous contrast. Multiplanar CT image reconstructions and MIPs were obtained to evaluate the vascular anatomy. Carotid stenosis measurements (when applicable) are obtained utilizing NASCET criteria, using the distal internal carotid diameter as the denominator. RADIATION DOSE REDUCTION: This exam was performed according to the departmental dose-optimization program which includes automated exposure control, adjustment of the mA and/or kV according to patient size and/or use of iterative reconstruction technique. CONTRAST:  75mL OMNIPAQUE IOHEXOL 350  MG/ML SOLN COMPARISON:  09/01/2018 CT head, no prior CTA available FINDINGS: CT HEAD FINDINGS  Brain: No evidence of acute infarct, hemorrhage, mass, mass effect, or midline shift. No hydrocephalus or extra-axial fluid collection. Vascular: No hyperdense vessel. Skull: Negative for fracture or focal lesion. Sinuses/Orbits: Mucosal thickening in the right maxillary sinus. No acute finding in the orbits. Other: The mastoid air cells are well aerated. CTA NECK FINDINGS Aortic arch: Standard branching. Imaged portion shows no evidence of aneurysm or dissection. No significant stenosis of the major arch vessel origins. Right carotid system: No evidence of stenosis, dissection, or occlusion. Left carotid system: No evidence of stenosis, dissection, or occlusion. Vertebral arteries: No evidence of stenosis, dissection, or occlusion. Skeleton: No acute osseous abnormality. Degenerative changes in the cervical spine. Other neck: No acute finding. Upper chest: No focal pulmonary opacity or pleural effusion. Review of the MIP images confirms the above findings CTA HEAD FINDINGS Anterior circulation: Both internal carotid arteries are patent to the termini, with moderate stenosis in the proximal right cavernous ICA (series 7, image 117), secondary to noncalcified plaque, with mild poststenotic dilatation. A1 segments patent. Normal anterior communicating artery. Anterior cerebral arteries are patent to their distal aspects without significant stenosis. No M1 stenosis or occlusion. MCA branches perfused to their distal aspects without significant stenosis. Posterior circulation: Vertebral arteries patent to the vertebrobasilar junction with moderate stenosis secondary to noncalcified plaque in the proximal right V4 (series 7, image 148). The proximal right V4 is otherwise irregular. Posterior inferior cerebellar artery patent on the left. Possible common origin of the right AICA and PICA Basilar patent to its distal aspect without significant stenosis. Superior cerebellar arteries patent proximally. Patent P1 segments.  PCAs perfused to their distal aspects without significant stenosis. Venous sinuses: As permitted by contrast timing, patent. Anatomic variants: None significant. Review of the MIP images confirms the above findings IMPRESSION: 1. No acute intracranial process. 2. No intracranial large vessel occlusion. 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 3. No hemodynamically significant stenosis in the neck. Electronically Signed   By: Wiliam Ke M.D.   On: 06/17/2023 00:15   DG Chest 2 View  Result Date: 06/16/2023 CLINICAL DATA:  CP EXAM: CHEST - 2 VIEW COMPARISON:  02/14/2014. FINDINGS: The heart size and mediastinal contours are within normal limits. Both lungs are clear. The visualized skeletal structures are unremarkable. IMPRESSION: No acute cardiopulmonary disease. Electronically Signed   By: Layla Maw M.D.   On: 06/16/2023 19:17    Microbiology: No results found for this or any previous visit (from the past 240 hour(s)).   Labs: Basic Metabolic Panel: Recent Labs  Lab 06/16/23 1727 06/16/23 1738 06/16/23 1851 06/17/23 0415 06/18/23 0724  NA 134* 134*  --  137 138  K 4.5 4.6  --  3.0* 3.3*  CL 95* 102  --  107 100  CO2 21*  --   --  24 25  GLUCOSE 499* 507*  --  198* 176*  BUN 14 18  --  12 11  CREATININE 1.07 1.00  --  0.66 0.68  CALCIUM 9.4  --   --  8.4* 8.6*  MG  --   --  2.2  --  1.8   Liver Function Tests: Recent Labs  Lab 06/16/23 1851 06/17/23 0415  AST 25 20  ALT 33 26  ALKPHOS 150* 105  BILITOT 0.8 1.0  PROT 7.7 6.5  ALBUMIN 3.4* 2.9*   Recent Labs  Lab  06/16/23 1851  LIPASE 43   No results for input(s): "AMMONIA" in the last 168 hours. CBC: Recent Labs  Lab 06/16/23 1727 06/16/23 1738 06/17/23 0415  WBC 6.1  --  7.5  HGB 15.2 14.6 13.9  HCT 43.3 43.0 37.9*  MCV 86.4  --  84.8  PLT 217  --  195   Cardiac Enzymes: No results for input(s): "CKTOTAL", "CKMB", "CKMBINDEX", "TROPONINI" in the last 168  hours. BNP: BNP (last 3 results) No results for input(s): "BNP" in the last 8760 hours.  ProBNP (last 3 results) No results for input(s): "PROBNP" in the last 8760 hours.  CBG: Recent Labs  Lab 06/17/23 0757 06/17/23 1259 06/17/23 1705 06/17/23 2201 06/18/23 0806  GLUCAP 264* 192* 203* 127* 168*       Signed:  Lacretia Nicks MD.  [facility] 06/18/2023, 10:50 AM

## 2023-06-18 NOTE — Evaluation (Signed)
Occupational Therapy Evaluation Patient Details Name: Drew [name] MRN: [id number] DOB: 05-23-1976 Today's Date: 06/18/2023   History of Present Illness 47 yo male admitted 9/16 with progressive vision loss, HTN emergency with demand ischemia related NSTEMI, hyperglycemia and HA. 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 Upon eval, Pt unable to see out of R eye (he reports for 2 months), and with significant visual loss in L eye more recently. Decreased L eye peripheral vision and with overall blurry vision more significant far away than up close. Due to visual deficits, pt observed with slowed movement, undershooting, needing cues to locate items, and max difficulty using phone during session. Accessibility features accessed by OT and font at max sizing on phone and pt reports greater usability. Pt needing cues to locate grooming items during session. Not observed to bump into items in hall, but observed to walk very close to items on R side. Discussed safety at work and potentially having wife come to [place] to stay with pt if possible. Recommending OP OT to optimize safety and independence in ADL and IADL       If plan is discharge home, recommend the following: A little help with walking and/or transfers;A little help with bathing/dressing/bathroom;Assistance with cooking/housework;Help with stairs or ramp for entrance;Assist for transportation (needs accommodation for medication label accessibility)    Functional Status Assessment  Patient has had a recent decline in their functional status and demonstrates the ability to make significant improvements in function in a reasonable and predictable amount of time.  Equipment Recommendations  BSC/3in1    Recommendations for Other  Services       Precautions / Restrictions Precautions Precautions: Other (comment) Precaution Comments: limited vision Restrictions Weight Bearing Restrictions: No      Mobility Bed Mobility Overal bed mobility: Independent                  Transfers Overall transfer level: Independent                        Balance Overall balance assessment: No apparent balance deficits (not formally assessed)                                         ADL either performed or assessed with clinical judgement   ADL Overall ADL's : Needs assistance/impaired Eating/Feeding: Set up;Sitting   Grooming: Oral care;Minimal assistance;Standing Grooming Details (indicate cue type and reason): MIn A for locating items and cues to apply small amount of toothpaste/decr visual acuity. Noted to undershoot needing increased time to feel location of toothpaste tueb on toothbrush (useing proprioception as compensatory strategy) Upper Body Bathing: Set up;Supervision/ safety;Sitting   Lower Body Bathing: Set up;Sit to/from stand;Sitting/lateral leans   Upper Body Dressing : Set up;Sitting   Lower Body Dressing: Set up;Sit to/from stand   Toilet Transfer: Radiographer, therapeutic Details (indicate cue type and reason): Supervision for safety; No physical assist needed but pt with slow, guarded movement due to fear of bumping into objects. Fair self-implementation of compensatory techniques Toileting- Clothing Manipulation and Hygiene: Set up       Functional mobility during ADLs: Supervision/safety General ADL  Comments: Supervision for safety; No physical assist needed but pt with slow, guarded movement due to fear of bumping into objects. Fair self-implementation of compensatory techniques     Vision Baseline Vision/History: 5 Retinopathy Ability to See in Adequate Light: 2 Moderately impaired Patient Visual Report: Eye fatigue/eye pain/headache;Other  (comment) (worsening visual symptoms; complete blindness of R eye for 2 months and visual flashes/decr L eye vision leading to admission) Vision Assessment?: Yes;Vision impaired- to be further tested in functional context Eye Alignment: Within Functional Limits Ocular Range of Motion:  (R eye does not fully converge) Tracking/Visual Pursuits: Impaired - to be further tested in functional context (Pt able to track on L with observed to lose track of stimulus in L upper quadrant but able to relocate given time. Needing to track in L eye nasal region on R due to R eye with no vision and thus not able to track R eye temporal region without head turn) Saccades: Undershoots Convergence: Impaired (comment) (R eye does not converge fully) Visual Fields:  (R eye blind; L eye Temporal periphery with decr vision) Depth Perception: Undershoots     Perception Perception:  (affected by low vision)       Praxis         Pertinent Vitals/Pain Pain Assessment Pain Assessment: No/denies pain     Extremity/Trunk Assessment Upper Extremity Assessment Upper Extremity Assessment: Overall WFL for tasks assessed   Lower Extremity Assessment Lower Extremity Assessment: Overall WFL for tasks assessed   Cervical / Trunk Assessment Cervical / Trunk Assessment: Normal   Communication Communication Communication: Other (comment) (Spanish speaking with interpreter Lacey Jensen)   Cognition Arousal: Alert Behavior During Therapy: WFL for tasks assessed/performed Overall Cognitive Status: Within Functional Limits for tasks assessed                                 General Comments: WFL for tasks assessed; RN expressed concerns about pt's DM managemnet and pt does admit to not being able to read labels since Monday of this week. Reached out to pharmacy to request large print Spanish language labels     General Comments  Requested large font education from RN/pharmacy/CM, spanish hanouts, and  potential medical transport to optimize pt safety. Pt reports wife may be able to come stay with him if she is able to get documentation to come to [place].    Exercises     Shoulder Instructions      Home Living Family/patient expects to be discharged to:: Private residence Living Arrangements: Alone   Type of Home: Apartment Home Access: Stairs to enter Entrance Stairs-Number of Steps: 14   Home Layout: One level     Bathroom Shower/Tub: Chief Strategy Officer: Standard     Home Equipment: None          Prior Functioning/Environment Prior Level of Function : Working/employed;Independent/Modified Independent             Mobility Comments: pt does not have AD and at times runs into walls due to impaired vision ADLs Comments: can see within a few feet of himself to manage items, 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, does not drive; uses taxi services        OT Problem List: Impaired vision/perception      OT Treatment/Interventions: Self-care/ADL training;Therapeutic exercise;DME and/or AE instruction;Visual/perceptual remediation/compensation;Patient/family education;Balance training;Therapeutic activities  OT Goals(Current goals can be found in the care plan section) Acute Rehab OT Goals Patient Stated Goal: get better OT Goal Formulation: With patient Time For Goal Achievement: 07/02/23 Potential to Achieve Goals: Good  OT Frequency: Min 1X/week    Co-evaluation              AM-PAC OT "6 Clicks" Daily Activity     Outcome Measure Help from another person eating meals?: A Little Help from another person taking care of personal grooming?: A Little Help from another person toileting, which includes using toliet, bedpan, or urinal?: A Little Help from another person bathing (including washing, rinsing, drying)?: A Little Help from another person to put on and taking off regular upper body clothing?: A  Little Help from another person to put on and taking off regular lower body clothing?: A Little 6 Click Score: 18   End of Session Nurse Communication: Mobility status  Activity Tolerance: Patient tolerated treatment well Patient left: in bed;with call bell/phone within reach  OT Visit Diagnosis: Low vision, both eyes (H54.2)                Time: 4098-1191 OT Time Calculation (min): 33 min Charges:  OT General Charges $OT Visit: 1 Visit OT Evaluation $OT Eval Low Complexity: 1 Low OT Treatments $Self Care/Home Management : 8-22 mins  Myrla Halsted, OTD, OTR/L [facility] Acute Rehabilitation Office: [phone]   Myrla Halsted 06/18/2023, 10:03 AM

## 2023-06-18 NOTE — TOC Initial Note (Signed)
Transition of Care [facility]) - Initial/Assessment Note    Patient Details  Name: Drew [name] MRN: [id number] Date of Birth: Apr 03, 1976  [facility]) CM/SW Contact:    Lawerance Sabal, RN Phone Number: 06/18/2023, 12:02 PM  Clinical Narrative:                  Had long conversation with patient at bedside and [employer] Interpreter.  CMA requested to make PCP appointment. It is on AVS.  [place] agreeable to a few HH RN visits under charity to ensure he has med management and understands discharge instructions and follow up appointments.  Meds getting filled through [facility] by [facility] pharmacy.  Patient has a friend at work who has helped him get to appointments and also has a friend who is an Biomedical scientist who helps with grocery shopping and transportation.     Expected Discharge Plan: Home w Home Health Services Barriers to Discharge: No Barriers Identified   Patient Goals and CMS Choice Patient states their goals for this hospitalization and ongoing recovery are:: to go home   Choice offered to / list presented to : NA      Expected Discharge Plan and Services   Discharge Planning Services: CM Consult Post Acute Care Choice: Home Health Living arrangements for the past 2 months: Apartment Expected Discharge Date: 06/18/23                         HH Arranged: RN HH Agency: [facility] Care Date [facility] Agency Contacted: 06/18/23 Time HH Agency Contacted: 1202 Representative spoke with at [facility] Agency: Kandee Keen  Prior Living Arrangements/Services Living arrangements for the past 2 months: Apartment Lives with:: Self                   Activities of Daily Living Home Assistive Devices/Equipment: Eyeglasses ADL Screening (condition at time of admission) Patient's cognitive ability adequate to safely complete daily activities?: Yes Is the patient deaf or have difficulty hearing?: No Does the patient have difficulty seeing, even when wearing glasses/contacts?:  Yes Does the patient have difficulty concentrating, remembering, or making decisions?: No Patient able to express need for assistance with ADLs?: Yes Does the patient have difficulty dressing or bathing?: No Independently performs ADLs?: Yes (appropriate for developmental age) Does the patient have difficulty walking or climbing stairs?: Yes Weakness of Legs: None Weakness of Arms/Hands: None  Permission Sought/Granted                  Emotional Assessment              Admission diagnosis:  Visual loss [H54.7] Hyperglycemia [R73.9] Hypertensive emergency [I16.1] Secondary hypertension [I15.9] Chest pain, unspecified type [R07.9] Hyperglycemic crisis due to diabetes mellitus (HCC) [E11.65] Patient Active Problem List   Diagnosis Date Noted   NSTEMI (non-ST elevated myocardial infarction) (HCC) 06/17/2023   Hypertensive urgency 06/17/2023   Hypertensive emergency 06/17/2023   Hyperglycemic crisis due to diabetes mellitus (HCC) 06/17/2023   Headache 06/17/2023   Diabetic retinopathy (HCC) 06/17/2023   Blindness of right eye 06/17/2023   Insulin dependent type 1 diabetes mellitus (HCC) 06/17/2023   AKI (acute kidney injury) (HCC) 06/17/2023   DKA (diabetic ketoacidoses) 02/14/2014   Nausea with vomiting 02/14/2014   PCP:  Patient, No Pcp Per Pharmacy:   [facility] [address], [place] - [address] [place] [address] Phone: [phone] Fax: [phone]  [facility] [address] [place] [address] Phone: [phone] Fax: [phone]     Social Determinants of Health (SDOH) Social History: SDOH Screenings   Food Insecurity: Food Insecurity Present (06/17/2023)  Housing: Low Risk  (06/17/2023)  Transportation Needs: No Transportation Needs (06/17/2023)  Utilities: Not At Risk (06/17/2023)  Tobacco Use: Low Risk  (06/17/2023)   SDOH Interventions:     Readmission Risk  Interventions     No data to display

## 2023-06-18 NOTE — Plan of Care (Signed)
Problem: Education: Goal: Ability to describe self-care measures that may prevent or decrease complications (Diabetes Survival Skills Education) will improve Outcome: Progressing Goal: Individualized Educational Video(s) Outcome: Progressing   Problem: Coping: Goal: Ability to adjust to condition or change in health will improve Outcome: Progressing   Problem: Fluid Volume: Goal: Ability to maintain a balanced intake and output will improve Outcome: Progressing   Problem: Health Behavior/Discharge Planning: Goal: Ability to identify and utilize available resources and services will improve Outcome: Progressing Goal: Ability to manage health-related needs will improve Outcome: Progressing   Problem: Metabolic: Goal: Ability to maintain appropriate glucose levels will improve Outcome: Progressing   Problem: Nutritional: Goal: Maintenance of adequate nutrition will improve Outcome: Progressing Goal: Progress toward achieving an optimal weight will improve Outcome: Progressing   Problem: Skin Integrity: Goal: Risk for impaired skin integrity will decrease Outcome: Progressing   Problem: Tissue Perfusion: Goal: Adequacy of tissue perfusion will improve Outcome: Progressing   Problem: Education: Goal: Knowledge of General Education information will improve Description: Including pain rating scale, medication(s)/side effects and non-pharmacologic comfort measures Outcome: Progressing   Problem: Health Behavior/Discharge Planning: Goal: Ability to manage health-related needs will improve Outcome: Progressing   Problem: Clinical Measurements: Goal: Ability to maintain clinical measurements within normal limits will improve Outcome: Progressing

## 2023-06-18 NOTE — Discharge Instructions (Signed)
Llame al mdico para: dificultad para respirar, dolor de cabeza o alteraciones visuales Llame al mdico para: fatiga extrema Llame al MD para: urticaria Llame al mdico para: mareos o aturdimiento persistentes Llame al mdico para: nuseas y vmitos persistentes Llame al mdico si: enrojecimiento, sensibilidad o signos de infeccin (dolor, hinchazn, enrojecimiento, olor o secrecin verde/amarilla alrededor del sitio de la incisin) Llame al mdico para: dolor intenso e incontrolado Llame al mdico para: temperatura >100,4 Dieta baja en sodio para la salud del corazn Instrucciones de alta Lo atendieron por problemas con su nivel de azcar en la sangre.  Esto ha mejorado despus de retomar sus medicamentos caseros para la diabetes.  Es importante que consulte con un proveedor de atencin primaria para pacientes ambulatorios para ayudarlo a Chief Operating Officer su diabetes como paciente ambulatorio.  Debido a su visin, la pluma de insulina puede ser ms fcil de usar en el futuro.  Puede recogerlo la prxima vez que necesite una recarga en [place].  Tena dolor en el pecho y fue atendido por cardiologa.  La ecografa de su corazn (ecocardiograma) fue en general tranquilizadora.  Le hemos iniciado el tratamiento con Lipitor (un medicamento para reducir Print production planner) debido a su riesgo cardiovascular general, adems de aspirina (evite medicamentos como los [place] (ibuprofeno, naproxeno, [place], etc.) o polvos dulces mientras toma aspirina).  Cardiologa le organizar una prueba de esfuerzo ambulatoria.  Debe realizar seguimiento con cardiologa de forma ambulatoria.  Tiene retinopata diabtica proliferativa.  Necesitar un seguimiento continuo con su oftalmlogo como paciente ambulatorio.  Tena un estrechamiento de la arteria cartida derecha que debe controlarse de forma ambulatoria.  Regrese por problemas nuevos, recurrentes o que empeoran.  Pdale a su PCP que solicite registros de esta hospitalizacin  para que sepa qu se hizo y cules sern los prximos pasos

## 2023-06-18 NOTE — Inpatient Diabetes Management (Addendum)
Inpatient Diabetes Program Recommendations  AACE/ADA: New Consensus Statement on Inpatient Glycemic Control (2015)  Target Ranges:  Prepandial:   less than 140 mg/dL      Peak postprandial:   less than 180 mg/dL (1-2 hours)      Critically ill patients:  140 - 180 mg/dL   Lab Results  Component Value Date   GLUCAP 263 (H) 06/18/2023   HGBA1C 9.9 (H) 06/17/2023    Review of Glycemic Control  Diabetes history: type 2 Outpatient Diabetes medications: none Current orders for Inpatient glycemic control: Novolog 70/30 insulin 40 units BID  Inpatient Diabetes Program Recommendations:    Discharge Recommendations: Other recommendations: TOC prharmacy has Novolin 70/30 insulin vials, patient requests 0.5 ml insulin syringes (order # [id number]) Intermediate acting recommendations: insulin NPH-Regular (NOVOLIN 70/30 RELION) injection ([facility] Only) 40 units BID (8am and 9:30 pm)  Supply/Referral recommendations: Glucometer Test strips Lancet device Lancets  The TOC pharmacy has the Novolin 70/30 insulin in a vial. And they do have the 1/2 cc insulin syringes.   Spoke with patient with interpreter, Raquel, at the bedside. Reviewed the insulin pen, but patient was not able to see the numbers. He prefers the vial and syringe.  Patient does well with insulin syringe. States that he needs the 0.5 ml insulin syringe in order to see his dosage. He did well with the 40 units of insulin for practice.   Smith Mince RN BSN CDE Diabetes Coordinator Pager: [phone]  8am-5pm   Use Adult Diabetes Insulin Treatment Post Discharge order set.

## 2023-06-19 ENCOUNTER — Telehealth (HOSPITAL_COMMUNITY): Payer: Self-pay | Admitting: Emergency Medicine

## 2023-06-19 ENCOUNTER — Other Ambulatory Visit (HOSPITAL_COMMUNITY): Payer: Self-pay | Admitting: Internal Medicine

## 2023-06-19 DIAGNOSIS — R079 Chest pain, unspecified: Secondary | ICD-10-CM

## 2023-06-19 DIAGNOSIS — R0789 Other chest pain: Secondary | ICD-10-CM

## 2023-06-19 NOTE — Telephone Encounter (Signed)
  Shared Decision Making/Informed ConsentThe risks [chest pain, shortness of breath, cardiac arrhythmias, dizziness, blood pressure fluctuations, myocardial infarction, stroke/transient ischemic attack, nausea, vomiting, allergic reaction, radiation exposure, metallic taste sensation and life-threatening complications (estimated to be 1 in 10,000)], benefits (risk stratification, diagnosing coronary artery disease, treatment guidance) and alternatives of a cardiac PET stress test were discussed in detail with Drew [name] and he agrees to proceed.

## 2023-06-25 ENCOUNTER — Telehealth: Payer: Self-pay | Admitting: Critical Care Medicine

## 2023-06-27 ENCOUNTER — Ambulatory Visit: Payer: Self-pay | Admitting: Physician Assistant

## 2023-07-09 ENCOUNTER — Other Ambulatory Visit (HOSPITAL_COMMUNITY): Payer: Self-pay

## 2023-07-11 ENCOUNTER — Other Ambulatory Visit: Payer: Self-pay

## 2023-07-14 ENCOUNTER — Encounter: Payer: Self-pay | Admitting: Internal Medicine

## 2023-07-14 ENCOUNTER — Other Ambulatory Visit (HOSPITAL_COMMUNITY): Payer: Self-pay

## 2023-07-14 ENCOUNTER — Other Ambulatory Visit: Payer: Self-pay

## 2023-07-14 ENCOUNTER — Ambulatory Visit: Payer: Self-pay | Attending: Internal Medicine | Admitting: Internal Medicine

## 2023-07-14 VITALS — BP 161/81 | HR 104 | Temp 98.3°F | Ht 62.0 in | Wt 159.0 lb

## 2023-07-14 DIAGNOSIS — I10 Essential (primary) hypertension: Secondary | ICD-10-CM | POA: Insufficient documentation

## 2023-07-14 DIAGNOSIS — Z7689 Persons encountering health services in other specified circumstances: Secondary | ICD-10-CM

## 2023-07-14 DIAGNOSIS — E1069 Type 1 diabetes mellitus with other specified complication: Secondary | ICD-10-CM

## 2023-07-14 DIAGNOSIS — E785 Hyperlipidemia, unspecified: Secondary | ICD-10-CM | POA: Insufficient documentation

## 2023-07-14 DIAGNOSIS — E10319 Type 1 diabetes mellitus with unspecified diabetic retinopathy without macular edema: Secondary | ICD-10-CM

## 2023-07-14 DIAGNOSIS — Z23 Encounter for immunization: Secondary | ICD-10-CM

## 2023-07-14 DIAGNOSIS — E1059 Type 1 diabetes mellitus with other circulatory complications: Secondary | ICD-10-CM

## 2023-07-14 DIAGNOSIS — I152 Hypertension secondary to endocrine disorders: Secondary | ICD-10-CM

## 2023-07-14 MED ORDER — AMLODIPINE BESYLATE 5 MG PO TABS
5.0000 mg | ORAL_TABLET | Freq: Every day | ORAL | 1 refills | Status: DC
  Filled 2023-07-14: qty 90, 90d supply, fill #0
  Filled 2023-10-09 – 2023-10-27 (×2): qty 90, 90d supply, fill #1

## 2023-07-14 MED ORDER — CARVEDILOL 3.125 MG PO TABS
3.1250 mg | ORAL_TABLET | Freq: Two times a day (BID) | ORAL | 2 refills | Status: DC
  Filled 2023-07-14: qty 180, 90d supply, fill #0
  Filled 2023-10-09 – 2023-10-27 (×2): qty 180, 90d supply, fill #1
  Filled 2024-01-19: qty 180, 90d supply, fill #2

## 2023-07-14 MED ORDER — ATORVASTATIN CALCIUM 20 MG PO TABS
20.0000 mg | ORAL_TABLET | Freq: Every day | ORAL | 1 refills | Status: DC
  Filled 2023-07-14: qty 90, 90d supply, fill #0
  Filled 2023-10-09 – 2023-10-27 (×2): qty 90, 90d supply, fill #1

## 2023-07-14 MED ORDER — ASPIRIN 81 MG PO CHEW
81.0000 mg | CHEWABLE_TABLET | Freq: Every day | ORAL | 0 refills | Status: AC
  Filled 2023-07-14: qty 90, 90d supply, fill #0

## 2023-07-14 MED ORDER — NOVOLIN 70/30 (70-30) 100 UNIT/ML ~~LOC~~ SUSP
42.0000 [IU] | Freq: Two times a day (BID) | SUBCUTANEOUS | 11 refills | Status: DC
  Filled 2023-07-14: qty 10, 12d supply, fill #0
  Filled 2023-09-16: qty 30, 36d supply, fill #1

## 2023-07-14 NOTE — Progress Notes (Signed)
Patient ID: Drew [name], male    DOB: [birth year]  MRN: [id number]  CC: Hospitalization Follow-up (Hospitalization f/u. Med refills. [place] [facility] of vision concern/)   Subjective: Drew [name] is a 47 y.o. male who presents for chronic ds management. His concerns today include:  Patient with history of diabetes type 1 with retinopathy, blind in the right eye, HTN, CAS RT, HL, NSTEMI   AMN Language interpreter used during this encounter. #Francisco [id number]   Presents for new pt visit and hosp f/u.  Did not bring meds with him.  Patient hospitalized 9/16-18/2024 with chief complaint of headache chest pain and vision loss.  Found to have hypertensive emergency.  TTE showed EF of 55 to 60% with no RWMA, mild LVH.  Patient placed on amlodipine 5 mg daily and carvedilol 3.125 mg twice a day.  Also started on aspirin and statin.  For his diabetes, his A1c was 9.1.  He was resumed on NPH/regular insulin 40 units subcu twice a day.  Patient with known proliferative diabetic retinopathy.  He states that he has a follow-up with ophthalmology.  He was seen by Dr. Dahlia Client on 06/16/2023 and has a follow-up appointment with Dr. Sherryll Burger later this month. Found to have elevated troponin level that remained flat.  EKG with no ST elevation/depression or T wave inversions.  Changes thought to be due to hypertensive emergency.  He has a follow-up appointment coming up with cardiology as outpatient.  CAT scan of the head was negative for any acute findings.  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  Today: Patient did not bring his medicines with him today.  He tells me that he is out of 2 medications that he thinks may be for blood pressure.  Denies any headaches.  No chest pains or shortness of breath.  In regards to his diabetes he is taking NPH/regular insulin 40 units twice a day.  Checks blood sugars in the mornings before breakfast.  Gives range of 150-160.  Also checks blood  pressure in the evenings at bedtime and gives range of 180-190.  He is concerned about his vision loss in the right eye.  States that vision in the left eye is also getting worse.  Patient Active Problem List   Diagnosis Date Noted   NSTEMI (non-ST elevated myocardial infarction) (HCC) 06/17/2023   Hypertensive urgency 06/17/2023   Hypertensive emergency 06/17/2023   Hyperglycemic crisis due to diabetes mellitus (HCC) 06/17/2023   Headache 06/17/2023   Diabetic retinopathy (HCC) 06/17/2023   Blindness of right eye 06/17/2023   Insulin dependent type 1 diabetes mellitus (HCC) 06/17/2023   AKI (acute kidney injury) (HCC) 06/17/2023   DKA (diabetic ketoacidosis) (HCC) 02/14/2014   Nausea with vomiting 02/14/2014     Current Outpatient Medications on File Prior to Visit  Medication Sig Dispense Refill   Accu-Chek Softclix Lancets lancets Use 1 each 3 (three) times daily as directed to check blood sugar 100 each 0   Blood Glucose Monitoring Suppl (BLOOD GLUCOSE MONITOR SYSTEM) w/Device KIT Use 3 (three) times daily. 1 kit 0   Glucose Blood (BLOOD GLUCOSE TEST STRIPS) STRP Use 3 (three) times daily as directed to check blood sugar. 100 strip 0   Insulin Syringe-Needle U-100 (ULTICARE INSULIN SYRINGE) 30G X 1/2" 0.5 ML MISC Use as directed with insulin 100 each 0   No current facility-administered medications on file prior to visit.    No Known Allergies  Social History   Socioeconomic History  Marital status: Single    Spouse name: Not on file   Number of children: Not on file   Years of education: Not on file   Highest education level: Not on file  Occupational History   Not on file  Tobacco Use   Smoking status: Never   Smokeless tobacco: Never  Vaping Use   Vaping status: Never Used  Substance and Sexual Activity   Alcohol use: No   Drug use: No   Sexual activity: Not on file  Other Topics Concern   Not on file  Social History Narrative   Not on file   Social  Determinants of Health   Financial Resource Strain: Not on file  Food Insecurity: Food Insecurity Present (06/17/2023)   Hunger Vital Sign    Worried About Running Out of Food in the Last Year: Sometimes true    Ran Out of Food in the Last Year: Sometimes true  Transportation Needs: No Transportation Needs (06/17/2023)   PRAPARE - Administrator, Civil Service (Medical): No    Lack of Transportation (Non-Medical): No  Physical Activity: Not on file  Stress: Not on file  Social Connections: Not on file  Intimate Partner Violence: Not At Risk (06/17/2023)   Humiliation, Afraid, Rape, and Kick questionnaire    Fear of Current or Ex-Partner: No    Emotionally Abused: No    Physically Abused: No    Sexually Abused: No    History reviewed. No pertinent family history.  History reviewed. No pertinent surgical history.  ROS: Review of Systems Negative except as stated above  PHYSICAL EXAM: BP (!) 161/81 (BP Location: Left Arm, Patient Position: Sitting, Cuff Size: Normal)   Pulse (!) 104   Temp 98.3 F (36.8 C) (Oral)   Ht 5\' 2"  (1.575 m)   Wt 159 lb (72.1 kg)   SpO2 98%   BMI 29.08 kg/m   Physical Exam   General appearance - alert, well appearing, middle-aged Hispanic male and in no distress Mental status - normal mood, behavior, speech, dress, motor activity, and thought processes Neck - supple, no significant adenopathy Chest - clear to auscultation, no wheezes, rales or rhonchi, symmetric air entry Heart - normal rate, regular rhythm, normal S1, S2, no murmurs, rubs, clicks or gallops Extremities - peripheral pulses normal, no pedal edema, no clubbing or cyanosis     Latest Ref Rng & Units 06/18/2023    7:24 AM 06/17/2023    4:15 AM 06/16/2023    6:51 PM  CMP  Glucose 70 - 99 mg/dL 355  732    BUN 6 - 20 mg/dL 11  12    Creatinine 2.02 - 1.24 mg/dL 5.42  7.06    Sodium 237 - 145 mmol/L 138  137    Potassium 3.5 - 5.1 mmol/L 3.3  3.0    Chloride 98 - 111  mmol/L 100  107    CO2 22 - 32 mmol/L 25  24    Calcium 8.9 - 10.3 mg/dL 8.6  8.4    Total Protein 6.5 - 8.1 g/dL  6.5  7.7   Total Bilirubin 0.3 - 1.2 mg/dL  1.0  0.8   Alkaline Phos 38 - 126 U/L  105  150   AST 15 - 41 U/L  20  25   ALT 0 - 44 U/L  26  33    Lipid Panel     Component Value Date/Time   CHOL 156 06/17/2023 0415   TRIG  181 (H) 06/17/2023 0415   HDL 43 06/17/2023 0415   CHOLHDL 3.6 06/17/2023 0415   VLDL 36 06/17/2023 0415   LDLCALC 77 06/17/2023 0415    CBC    Component Value Date/Time   WBC 7.5 06/17/2023 0415   RBC 4.47 06/17/2023 0415   HGB 13.9 06/17/2023 0415   HCT 37.9 (L) 06/17/2023 0415   PLT 195 06/17/2023 0415   MCV 84.8 06/17/2023 0415   MCH 31.1 06/17/2023 0415   MCHC 36.7 (H) 06/17/2023 0415   RDW 12.2 06/17/2023 0415   LYMPHSABS 2.1 07/31/2018 1904   MONOABS 0.5 07/31/2018 1904   EOSABS 0.1 07/31/2018 1904   BASOSABS 0.0 07/31/2018 1904   Lab Results  Component Value Date   HGBA1C 9.9 (H) 06/17/2023     ASSESSMENT AND PLAN: 1. Establishing care with new doctor, encounter for   2. 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  Fasting morning blood sugar readings close to but not at goal.  I recommend increasing Novolin 70/30 to 42 units twice a day.  We will check antibodies to confirm that he is type I and not a type II diabetic.  Continue to monitor blood sugars.  Bring readings in with him to see the clinical pharmacist in about 3 weeks. Healthy eating habits encouraged. -Keep upcoming appointment with ophthalmology. - Microalbumin / creatinine urine ratio - insulin NPH-regular Human (NOVOLIN 70/30) (70-30) 100 UNIT/ML injection; Inject 42 Units into the skin 2 (two) times daily with a meal.  Dispense: 10 mL; Refill: 11 - IA-2 Autoantibodies - Glutamic acid decarboxylase auto abs - Basic Metabolic Panel  3.  Hypertension associated with type 1 diabetes mellitus (HCC) Not at goal.  Suspect he may be out of amlodipine and carvedilol.  Refills sent to the pharmacy today. - carvedilol (COREG) 3.125 MG tablet; Take 1 tablet (3.125 mg total) by mouth 2 (two) times daily with a meal.  Dispense: 180 tablet; Refill: 2 - amLODipine (NORVASC) 5 MG tablet; Take 1 tablet (5 mg total) by mouth daily.  Dispense: 90 tablet; Refill: 1  4. Hyperlipidemia due to type 1 diabetes mellitus (HCC) Continue atorvastatin. - atorvastatin (LIPITOR) 20 MG tablet; Take 1 tablet (20 mg total) by mouth daily.  Dispense: 90 tablet; Refill: 1  5. Need for influenza vaccination Recommend that he gets the flu vaccine from the pharmacy downstairs on the first floor.    Patient was given the opportunity to ask questions.  Patient verbalized understanding of the plan and was able to repeat key elements of the plan.   This documentation was completed using Paediatric nurse.  Any transcriptional errors are unintentional.  Orders Placed This Encounter  Procedures   Microalbumin / creatinine urine ratio   IA-2 Autoantibodies   Glutamic acid decarboxylase auto abs   Basic Metabolic Panel     Requested Prescriptions   Signed Prescriptions Disp Refills   carvedilol (COREG) 3.125 MG tablet 180 tablet 2    Sig: Take 1 tablet (3.125 mg total) by mouth 2 (two) times daily with a meal.   atorvastatin (LIPITOR) 20 MG tablet 90 tablet 1    Sig: Take 1 tablet (20 mg total) by mouth daily.   aspirin 81 MG chewable tablet 90 tablet 0    Sig: Chew 1 tablet (81 mg total) by mouth daily.   amLODipine (NORVASC) 5 MG tablet 90 tablet 1  Sig: Take 1 tablet (5 mg total) by mouth daily.   insulin NPH-regular Human (NOVOLIN 70/30) (70-30) 100 UNIT/ML injection 10 mL 11    Sig: Inject 42 Units into the skin 2 (two) times daily with a meal.    Return in about 2 months (around 09/13/2023) for Appt with [facility] in 3 wks for BS  check.  Jonah Blue, MD, FACP

## 2023-07-15 ENCOUNTER — Other Ambulatory Visit (HOSPITAL_COMMUNITY): Payer: Self-pay

## 2023-07-15 ENCOUNTER — Other Ambulatory Visit: Payer: Self-pay

## 2023-07-16 ENCOUNTER — Other Ambulatory Visit: Payer: Self-pay | Admitting: Internal Medicine

## 2023-07-16 ENCOUNTER — Other Ambulatory Visit: Payer: Self-pay

## 2023-07-16 MED ORDER — LISINOPRIL 2.5 MG PO TABS
2.5000 mg | ORAL_TABLET | Freq: Every day | ORAL | 0 refills | Status: DC
  Filled 2023-07-16: qty 90, 90d supply, fill #0

## 2023-07-18 ENCOUNTER — Telehealth: Payer: Self-pay

## 2023-07-18 NOTE — Telephone Encounter (Signed)
Patient is requesting for his lisinopril medication to be sent via delivery due transportation and vision issues. Please send medication to [facility] with a comment stating "patient is a [facility] patient. Prefers delivery." Confirmed with patient that address in chart is correct.

## 2023-07-19 ENCOUNTER — Other Ambulatory Visit (HOSPITAL_COMMUNITY): Payer: Self-pay

## 2023-07-19 MED ORDER — LISINOPRIL 2.5 MG PO TABS
2.5000 mg | ORAL_TABLET | Freq: Every day | ORAL | 1 refills | Status: DC
  Filled 2023-07-19 – 2023-10-27 (×2): qty 90, 90d supply, fill #0
  Filled 2024-01-19: qty 90, 90d supply, fill #1

## 2023-07-21 ENCOUNTER — Other Ambulatory Visit: Payer: Self-pay

## 2023-07-27 ENCOUNTER — Encounter: Payer: Self-pay | Admitting: Internal Medicine

## 2023-07-27 LAB — MICROALBUMIN / CREATININE URINE RATIO
Creatinine, Urine: 107.4 mg/dL
Microalb/Creat Ratio: 882 mg/g{creat} — ABNORMAL HIGH (ref 0–29)
Microalbumin, Urine: 947.4 ug/mL

## 2023-07-27 LAB — BASIC METABOLIC PANEL
BUN/Creatinine Ratio: 22 — ABNORMAL HIGH (ref 9–20)
BUN: 20 mg/dL (ref 6–24)
CO2: 24 mmol/L (ref 20–29)
Calcium: 9.4 mg/dL (ref 8.7–10.2)
Chloride: 101 mmol/L (ref 96–106)
Creatinine, Ser: 0.9 mg/dL (ref 0.76–1.27)
Glucose: 120 mg/dL — ABNORMAL HIGH (ref 70–99)
Potassium: 4 mmol/L (ref 3.5–5.2)
Sodium: 139 mmol/L (ref 134–144)
eGFR: 106 mL/min/{1.73_m2} (ref >59)

## 2023-07-27 LAB — GLUTAMIC ACID DECARBOXYLASE AUTO ABS: Glutamic Acid Decarb Ab: <5 U/mL (ref 0.0–5.0)

## 2023-07-27 LAB — IA-2 AUTOANTIBODIES: IA-2 Autoantibodies: <7.5 U/mL

## 2023-07-27 NOTE — Progress Notes (Signed)
GAD antibodies and Islet cell antibodies are negative indicating pt has type 2 diabetes.  He has positive macroalbumin with level of 882.  Started on Lisinopril.

## 2023-08-08 ENCOUNTER — Ambulatory Visit: Payer: Self-pay | Admitting: Pharmacist

## 2023-08-08 ENCOUNTER — Encounter: Payer: Self-pay | Admitting: Physician Assistant

## 2023-08-08 ENCOUNTER — Ambulatory Visit: Payer: Self-pay | Attending: Physician Assistant | Admitting: Physician Assistant

## 2023-08-08 VITALS — BP 129/72 | HR 88 | Ht 64.96 in | Wt 151.0 lb

## 2023-08-08 DIAGNOSIS — R7989 Other specified abnormal findings of blood chemistry: Secondary | ICD-10-CM

## 2023-08-08 DIAGNOSIS — I10 Essential (primary) hypertension: Secondary | ICD-10-CM

## 2023-08-08 DIAGNOSIS — I214 Non-ST elevation (NSTEMI) myocardial infarction: Secondary | ICD-10-CM

## 2023-08-08 DIAGNOSIS — R072 Precordial pain: Secondary | ICD-10-CM

## 2023-08-08 NOTE — Progress Notes (Unsigned)
  Cardiology Office Note:  .   Date:  08/08/2023  ID:  Drew [name], DOB Mar 30, 1976, MRN [id number] PCP: Patient, No Pcp Per  [facility] Providers Cardiologist:  None { Click to update primary MD,subspecialty MD or APP then REFRESH:1}   History of Present Illness: .   Drew [name] is a 47 y.o. male with PMH of hypertension, DM2 was proliferative diabetic retinopathy who was recently admitted to the hospital in September 2024 with chest pain.  He had 2 months course of vision changes.  He had a vision loss in both eyes.  Ophthalmology service recommended conservative management.  On arrival to [facility], serial troponin was 48--> 54.  Blood pressure on arrival was 208/102.  It was suspected his symptom was more related to uncontrolled blood pressure.  Echocardiogram was obtained on 06/17/2023 showed EF 55 to 60%, mild LVH, no regional wall motion abnormality, RVSP 23.7 mmHg, trivial MR, trivial AI.  He was placed on amlodipine, carvedilol, aspirin and statin therapy.  The plan is for outpatient stress test.  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  Since discharge, he has been seen by his PCP on 07/14/2023, he is microalbumin/creatinine ratio was elevated at 882. Lisinopril 2.5 mg daily added.   Patient presents today for follow-up.  His blood pressure is very well-controlled.  He continued to have neck pain that tend to occur when he looked down.  I suspect he has a cervical spine issue.  He will occasionally have chest discomfort.  We recommend a PET stress test.  He has family history of early heart issue involve his mother.  He requested some help with transportation to his doctor's appointment.  ROS: ***  Studies Reviewed: .        *** Risk Assessment/Calculations:   {Does this patient have ATRIAL FIBRILLATION?:[phone]}         Physical Exam:   VS:  BP  129/72 (BP Location: Left Arm, Patient Position: Sitting, Cuff Size: Normal)   Pulse 88   Ht 5' 4.96" (1.65 m)   Wt 151 lb (68.5 kg)   SpO2 98%   BMI 25.16 kg/m    Wt Readings from Last 3 Encounters:  08/08/23 151 lb (68.5 kg)  07/14/23 159 lb (72.1 kg)  06/18/23 155 lb 6.8 oz (70.5 kg)    GEN: Well nourished, well developed in no acute distress NECK: No JVD; No carotid bruits CARDIAC: ***RRR, no murmurs, rubs, gallops RESPIRATORY:  Clear to auscultation without rales, wheezing or rhonchi  ABDOMEN: Soft, non-tender, non-distended EXTREMITIES:  No edema; No deformity   ASSESSMENT AND PLAN: .   ***    {Are you ordering a CV Procedure (e.g. stress test, cath, DCCV, TEE, etc)?   Press F2        :[id number]}  Dispo: ***  Signed, Azalee Course, PA

## 2023-08-08 NOTE — Patient Instructions (Signed)
Medication Instructions:  NO CHANGES *If you need a refill on your cardiac medications before your next appointment, please call your pharmacy*   Lab Work: NO LABS If you have labs (blood work) drawn today and your tests are completely normal, you will receive your results only by: MyChart Message (if you have MyChart) OR A paper copy in the mail If you have any lab test that is abnormal or we need to change your treatment, we will call you to review the results.   Testing/Procedures:  How to Prepare for Your Cardiac PET/CT Stress Test:  1. Please do not take these medications before your test:   Medications that may interfere with the cardiac pharmacological stress agent (ex. nitrates - including erectile dysfunction medications, isosorbide mononitrate- [please start to hold this medication the day before the test], tamulosin or beta-blockers) the day of the exam. (Erectile dysfunction medication should be held for at least 72 hrs prior to test) Theophylline containing medications for 12 hours. Dipyridamole 48 hours prior to the test. Your remaining medications may be taken with water.  2. Nothing to eat or drink, except water, 3 hours prior to arrival time.   NO caffeine/decaffeinated products, or chocolate 12 hours prior to arrival.  3. NO perfume, cologne or lotion on chest or abdomen area.          4. Total time is 1 to 2 hours; you may want to bring reading material for the waiting time.  5. Please report to Radiology at the [facility] Main Entrance 30 minutes early for your test.  [address] [place], [place] [address]  Diabetic Preparation:  Hold oral medications. You may take NPH and Lantus insulin. Do not take Humalog or Humulin R (Regular Insulin) the day of your test. Check blood sugars prior to leaving the house. If able to eat breakfast prior to 3 hour fasting, you may take all medications, including your insulin, Do not worry if you miss your  breakfast dose of insulin - start at your next meal. Patients who wear a continuous glucose monitor MUST remove the device prior to scanning.  In preparation for your appointment, medication and supplies will be purchased.  Appointment availability is limited, so if you need to cancel or reschedule, please call the Radiology Department at [phone] [facility]) 24 hours in advance to avoid a cancellation fee of $100.00  What to Expect After you Arrive:  Once you arrive and check in for your appointment, you will be taken to a preparation room within the Radiology Department.  A technologist or Nurse will obtain your medical history, verify that you are correctly prepped for the exam, and explain the procedure.  Afterwards,  an IV will be started in your arm and electrodes will be placed on your skin for EKG monitoring during the stress portion of the exam. Then you will be escorted to the PET/CT scanner.  There, staff will get you positioned on the scanner and obtain a blood pressure and EKG.  During the exam, you will continue to be connected to the EKG and blood pressure machines.  A small, safe amount of a radioactive tracer will be injected in your IV to obtain a series of pictures of your heart along with an injection of a stress agent.    After your Exam:  It is recommended that you eat a meal and drink a caffeinated beverage to counter act any effects of the stress agent.  Drink plenty of  fluids for the remainder of the day and urinate frequently for the first couple of hours after the exam.  Your doctor will inform you of your test results within 7-10 business days.  For more information and frequently asked questions, please visit our website : [email or web address]  For questions about your test or how to prepare for your test, please call: Cardiac Imaging Nurse Navigators Office: [phone]    Follow-Up: At [facility], you and your health needs are our  priority.  As part of our continuing mission to provide you with exceptional heart care, we have created designated Provider Care Teams.  These Care Teams include your primary Cardiologist (physician) and Advanced Practice Providers (APPs -  Physician Assistants and Nurse Practitioners) who all work together to provide you with the care you need, when you need it.  We recommend signing up for the patient portal called "MyChart".  Sign up information is provided on this After Visit Summary.  MyChart is used to connect with patients for Virtual Visits (Telemedicine).  Patients are able to view lab/test results, encounter notes, upcoming appointments, etc.  Non-urgent messages can be sent to your provider as well.   To learn more about what you can do with MyChart, go to [email or web address].    Your next appointment:   3-4 month(s)  Provider:   Weston Brass, MD

## 2023-08-11 ENCOUNTER — Telehealth: Payer: Self-pay | Admitting: Licensed Clinical Social Worker

## 2023-08-13 NOTE — Progress Notes (Signed)
Heart and Vascular Care Navigation  08/11/2023  Drew [name] 01/16/1976 [id number]  Reason for Referral: uninsured, transportation issues Patient is participating in a Managed Medicaid Plan: No, self pay only  Engaged with patient by telephone for initial visit for Heart and Vascular Care Coordination.                                                                                                   Assessment:               LCSW initially spoke with pt this morning with assistance of Spanish language interpreter. He requested that he be called back at 3-330 pm this afternoon. I was able to speak with pt with the assistance of Spanish language interpreter again this afternoon. He confirmed home address, currently employed part time and uninsured. He denies any issues with housing/utilities but does share food challenges. He shares he does not have rides to appts/to get things for daily living but has a brother locally who assists as well as friends. Even though he works pt states he is so visually impaired he cannot read instructions given by office even if in Spanish and is unable to see the numbers on his phone to check his voicemail. He is unsure if anyone from [employer] has called him to screen for Emergency Medicaid, encouraged him to ask his brother to help him check his voicemails as there also may be additional messages to schedule appts/from referrals made by our office and to another ophthalmologist for a third opinion. He will try and do so. We discussed Emergency Medicaid may help with his previous hospital stay but for ongoing assistance he will need to apply for [employer] Financial Assistance and [employer]. Drew [name], Artist can assist as able. I will send applications and f/u to ask pt to bring to financial counselor office when he goes for upcoming PCP appts. Pt PCP office can provide transportation to their appts and we can to our appts at Heart/Vascular and for testing  ordered. Pt states understanding. Will f/u to answer any additional questions and provide additional assistance.                    HRT/VAS Care Coordination     Patients Home Cardiology Office [facility]   Outpatient Care Team Social Worker   Living arrangements for the past 2 months Single Family Home   Lives with: Roommate   Patient Current Insurance Coverage Self-Pay   Patient Has Concern With Paying Medical Bills Yes   Patient Concerns With Medical Bills uninsured, visually impaired, ongoing medical management needed   Medical Bill Referrals: [employer], [employer], referral to [employer] for Emergency Medicaid   Does Patient Have Prescription Coverage? No   Home Assistive Devices/Equipment Eyeglasses   [facility] Care       Social History:  SDOH Screenings   Food Insecurity: Food Insecurity Present (08/13/2023)  Housing: Medium Risk (08/13/2023)  Transportation Needs: Unmet Transportation Needs (08/13/2023)  Utilities: Not At Risk (08/13/2023)  Depression (PHQ2-9): Low Risk  (07/14/2023)  Financial Resource Strain: High Risk (08/13/2023)  Tobacco Use: Low Risk  (08/08/2023)  Health Literacy: Inadequate Health Literacy (08/13/2023)    SDOH Interventions: Financial Resources:  Financial Strain Interventions: Other (Comment) (limited income, limited assistance available due to documentation status, will send community resources for utility/rent assistance and food.) DSS for financial assistance and Financial Counseling for [employer] Program  Food Insecurity:  Food Insecurity Interventions: Other (Comment) (will mail food pantry list to pt- not eligible for SNAP at this time)  Housing Insecurity:  Housing Interventions: Other (Comment) (limited income as only works a few days a week- family currently helping as needed, not behind on rent at this time)  Transportation:    Transportation Interventions: Patient Resources (Friends/Family), Other (Comment) (discussed calling PCP for rides to their appts; we are able to provide assistance with a ride to any Heart/Vascular appts as needed)    Other Care Navigation Interventions:     Provided Pharmacy assistance resources  [facility] due to transportation issues and vision challenges   Follow-up plan:   LCSW sent pt the following: my card, upcoming addresses and dates for PCP appts as well as a reminder in Spanish to call for rides, Spanish language [employer] and [employer]. I will f/u to ensure he has received those/answered calls from [employer]. I also have reached out to [facility] [employer] in [place] to see if any resources exist to assist pt further.

## 2023-08-20 ENCOUNTER — Telehealth: Payer: Self-pay | Admitting: Licensed Clinical Social Worker

## 2023-08-20 ENCOUNTER — Emergency Department (HOSPITAL_COMMUNITY)
Admission: EM | Admit: 2023-08-20 | Discharge: 2023-08-21 | Disposition: A | Discharge status: Home / Self Care | Payer: MEDICAID | Attending: Emergency Medicine | Admitting: Emergency Medicine

## 2023-08-20 ENCOUNTER — Other Ambulatory Visit: Payer: Self-pay

## 2023-08-20 ENCOUNTER — Telehealth: Payer: Self-pay

## 2023-08-20 ENCOUNTER — Ambulatory Visit
Admission: EM | Admit: 2023-08-20 | Discharge: 2023-08-20 | Disposition: A | Discharge status: Home / Self Care | Payer: MEDICAID | Attending: Family Medicine | Admitting: Family Medicine

## 2023-08-20 ENCOUNTER — Encounter: Payer: Self-pay | Admitting: *Deleted

## 2023-08-20 DIAGNOSIS — Z7982 Long term (current) use of aspirin: Secondary | ICD-10-CM | POA: Insufficient documentation

## 2023-08-20 DIAGNOSIS — L723 Sebaceous cyst: Secondary | ICD-10-CM | POA: Insufficient documentation

## 2023-08-20 DIAGNOSIS — I10 Essential (primary) hypertension: Secondary | ICD-10-CM | POA: Insufficient documentation

## 2023-08-20 DIAGNOSIS — E109 Type 1 diabetes mellitus without complications: Secondary | ICD-10-CM | POA: Insufficient documentation

## 2023-08-20 DIAGNOSIS — Z79899 Other long term (current) drug therapy: Secondary | ICD-10-CM | POA: Insufficient documentation

## 2023-08-20 DIAGNOSIS — K611 Rectal abscess: Secondary | ICD-10-CM

## 2023-08-20 LAB — COMPREHENSIVE METABOLIC PANEL WITH GFR
ALT: 31 U/L (ref 0–44)
AST: 31 U/L (ref 15–41)
Albumin: 3.3 g/dL — ABNORMAL LOW (ref 3.5–5.0)
Alkaline Phosphatase: 157 U/L — ABNORMAL HIGH (ref 38–126)
Anion gap: 10 (ref 5–15)
BUN: 22 mg/dL — ABNORMAL HIGH (ref 6–20)
CO2: 22 mmol/L (ref 22–32)
Calcium: 9.2 mg/dL (ref 8.9–10.3)
Chloride: 100 mmol/L (ref 98–111)
Creatinine, Ser: 0.89 mg/dL (ref 0.61–1.24)
GFR, Estimated: >60 mL/min
Glucose, Bld: 459 mg/dL — ABNORMAL HIGH (ref 70–99)
Potassium: 3.9 mmol/L (ref 3.5–5.1)
Sodium: 132 mmol/L — ABNORMAL LOW (ref 135–145)
Total Bilirubin: 0.9 mg/dL
Total Protein: 7.4 g/dL (ref 6.5–8.1)

## 2023-08-20 LAB — CBC
HCT: 35.1 % — ABNORMAL LOW (ref 39.0–52.0)
Hemoglobin: 12.6 g/dL — ABNORMAL LOW (ref 13.0–17.0)
MCH: 29.4 pg (ref 26.0–34.0)
MCHC: 35.9 g/dL (ref 30.0–36.0)
MCV: 81.8 fL (ref 80.0–100.0)
Platelets: 244 K/uL (ref 150–400)
RBC: 4.29 MIL/uL (ref 4.22–5.81)
RDW: 11.9 % (ref 11.5–15.5)
WBC: 7.4 K/uL (ref 4.0–10.5)
nRBC: 0 % (ref 0.0–0.2)

## 2023-08-20 LAB — LIPASE, BLOOD: Lipase: 45 U/L (ref 11–51)

## 2023-08-20 NOTE — ED Triage Notes (Signed)
Pt arrives to ED c/o abscess/pimple on left buttock since September. Pt reports that it hurts when sitting and will occasionally have purulent drainage. Pt was referred here by PCP

## 2023-08-20 NOTE — Telephone Encounter (Signed)
H&V Care Navigation CSW Progress Note  Clinical Social Worker contacted patient by phone to f/u on assistance resources sent/[facility] referral. Was able to reach pt on second call to [phone], with assistance of Spanish language interpreter Alecia Lemming [phone].  Re-introduced self, role, reason for call. Pt shares he thinks he received paperwork but isnt able to read it and shares that nobody has come over to help him/his brother works nearby but doesn't live nearby. I inquired if I could call his brother to see if we can sent paperwork to him to assist with- pt agreeable. LCSW also inquired if pt was aware of referral to third ophthalmologist, he was not, states he wants to be able to regain some vision. I shared that I see the first two providers were not able to offer repair but recommended he speak with a third provider at [facility] for another opinion- pt is also agreeable to me sharing this with his brother if I am able to reach him. He is also agreeable to me sharing information about social worker for the blind to see if eligible for any additional resources in community to assist with accessibility.   Pt is unable to confirm brother Mariano's number due to vision challenges/not being able to see phone text, thinks the number we have on file is correct. Is okay with me calling.   When asked if any additional concerns pt shares roughly that he has a "spot on his bottom that is so painful he cant sit down." I note that his PCP visit is not until December, I am unsure if there are any mobile clinics near pt. Reached out to Erskine Squibb, [facility] with community care clinics. She will contact pt with any recommendations for addressing this concern.   Patient is participating in a Managed Medicaid Plan:  No, self pay only  SDOH Screenings   Food Insecurity: Food Insecurity Present (08/13/2023)  Housing: Medium Risk (08/13/2023)  Transportation Needs: Unmet Transportation Needs (08/13/2023)  Utilities:  Not At Risk (08/13/2023)  Depression (PHQ2-9): Low Risk  (07/14/2023)  Financial Resource Strain: High Risk (08/13/2023)  Tobacco Use: Low Risk  (08/08/2023)  Health Literacy: Inadequate Health Literacy (08/13/2023)   Octavio Graves, MSW, LCSW Clinical Social Worker II [facility] Heart/Vascular Care Navigation  [phone]- work cell phone (preferred) [phone]- desk phone

## 2023-08-20 NOTE — ED Triage Notes (Signed)
Pt reports "pimple on butt" since he was in the hospital in Sept. Very painful, worse since Monday. Draining today. States painful to walk. Sent here by his doctor for evaluation. A friend drove him here tonight. States he checked his blood sugar today and it was 145. Triaged with video interpreter Emeline Gins [phone]

## 2023-08-20 NOTE — Telephone Encounter (Signed)
Message received from [place], LCSW stating she had been speaking with the patient and he mentioned that he has a " spot on his bottom" that is very painful and he is unable to sit down. His appointment with PCP is not until 12/202/24.  I called the patient with assistance of Spanish Interpreter: [phone]/[employer] to discuss his concern. He explained that he has a " lump" on his left buttocks that is about the size of a quarter, it is swollen and infected and painful when he walks.  He said that he can't sit down and needs to lay down on his side.  He also said that he has a fever.  I explained to him that he needs to be evaluated by a provider.  I told him that there are no appointments in the clinics this afternoon and I urged him to go to Urgent Care at [facility]. 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   He said he has no ride to Urgent Care but then stated he could get a cab.  He requested I text him the address of Urgent Care and he said he will get a cab and show them the address on his phone.  I stressed the importance of being assessed and he said he understood and would go today.  [facility] could [facility] an [facility] but there is no guarantee that they would still be seeing patients.  I then text him the address for Urgent Care at [facility]

## 2023-08-20 NOTE — ED Notes (Signed)
Patient is being discharged from the Urgent Care and sent to the Emergency Department via POV . Per Dr. Tracie Harrier, patient is in need of higher level of care due to possible peri-rectal abscess. Patient is aware and verbalizes understanding of plan of care.  Vitals:   08/20/23 1918  BP: (!) 133/90  Pulse: 99  Resp: 18  Temp: 98 F (36.7 C)  SpO2: 99%

## 2023-08-21 ENCOUNTER — Telehealth: Payer: Self-pay | Admitting: Licensed Clinical Social Worker

## 2023-08-21 ENCOUNTER — Emergency Department (HOSPITAL_COMMUNITY): Payer: MEDICAID

## 2023-08-21 ENCOUNTER — Encounter (HOSPITAL_COMMUNITY): Payer: Self-pay

## 2023-08-21 LAB — URINALYSIS, W/ REFLEX TO CULTURE (INFECTION SUSPECTED)
Bacteria, UA: NONE SEEN
Bilirubin Urine: NEGATIVE
Glucose, UA: >=500 mg/dL — AB
Ketones, ur: NEGATIVE mg/dL
Nitrite: NEGATIVE
Protein, ur: 100 mg/dL — AB
Specific Gravity, Urine: 1.03 (ref 1.005–1.030)
pH: 5 (ref 5.0–8.0)

## 2023-08-21 LAB — CBG MONITORING, ED: Glucose-Capillary: 295 mg/dL — ABNORMAL HIGH (ref 70–99)

## 2023-08-21 MED ORDER — IOHEXOL 350 MG/ML SOLN
75.0000 mL | Freq: Once | INTRAVENOUS | Status: AC | PRN
  Administered 2023-08-21: 75 mL via INTRAVENOUS

## 2023-08-21 MED ORDER — LIDOCAINE-EPINEPHRINE (PF) 2 %-1:200000 IJ SOLN
10.0000 mL | Freq: Once | INTRAMUSCULAR | Status: AC
  Administered 2023-08-21: 10 mL via INTRADERMAL
  Filled 2023-08-21: qty 20

## 2023-08-21 MED ORDER — SODIUM CHLORIDE 0.9 % IV BOLUS
1000.0000 mL | Freq: Once | INTRAVENOUS | Status: AC
  Administered 2023-08-21: 1000 mL via INTRAVENOUS

## 2023-08-21 MED ORDER — HYDROMORPHONE HCL 1 MG/ML IJ SOLN
0.5000 mg | Freq: Once | INTRAMUSCULAR | Status: AC
  Administered 2023-08-21: 0.5 mg via INTRAVENOUS
  Filled 2023-08-21: qty 1

## 2023-08-21 NOTE — ED Provider Notes (Signed)
[place] EMERGENCY DEPARTMENT AT [facility] Provider Note  CSN: [id number] Arrival date & time: 08/20/23 2029  Chief Complaint(s) Wound Check  HPI Drew [name] is a 47 y.o. male here for painful lump in perianal region. Ongoing for several weeks. Gradually worsening. No draining.   HPI  Past Medical History Past Medical History:  Diagnosis Date   Diabetes mellitus without complication [facility])    Patient Active Problem List   Diagnosis Date Noted   Hypertension associated with type 1 diabetes mellitus (HCC) 07/14/2023   Hyperlipidemia due to type 1 diabetes mellitus (HCC) 07/14/2023   NSTEMI (non-ST elevated myocardial infarction) (HCC) 06/17/2023   Hypertensive urgency 06/17/2023   Hypertensive emergency 06/17/2023   Hyperglycemic crisis due to diabetes mellitus (HCC) 06/17/2023   Headache 06/17/2023   Diabetic retinopathy (HCC) 06/17/2023   Blindness of right eye 06/17/2023   Insulin dependent type 1 diabetes mellitus (HCC) 06/17/2023   AKI (acute kidney injury) (HCC) 06/17/2023   DKA (diabetic ketoacidosis) (HCC) 02/14/2014   Nausea with vomiting 02/14/2014   Home Medication(s) Prior to Admission medications   Medication Sig Start Date End Date Taking? Authorizing Provider  Accu-Chek Softclix Lancets lancets Use 1 each 3 (three) times daily as directed to check blood sugar 06/18/23   Zigmund Daniel., MD  amLODipine (NORVASC) 5 MG tablet Take 1 tablet (5 mg total) by mouth daily. 07/14/23 10/13/23  Marcine Matar, MD  aspirin 81 MG chewable tablet Chew 1 tablet (81 mg total) by mouth daily. 07/14/23 10/12/23  Marcine Matar, MD  atorvastatin (LIPITOR) 20 MG tablet Take 1 tablet (20 mg total) by mouth daily. 07/14/23 10/13/23  Marcine Matar, MD  Blood Glucose Monitoring Suppl (BLOOD GLUCOSE MONITOR SYSTEM) w/Device KIT Use 3 (three) times daily. 06/18/23   Zigmund Daniel., MD  carvedilol (COREG) 3.125 MG tablet Take 1 tablet (3.125 mg  total) by mouth 2 (two) times daily with a meal. 07/14/23 10/13/23  Marcine Matar, MD  Glucose Blood (BLOOD GLUCOSE TEST STRIPS) STRP Use 3 (three) times daily as directed to check blood sugar. 06/18/23   Zigmund Daniel., MD  insulin NPH-regular Human (NOVOLIN 70/30) (70-30) 100 UNIT/ML injection Inject 42 Units into the skin 2 (two) times daily with a meal. 07/14/23   Marcine Matar, MD  Insulin Syringe-Needle U-100 Stann [place]re INSULIN SYRINGE) 30G X 1/2" 0.5 ML MISC Use as directed with insulin 06/18/23   Zigmund Daniel., MD  lisinopril (ZESTRIL) 2.5 MG tablet Take 1 tablet (2.5 mg total) by mouth daily. Pt request mail delivery 07/19/23   Marcine Matar, MD                                                                                                                                    Allergies Patient has no known allergies.  Review of Systems Review of Systems As noted in HPI  Physical Exam Vital Signs  I have reviewed the triage vital signs BP (!) 160/86   Pulse 93   Temp 99 F (37.2 C) (Oral)   Resp 18   Ht 5' 4.5" (1.638 m)   Wt 65 kg   SpO2 100%   BMI 24.22 kg/m   Physical Exam Vitals reviewed.  Constitutional:      General: He is not in acute distress.    Appearance: He is well-developed. He is not diaphoretic.  HENT:     Head: Normocephalic and atraumatic.     Right Ear: External ear normal.     Left Ear: External ear normal.     Nose: Nose normal.     Mouth/Throat:     Mouth: Mucous membranes are moist.  Eyes:     General: No scleral icterus.    Conjunctiva/sclera: Conjunctivae normal.  Neck:     Trachea: Phonation normal.  Cardiovascular:     Rate and Rhythm: Normal rate and regular rhythm.  Pulmonary:     Effort: Pulmonary effort is normal. No respiratory distress.     Breath sounds: No stridor.  Abdominal:     General: There is no distension.  Genitourinary:    Testes: Normal.        Right: Tenderness not present.         Left: Tenderness not present.    Musculoskeletal:        General: Normal range of motion.     Cervical back: Normal range of motion.  Neurological:     Mental Status: He is alert and oriented to person, place, and time.  Psychiatric:        Behavior: Behavior normal.     ED Results and Treatments Labs (all labs ordered are listed, but only abnormal results are displayed) Labs Reviewed  CBC - Abnormal; Notable for the following components:      Result Value   Hemoglobin 12.6 (*)    HCT 35.1 (*)    All other components within normal limits  COMPREHENSIVE METABOLIC PANEL - Abnormal; Notable for the following components:   Sodium 132 (*)    Glucose, Bld 459 (*)    BUN 22 (*)    Albumin 3.3 (*)    Alkaline Phosphatase 157 (*)    All other components within normal limits  URINALYSIS, W/ REFLEX TO CULTURE (INFECTION SUSPECTED) - Abnormal; Notable for the following components:   Glucose, UA >=500 (*)    Hgb urine dipstick SMALL (*)    Protein, ur 100 (*)    Leukocytes,Ua MODERATE (*)    All other components within normal limits  CBG MONITORING, ED - Abnormal; Notable for the following components:   Glucose-Capillary 295 (*)    All other components within normal limits  LIPASE, BLOOD                                                                                                                         EKG  EKG Interpretation Date/Time:    Ventricular Rate:    PR Interval:    QRS Duration:    QT Interval:    QTC Calculation:   R Axis:      Text Interpretation:         Radiology CT ABDOMEN PELVIS W CONTRAST  Result Date: 08/21/2023 CLINICAL DATA:  47 year old male with abscess, infection on left buttock since September. EXAM: CT ABDOMEN AND PELVIS WITH CONTRAST TECHNIQUE: Multidetector CT imaging of the abdomen and pelvis was performed using the standard protocol following bolus administration of intravenous contrast. RADIATION DOSE REDUCTION: This exam was performed  according to the departmental dose-optimization program which includes automated exposure control, adjustment of the mA and/or kV according to patient size and/or use of iterative reconstruction technique. CONTRAST:  75mL OMNIPAQUE IOHEXOL 350 MG/ML SOLN COMPARISON:  None Available. FINDINGS: Lower chest: Mild respiratory motion. Minor atelectasis at the lung bases. Normal heart size. No pericardial or pleural effusion. Hepatobiliary: Liver and gallbladder are within normal limits. Pancreas: Negative. Spleen: Negative. Adrenals/Urinary Tract: Adrenal glands and kidneys appear symmetric and within normal limits. Decompressed ureters. Unremarkable urinary bladder. Occasional pelvic phleboliths. Stomach/Bowel: Abnormal left buttock, left gluteal fold which is detailed below. The anal verge on series 3, image 88 appears relatively unaffected. Mild rectal and sigmoid colon retained stool, those segments otherwise appear negative. Similar retained stool in the upstream descending and transverse colon. Right colon is decompressed. Normal appendix tracking medial from the cecum on coronal image 48. Decompressed terminal ileum and no dilated small bowel. Decompressed stomach and duodenum. No pneumoperitoneum, free fluid or mesenteric inflammation. Vascular/Lymphatic: Age advanced Aortoiliac calcified atherosclerosis. Major arterial structures remain patent. Advanced femoral artery calcified atherosclerosis also series 3, image 95. Portal venous system is patent. No lymphadenopathy, including normal appearing inguinal lymph nodes. Reproductive: Negative. Other: No pelvis free fluid. Musculoskeletal: No osseous abnormality identified. Superficial left gluteal fold abscess is partially visible on series 3, image 96 with central complex fluid and rind like surrounding thickened soft tissue and regional inflammation. The lesion encompasses at least 4 cm diameter and continues caudal from the lowest level of the scan. No regional  tracking soft tissue gas. Visible scrotum appears relatively spared. IMPRESSION: 1. Partially visible superficial Abscess of the left gluteal fold, left buttock (series 3, image 96). Greater than 4 cm diameter with central complex fluid density with thick rind-like soft tissue capsule. Regional inflammation, although relatively sparing the rest of the perineum. And no tracking soft tissue gas. 2. Anal verge does not appear directly involved. And the pelvic floor, rectum appear spared. 3. No other acute or inflammatory process identified. But age advanced calcified atherosclerosis, especially partially visible femoral artery atherosclerosis, Aortic Atherosclerosis (ICD10-I70.0). Electronically Signed   By: Odessa Fleming M.D.   On: 08/21/2023 04:54    Medications Ordered in ED Medications  sodium chloride 0.9 % bolus 1,000 mL (0 mLs Intravenous Stopped 08/21/23 0353)  iohexol (OMNIPAQUE) 350 MG/ML injection 75 mL (75 mLs Intravenous Contrast Given 08/21/23 0435)  lidocaine-EPINEPHrine (XYLOCAINE W/EPI) 2 %-1:200000 (PF) injection 10 mL (10 mLs Intradermal Given 08/21/23 0537)  HYDROmorphone (DILAUDID) injection 0.5 mg (0.5 mg Intravenous Given 08/21/23 0537)   Procedures .Marland KitchenIncision and Drainage  Date/Time: 08/21/2023 6:24 AM  Performed by: Nira Conn, MD Authorized by: Nira Conn, MD   Consent:    Consent obtained:  Verbal   Consent given by:  Patient   Risks discussed:  Bleeding, damage to other organs, infection and incomplete drainage  Alternatives discussed:  Alternative treatment Universal protocol:    Imaging studies available: yes     Patient identity confirmed:  Arm band and verbally with patient Location:    Type:  Cyst   Size:  4cm   Location:  Anogenital   Anogenital location:  Perianal Pre-procedure details:    Skin preparation:  Povidone-iodine Anesthesia:    Anesthesia method:  Local infiltration   Local anesthetic:  Lidocaine 2% WITH epi Procedure  type:    Complexity:  Complex Procedure details:    Incision types:  Cruciate   Incision depth:  Subcutaneous   Wound management:  Probed and deloculated and extensive cleaning   Drainage:  Bloody and purulent   Drainage amount:  Moderate   Wound treatment:  Wound left open Post-procedure details:    Procedure completion:  Tolerated   (including critical care time) Medical Decision Making / ED Course   Medical Decision Making Amount and/or Complexity of Data Reviewed Labs: ordered. Decision-making details documented in ED Course. Radiology: ordered and independent interpretation performed. Decision-making details documented in ED Course.  Risk Prescription drug management.    Abscess versus inflamed sebaceous cyst CBC without leukocytosis. CMP with hyperglycemia.  No evidence of DKA.  Intact renal function. CT scan obtained to rule out deeper infection.  CT scan without evidence of perirectal abscess.  This appears to be more superficial in the perianal region. I&D as above is more consistent with that inflamed sebaceous cyst.  Recommended sitz bath's.  Surgery follow-up for capsule excision.  CBG downtrended after 1L IVF    Final Clinical Impression(s) / ED Diagnoses Final diagnoses:  Inflamed sebaceous cyst   The patient appears reasonably screened and/or stabilized for discharge and I doubt any other medical condition or other [facility] requiring further screening, evaluation, or treatment in the ED at this time. I have discussed the findings, Dx and Tx plan with the patient/family who expressed understanding and agree(s) with the plan. Discharge instructions discussed at length. The patient/family was given strict return precautions who verbalized understanding of the instructions. No further questions at time of discharge.  Disposition: Discharge  Condition: Good  ED Discharge Orders     None        Follow Up: Marcine Matar, MD [address] [place]  [address] [place] [address] [phone]  Call  to schedule an appointment for close follow up  [facility] [address] [place] [address] Call  to schedule an appointment for close follow up for sebaceous cyst capsult excision    This chart was dictated using voice recognition software.  Despite best efforts to proofread,  errors can occur which can change the documentation meaning.    Nira Conn, MD 08/21/23 [phone]

## 2023-08-21 NOTE — Telephone Encounter (Signed)
H&V Care Navigation CSW Progress Note  Clinical Social Worker  contacted pt brother  to attempt to engage him with assisting pt to complete applications due to vision challenges. Pt has given me verbal permission to reach out to him. This morning I left a voicemail for Detric Shaheen at [phone] with assistance of Spanish language interpreter Victorino Dike, [phone].   Pt brother returned my call and with the assistance of Spanish language interpreter Christella Scheuermann, [phone], I engaged him about what pt needs. Discussed I have mailed patient assistance to his brother but he cannot see it nor can schedule 3rd consult as recommended. Pt also unable to see number to complete an appt with financial counselor.   Pt brother does not want items mailed to him, he is unsure where pt currently lives but does know where he works. He will call his brother and ask him to bring paperwork to work so they can complete it together and then schedule an appt with [name]. Pt brother and I reviewed needed items for applications. No additional questions. He should see his brother Monday, I will f/u Tuesday to see if they were able to get some things done.   No additional questions at this time  Patient is participating in a Managed Medicaid Plan:  No, self pay only   SDOH Screenings   Food Insecurity: Food Insecurity Present (08/13/2023)  Housing: Medium Risk (08/13/2023)  Transportation Needs: Unmet Transportation Needs (08/13/2023)  Utilities: Not At Risk (08/13/2023)  Depression (PHQ2-9): Low Risk  (07/14/2023)  Financial Resource Strain: High Risk (08/13/2023)  Tobacco Use: Low Risk  (08/21/2023)  Health Literacy: Inadequate Health Literacy (08/13/2023)   Octavio Graves, MSW, LCSW Clinical Social Worker II [facility] Health Heart/Vascular Care Navigation  [phone]- work cell phone (preferred) [phone]- desk phone

## 2023-08-21 NOTE — ED Provider Notes (Signed)
  [facility]   [id number] 08/20/23 Arrival Time: 1752  ASSESSMENT & PLAN:  1. Peri-rectal abscess    Cannot r/o rectal involvement here or need for surgical intervention.. To ED for further evaluation. By POV; stable upon discharge.  Reviewed expectations re: course of current medical issues. Questions answered. Outlined signs and symptoms indicating need for more acute intervention. Patient verbalized understanding. After Visit Summary given.   SUBJECTIVE:  Drew [name] is a 47 y.o. male who presents with a possible infection of his L buttock. Pt reports "pimple on butt" since he was in the hospital in Sept. Very painful. Some drainage. States painful to walk. Sent here by his doctor for evaluation. A friend drove him here tonight. States he checked his blood sugar today and it was 145.  OBJECTIVE:  Vitals:   08/20/23 1918  BP: (!) 133/90  Pulse: 99  Resp: 18  Temp: 98 F (36.7 C)  TempSrc: Oral  SpO2: 99%    General appearance: alert; no distress GU: approx 2x3 cm protruding peri-rectal induration; tender to touch; no active drainage or bleeding Psychological: alert and cooperative; normal mood and affect  No Known Allergies  Past Medical History:  Diagnosis Date   Diabetes mellitus without complication (HCC)    Social History   Socioeconomic History   Marital status: Single    Spouse name: Not on file   Number of children: Not on file   Years of education: Not on file   Highest education level: Not on file  Occupational History   Not on file  Tobacco Use   Smoking status: Never   Smokeless tobacco: Never  Vaping Use   Vaping status: Never Used  Substance and Sexual Activity   Alcohol use: No   Drug use: No   Sexual activity: Not on file  Other Topics Concern   Not on file  Social History Narrative   Not on file   Social Determinants of Health   Financial Resource Strain: High Risk (08/13/2023)   Overall Financial Resource Strain  (CARDIA)    Difficulty of Paying Living Expenses: Hard  Food Insecurity: Food Insecurity Present (08/13/2023)   Hunger Vital Sign    Worried About Running Out of Food in the Last Year: Sometimes true    Ran Out of Food in the Last Year: Sometimes true  Transportation Needs: Unmet Transportation Needs (08/13/2023)   PRAPARE - Administrator, Civil Service (Medical): Yes    Lack of Transportation (Non-Medical): Yes  Physical Activity: Not on file  Stress: Not on file  Social Connections: Not on file   History reviewed. No pertinent family history. History reviewed. No pertinent surgical history.          Mardella Layman, MD 08/21/23 [phone]

## 2023-08-26 ENCOUNTER — Telehealth: Payer: Self-pay | Admitting: Licensed Clinical Social Worker

## 2023-08-26 NOTE — Telephone Encounter (Signed)
H&V Care Navigation CSW Progress Note  Clinical Social Worker contacted patient by phone to f/u on assistance applications and community resources. 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 Discussed need for pay stubs and proof of address. Will call pt brother (pt agreeable) next week if they haven't been able to contact [name], Artist, for [employer]. He gives permission for me to send his information to the Child psychotherapist for the Blind at [employer] as his vision issues are now giving him issues with his employer giving him hours. I have sent his name and DOB and phone number to [facility] as her coworker who I previously called did not call me back.   Patient is participating in a Managed Medicaid Plan:  No, self pay only  SDOH Screenings   Food Insecurity: Food Insecurity Present (08/13/2023)  Housing: Medium Risk (08/13/2023)  Transportation Needs: Unmet Transportation Needs (08/13/2023)  Utilities: Not At Risk (08/13/2023)  Depression (PHQ2-9): Low Risk  (07/14/2023)  Financial Resource Strain: High Risk (08/13/2023)  Tobacco Use: Low Risk  (08/21/2023)  Health Literacy: Inadequate Health Literacy (08/13/2023)    Drew [name], MSW, LCSW Clinical Social Worker II [facility] Health Heart/Vascular Care Navigation  [phone]- work cell phone (preferred) [phone]- desk phone

## 2023-09-02 ENCOUNTER — Telehealth: Payer: Self-pay | Admitting: Licensed Clinical Social Worker

## 2023-09-02 NOTE — Telephone Encounter (Signed)
H&V Care Navigation CSW Progress Note  Clinical Social Worker contacted caregiver by phone to f/u on assistance applications and to ensure they know how to schedule a drop off time with [name], Artist. Left voicemail for pt brother Lynn Ito at [phone] with assistance of Spanish language interpreter [place], #[phone]. Will re-attempt again as able.   Patient is participating in a Managed Medicaid Plan:  No, screened ineligible even for emergency Medicaid, has been sent CAFA  SDOH Screenings   Food Insecurity: Food Insecurity Present (08/13/2023)  Housing: Medium Risk (08/13/2023)  Transportation Needs: Unmet Transportation Needs (08/13/2023)  Utilities: Not At Risk (08/13/2023)  Depression (PHQ2-9): Low Risk  (07/14/2023)  Financial Resource Strain: High Risk (08/13/2023)  Tobacco Use: Low Risk  (08/21/2023)  Health Literacy: Inadequate Health Literacy (08/13/2023)    Octavio Graves, MSW, LCSW Clinical Social Worker II [facility] Health Heart/Vascular Care Navigation  [phone]- work cell phone (preferred) [phone]- desk phone

## 2023-09-04 ENCOUNTER — Telehealth: Payer: Self-pay

## 2023-09-04 ENCOUNTER — Telehealth: Payer: Self-pay | Admitting: Licensed Clinical Social Worker
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
## 2023-09-04 NOTE — Telephone Encounter (Signed)
Drew [name]- can you please call him and help schedule a ride for him to the appt with Franky Macho on 12/13?     Thanks

## 2023-09-12 ENCOUNTER — Ambulatory Visit: Payer: Self-pay | Admitting: Pharmacist

## 2023-09-16 ENCOUNTER — Telehealth (HOSPITAL_COMMUNITY): Payer: Self-pay | Admitting: *Deleted

## 2023-09-16 ENCOUNTER — Telehealth: Payer: Self-pay | Admitting: Licensed Clinical Social Worker

## 2023-09-16 ENCOUNTER — Other Ambulatory Visit: Payer: Self-pay

## 2023-09-16 NOTE — Telephone Encounter (Signed)
Attempted to call patient regarding upcoming cardiac PET appointment (via interpreter ID # [phone]). Left message on voicemail with name and callback number Johney Frame RN Navigator Cardiac Imaging [facility] Heart and Vascular Services [phone] Office

## 2023-09-16 NOTE — Telephone Encounter (Signed)
H&V Care Navigation CSW Progress Note  Clinical Social Worker contacted patient by phone to f/u on appt tomorrow. Note he had a ride scheduled for his PCP Pharmacy appt last week and did not attend. Called and left voicemail for pt with assistance of Spanish language interpreter [place], [phone]. Pt answered on second dial.  Inquired about missed appt last week- pt states he never was picked up. I am not sure if he received a call or not and due to visual impairments it is hard to figure out what happened. I updated pt about scheduled appt for imaging tomorrow at [facility]. Pt was unaware of this, does not have ride.   I scheduled a ride through [facility] and verbally reviewed waiver with interpreters assistance. I encouraged the pt to try and bring a friend with him to appt to assist with wayfinding. Pt will try and see if someone can come with him. Taxi will pick him up at 9:30am for appt. I want to leave pt enough time to get to the appt and ensure he gets to nuclear medicine.   He shares that he needs a ride to his appt on Friday at PCP and needs insulin. I will reach out to [facility], pharmD to see if pt can be rescheduled with him and insulin questions answered.   I also reached out after call to Nuclear Medicine dept at [facility]. Spoke with Dahlia Client and shared pt challenges. She and her team will make sure they keep an eye out for pt in case of issues. She also is aware pt coming via taxi and they will need to call for ride home when ready. I will fax them waiver form at [phone]. Also added information and [employer] taxi number to appt notes. Remain available as needed.   I also reached out to [facility], RN, and let her know that she may need to call pt twice as it takes him some time to get to the phone due to vision issues also unclear if he can see his phone to list to voicemails.    Patient is participating in a Managed Medicaid Plan:  No, self pay only.   SDOH Screenings    Food Insecurity: Food Insecurity Present (08/13/2023)  Housing: Medium Risk (08/13/2023)  Transportation Needs: Unmet Transportation Needs (08/13/2023)  Utilities: Not At Risk (08/13/2023)  Depression (PHQ2-9): Low Risk  (07/14/2023)  Financial Resource Strain: High Risk (08/13/2023)  Tobacco Use: Low Risk  (08/21/2023)  Health Literacy: Inadequate Health Literacy (08/13/2023)    Drew [name], MSW, LCSW Clinical Social Worker II [facility] Health Heart/Vascular Care Navigation  [phone]- work cell phone (preferred) [phone]- desk phone  09/16/2023- completed with interpreter Raffi #[id number]  Drew [name] DOB: October 17, 1975 MRN: [id number]   RENUNCIA Y EXENCIN DE RESPONSABILIDAD DEL PILOTO  Con el fin de ayudar con las necesidades de transporte, [employer] se asocia con proveedores de transporte externos (compaas de taxi, Pharmacist, community, Catering manager.) para prestarles a los pacientes de [employer] u otras personas aprobadas la opcin de viajes cuando lo necesiten ("Servicios de transporte") a nuestros edificios  para visitas que no sean de Associate Professor.  [employer] de [place], yo, la persona que firma este documento, en mi nombre o en el de Scientist, water quality legal (a mi cargo que use los Servicios  detransporte), acepto que:  Los Servicios de transporte que se me prestan los ofrecen proveedores de transporte externos e independientes que no trabajan ni tienen ninguna asociacin con [employer].  [place] no es una empresa de transporte. [place] no tiene ningn control sobre la calidad o la seguridad de los viajes que haga usando los Servicios de transporte. [place] no tiene control sobre si los viajes externos se harn a tiempo o no. [place] no garantiza la confiabilidad, calidad, seguridad o disponibilidad de ningn viaje, ni que no haya errores. S y acepto que viajar en vehculo (auto, camioneta, SUV, [place], autobs, taxi, etc.) implica riesgos de lesiones [name]  como discapacidad, parlisis y [place]. S y acepto que el riesgo de usar los Servicios de transporte es slo mo y no de [employer]. Los Servicios de transporte se prestan "tal cual" y segn la disponibilidad. Los proveedores de transporte se International aid/development worker de todas las revisiones y el cuidado de los vehculos usados para [employer] producer estos viajes. Acepto no tomar acciones legales contra , sus agentes, empleados, funcionarios, directores, representantes, aseguradores, abogados, cesionarios, sucesores, subsidiarios ni asociados en cualquier momento por cualquier motivo relacionado directa o indirectamente con el uso de los [place] de transporte. [place] acepto no tomar acciones legales contra [employer] o sus asociados por alguna lesin, muerte o dao a la propiedad ocasionado por o relacionado con el uso de los Servicios de transporte.  He ledo esta Renuncia y exencin de responsabilidad y comprendo los trminos aqu usados y su significado legal. Esta Renuncia se otorga libre y voluntariamente entendiendo que renuncio a sabiendas a mi Associate Professor (o el de Scientist, research (medical)) de Immunologist Health en relacin con los Servicios de transporte y el uso de estos servicios.  Doy fe de que lei la Exencion de viaje y exencion de responsabilidad a [name], [place] a [name] la oportunidad de Technical sales engineer preguntas y respondi las preguntas formuladas (si as hubo).  Afirmo que [employer] a ensuite donne son consentement pur obtenir de l'adie [employer] transport.  Drew [name] Drew [name]

## 2023-09-16 NOTE — Telephone Encounter (Addendum)
Reaching out to patient, via Spanish interpreter ([place] ID [phone]), to offer assistance regarding upcoming cardiac imaging study; pt verbalizes understanding of appt date/time, parking situation and where to check in, pre-test NPO status and verified current allergies; name and call back number provided for further questions should they arise  Larey Brick RN Navigator Cardiac Imaging [facility] and Vascular [phone] office [phone] cell  Patient aware avoid caffeine 12 hours prior to his cardiac PET scan.

## 2023-09-17 ENCOUNTER — Encounter (HOSPITAL_COMMUNITY)
Admission: RE | Admit: 2023-09-17 | Discharge: 2023-09-17 | Disposition: A | Discharge status: Home / Self Care | Payer: Self-pay | Source: Ambulatory Visit | Attending: Physician Assistant | Admitting: Physician Assistant

## 2023-09-17 DIAGNOSIS — R072 Precordial pain: Secondary | ICD-10-CM | POA: Insufficient documentation

## 2023-09-17 DIAGNOSIS — R7989 Other specified abnormal findings of blood chemistry: Secondary | ICD-10-CM | POA: Insufficient documentation

## 2023-09-17 LAB — NM PET CT CARDIAC PERFUSION MULTI W/ABSOLUTE BLOODFLOW
LV dias vol: 88 mL (ref 62–150)
LV sys vol: 39 mL
MBFR: 1.51
Nuc Rest EF: 56 %
Nuc Stress EF: 58 %
Rest MBF: 1.07 ml/g/min
Rest Nuclear Isotope Dose: 16.8 mCi
ST Depression (mm): 0 mm
Stress MBF: 1.62 ml/g/min
Stress Nuclear Isotope Dose: 16.8 mCi

## 2023-09-17 MED ORDER — REGADENOSON 0.4 MG/5ML IV SOLN
0.4000 mg | Freq: Once | INTRAVENOUS | Status: AC
  Administered 2023-09-17: 0.4 mg via INTRAVENOUS

## 2023-09-17 MED ORDER — RUBIDIUM RB82 GENERATOR (RUBYFILL)
16.8000 | PACK | Freq: Once | INTRAVENOUS | Status: AC
  Administered 2023-09-17: 16.8 via INTRAVENOUS

## 2023-09-17 MED ORDER — REGADENOSON 0.4 MG/5ML IV SOLN
INTRAVENOUS | Status: AC
  Filled 2023-09-17: qty 5

## 2023-09-19 ENCOUNTER — Encounter: Payer: Self-pay | Admitting: Internal Medicine

## 2023-09-19 ENCOUNTER — Ambulatory Visit: Payer: Self-pay | Attending: Internal Medicine | Admitting: Internal Medicine

## 2023-09-19 ENCOUNTER — Other Ambulatory Visit: Payer: Self-pay

## 2023-09-19 VITALS — BP 107/69 | HR 72 | Ht 64.5 in | Wt 152.4 lb

## 2023-09-19 DIAGNOSIS — E1159 Type 2 diabetes mellitus with other circulatory complications: Secondary | ICD-10-CM

## 2023-09-19 DIAGNOSIS — Z23 Encounter for immunization: Secondary | ICD-10-CM

## 2023-09-19 DIAGNOSIS — Z794 Long term (current) use of insulin: Secondary | ICD-10-CM

## 2023-09-19 DIAGNOSIS — E10319 Type 1 diabetes mellitus with unspecified diabetic retinopathy without macular edema: Secondary | ICD-10-CM

## 2023-09-19 DIAGNOSIS — E113593 Type 2 diabetes mellitus with proliferative diabetic retinopathy without macular edema, bilateral: Secondary | ICD-10-CM

## 2023-09-19 DIAGNOSIS — I152 Hypertension secondary to endocrine disorders: Secondary | ICD-10-CM

## 2023-09-19 LAB — POCT GLYCOSYLATED HEMOGLOBIN (HGB A1C): Hemoglobin A1C: 11.3 % — AB (ref 4.0–5.6)

## 2023-09-19 LAB — GLUCOSE, POCT (MANUAL RESULT ENTRY): POC Glucose: 343 mg/dL — AB (ref 70–99)

## 2023-09-19 MED ORDER — HUMULIN 70/30 KWIKPEN (70-30) 100 UNIT/ML ~~LOC~~ SUPN
40.0000 [IU] | PEN_INJECTOR | Freq: Two times a day (BID) | SUBCUTANEOUS | 11 refills | Status: DC
  Filled 2023-09-19: qty 15, 19d supply, fill #0

## 2023-09-19 MED ORDER — NOVOLIN 70/30 (70-30) 100 UNIT/ML ~~LOC~~ SUSP
42.0000 [IU] | Freq: Two times a day (BID) | SUBCUTANEOUS | 11 refills | Status: DC
  Filled 2023-09-19 (×2): qty 10, 12d supply, fill #0

## 2023-09-19 MED ORDER — INSULIN SYRINGE-NEEDLE U-100 31G X 5/16" 0.5 ML MISC
6 refills | Status: AC
  Filled 2023-09-19: qty 100, 50d supply, fill #0

## 2023-09-19 NOTE — Patient Instructions (Addendum)
Take 40 units of insulin twice a day with meals. Please bring your medications, insulin bottle and a syringe with him on follow up visit in 2 wks

## 2023-09-19 NOTE — Progress Notes (Unsigned)
Patient ID: Drew [name], male    DOB: December 29, 1975  MRN: [id number]  CC: Medical Management of Chronic Issues   Subjective: Drew [name] is a 47 y.o. male who presents for chronic ds management. 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 #Chris [id number]  Pt did not bring meds with him today despite being told to do so on last visit.   Seen in ER about 1 mth ago due to perianal abscess of LT gluteal fold.    HTN:  should be on Norvasc 5 mg daily and Coreg 3.125 mg BID.  Should also be on Lipitor 20 mg daily.  He tells me that he has 3 pills at home that he takes daily.  DM:  Results for orders placed or performed in visit on 09/19/23  POCT glucose (manual entry)   Collection Time: 09/19/23  2:32 PM  Result Value Ref Range   POC Glucose 343 (A) 70 - 99 mg/dl  POCT glycosylated hemoglobin (Hb A1C)   Collection Time: 09/19/23  2:44 PM  Result Value Ref Range   Hemoglobin A1C 11.3 (A) 4.0 - 5.6 %   HbA1c POC (<> result, manual entry)     HbA1c, POC (prediabetic range)     HbA1c, POC (controlled diabetic range)    A1C 3 mths ago A1C is 9.9.  He confirms taking NPH/Regular insulin 40 (should be 42 units) units BID.  However vision is poor; he estimates how much insulin he draws up.  Checks BS BID but reports poor vision so not sure what BS running.  Did not bring glucometer with him Did not get back in with Dr. Sherryll Burger.  Did not go to the appt because he could not see and could not find anyone to take him.   No longer able to work due to poor vision. 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  Patient Active Problem List   Diagnosis Date Noted   Hypertension associated with type 1 diabetes mellitus (HCC) 07/14/2023   Hyperlipidemia due to type 1 diabetes mellitus (HCC) 07/14/2023   NSTEMI (non-ST elevated myocardial infarction)  (HCC) 06/17/2023   Hypertensive urgency 06/17/2023   Hypertensive emergency 06/17/2023   Hyperglycemic crisis due to diabetes mellitus (HCC) 06/17/2023   Headache 06/17/2023   Diabetic retinopathy (HCC) 06/17/2023   Blindness of right eye 06/17/2023   Insulin dependent type 1 diabetes mellitus (HCC) 06/17/2023   AKI (acute kidney injury) (HCC) 06/17/2023   DKA (diabetic ketoacidosis) (HCC) 02/14/2014   Nausea with vomiting 02/14/2014     Current Outpatient Medications on File Prior to Visit  Medication Sig Dispense Refill   Accu-Chek Softclix Lancets lancets Use 1 each 3 (three) times daily as directed to check blood sugar (Patient not taking: Reported on 09/19/2023) 100 each 0   amLODipine (NORVASC) 5 MG tablet Take 1 tablet (5 mg total) by mouth daily. (Patient not taking: Reported on 09/19/2023) 90 tablet 1   aspirin 81 MG chewable tablet Chew 1 tablet (81 mg total) by mouth daily. (Patient not taking: Reported on 09/19/2023) 90 tablet 0   atorvastatin (LIPITOR) 20 MG tablet Take 1 tablet (20 mg total) by mouth daily. (Patient not taking: Reported on 09/19/2023) 90 tablet 1   Blood Glucose Monitoring Suppl (BLOOD GLUCOSE MONITOR SYSTEM) w/Device KIT Use 3 (three) times daily. (Patient not taking: Reported on  09/19/2023) 1 kit 0   carvedilol (COREG) 3.125 MG tablet Take 1 tablet (3.125 mg total) by mouth 2 (two) times daily with a meal. (Patient not taking: Reported on 09/19/2023) 180 tablet 2   Glucose Blood (BLOOD GLUCOSE TEST STRIPS) STRP Use 3 (three) times daily as directed to check blood sugar. (Patient not taking: Reported on 09/19/2023) 100 strip 0   insulin NPH-regular Human (NOVOLIN 70/30) (70-30) 100 UNIT/ML injection Inject 42 Units into the skin 2 (two) times daily with a meal. (Patient not taking: Reported on 09/19/2023) 10 mL 11   Insulin Syringe-Needle U-100 (ULTICARE INSULIN SYRINGE) 30G X 1/2" 0.5 ML MISC Use as directed with insulin (Patient not taking: Reported on  09/19/2023) 100 each 0   lisinopril (ZESTRIL) 2.5 MG tablet Take 1 tablet (2.5 mg total) by mouth daily. Pt request mail delivery (Patient not taking: Reported on 09/19/2023) 90 tablet 1   No current facility-administered medications on file prior to visit.    No Known Allergies  Social History   Socioeconomic History   Marital status: Single    Spouse name: Not on file   Number of children: Not on file   Years of education: Not on file   Highest education level: Not on file  Occupational History   Not on file  Tobacco Use   Smoking status: Never   Smokeless tobacco: Never  Vaping Use   Vaping status: Never Used  Substance and Sexual Activity   Alcohol use: No   Drug use: No   Sexual activity: Not on file  Other Topics Concern   Not on file  Social History Narrative   Not on file   Social Drivers of Health   Financial Resource Strain: High Risk (08/13/2023)   Overall Financial Resource Strain (CARDIA)    Difficulty of Paying Living Expenses: Hard  Food Insecurity: Food Insecurity Present (08/13/2023)   Hunger Vital Sign    Worried About Running Out of Food in the Last Year: Sometimes true    Ran Out of Food in the Last Year: Sometimes true  Transportation Needs: Unmet Transportation Needs (08/13/2023)   PRAPARE - Administrator, Civil Service (Medical): Yes    Lack of Transportation (Non-Medical): Yes  Physical Activity: Not on file  Stress: Not on file  Social Connections: Not on file  Intimate Partner Violence: Not At Risk (06/17/2023)   Humiliation, Afraid, Rape, and Kick questionnaire    Fear of Current or Ex-Partner: No    Emotionally Abused: No    Physically Abused: No    Sexually Abused: No    History reviewed. No pertinent family history.  History reviewed. No pertinent surgical history.  ROS: Review of Systems Negative except as stated above  PHYSICAL EXAM: BP 107/69 (BP Location: Left Arm, Patient Position: Sitting, Cuff Size:  Normal)   Pulse 72   Ht 5' 4.5" (1.638 m)   Wt 152 lb 6.4 oz (69.1 kg)   SpO2 100%   BMI 25.76 kg/m   Physical Exam   {male adult master:310785}     Latest Ref Rng & Units 08/20/2023    9:03 PM 07/14/2023    4:09 PM 06/18/2023    7:24 AM  CMP  Glucose 70 - 99 mg/dL 841  324  401   BUN 6 - 20 mg/dL 22  20  11    Creatinine 0.61 - 1.24 mg/dL 0.27  2.53  6.64   Sodium 135 - 145 mmol/L 132  139  138  Potassium 3.5 - 5.1 mmol/L 3.9  4.0  3.3   Chloride 98 - 111 mmol/L 100  101  100   CO2 22 - 32 mmol/L 22  24  25    Calcium 8.9 - 10.3 mg/dL 9.2  9.4  8.6   Total Protein 6.5 - 8.1 g/dL 7.4     Total Bilirubin <1.2 mg/dL 0.9     Alkaline Phos 38 - 126 U/L 157     AST 15 - 41 U/L 31     ALT 0 - 44 U/L 31      Lipid Panel     Component Value Date/Time   CHOL 156 06/17/2023 0415   TRIG 181 (H) 06/17/2023 0415   HDL 43 06/17/2023 0415   CHOLHDL 3.6 06/17/2023 0415   VLDL 36 06/17/2023 0415   LDLCALC 77 06/17/2023 0415    CBC    Component Value Date/Time   WBC 7.4 08/20/2023 2103   RBC 4.29 08/20/2023 2103   HGB 12.6 (L) 08/20/2023 2103   HCT 35.1 (L) 08/20/2023 2103   PLT 244 08/20/2023 2103   MCV 81.8 08/20/2023 2103   MCH 29.4 08/20/2023 2103   MCHC 35.9 08/20/2023 2103   RDW 11.9 08/20/2023 2103   LYMPHSABS 2.1 07/31/2018 1904   MONOABS 0.5 07/31/2018 1904   EOSABS 0.1 07/31/2018 1904   BASOSABS 0.0 07/31/2018 1904    ASSESSMENT AND PLAN:  Assessment and Plan              1. 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  Patient verbalized understanding of the plan and was able to repeat key elements of the plan.   This documentation was completed using Paediatric nurse.  Any transcriptional errors are  unintentional.  Orders Placed This Encounter  Procedures   POCT glycosylated hemoglobin (Hb A1C)   POCT glucose (manual entry)     Requested Prescriptions    No prescriptions requested or ordered in this encounter    No follow-ups on file.  Jonah Blue, MD, FACP

## 2023-09-27 ENCOUNTER — Telehealth: Payer: Self-pay | Admitting: Internal Medicine

## 2023-09-27 NOTE — Telephone Encounter (Signed)
-----   Message from Robyne Peers sent at 09/25/2023  6:04 PM EST ----- Regarding: RE: Diabetic Retinopathy Arna Medici, please let me know when his appointment is scheduled. I need to speak to [employer] about transportation for uninsured patients to specialty clinics.  Thanks ----- Message ----- From: Marcine Matar, MD Sent: 09/21/2023  12:52 PM EST To: Dionne Bucy; Robyne Peers, RN Subject: Diabetic Retinopathy                           This patient has severe diabetic retinopathy.  He is blind in the right eye.  He has been seeing the ophthalmologist Dr. Sherryll Burger and reports that he was scheduled for an appointment recently.  He did not go due to lack of transportation poor vision.  Can we please try to get him scheduled again?  I have CCed Erskine Squibb to see if she can arrange transportation once he gets appt.

## 2023-09-30 ENCOUNTER — Other Ambulatory Visit: Payer: Self-pay

## 2023-10-08 ENCOUNTER — Telehealth: Payer: Self-pay | Admitting: Licensed Clinical Social Worker

## 2023-10-08 NOTE — Telephone Encounter (Signed)
 H&V Care Navigation CSW Progress Note  Clinical Social Worker contacted patient by phone to f/u on assistance applications and encourage him to meet with Eric to submit during appt with Pharmacy team at [facility] tomorrow. Was unable to reach pt twice with assistance of Spanish language interpreter [place] [phone]. I have reached out to [facility] at [facility] to see if any additional contact has been made.  Patient is participating in a Managed Medicaid Plan:  No, self pay only  SDOH Screenings   Food Insecurity: Food Insecurity Present (08/13/2023)  Housing: Medium Risk (08/13/2023)  Transportation Needs: Unmet Transportation Needs (08/13/2023)  Utilities: Not At Risk (08/13/2023)  Depression (PHQ2-9): Low Risk  (09/19/2023)  Financial Resource Strain: High Risk (08/13/2023)  Tobacco Use: Low Risk  (09/19/2023)  Health Literacy: Inadequate Health Literacy (08/13/2023)    Marit Lark, MSW, LCSW Clinical Social Worker II [facility] Health Heart/Vascular Care Navigation  [phone]- work cell phone (preferred) [phone]- desk phone

## 2023-10-09 ENCOUNTER — Other Ambulatory Visit: Payer: Self-pay

## 2023-10-09 ENCOUNTER — Ambulatory Visit: Payer: Self-pay | Admitting: Pharmacist

## 2023-10-09 ENCOUNTER — Telehealth: Payer: Self-pay

## 2023-10-09 NOTE — Telephone Encounter (Signed)
 I met with the patient when he was in the clinic today. Drew [name] was the Spanish interpreter.   He was late for his appointment with Drew [name], [facility] today because he was not able to navigate from the lobby of [facility] to [facility] on the 3rd floor.  That appointment has been rescheduled to 10/27/2023.    He met with Drew [name], Financial Counselor/ [employer] and he did not have a current ID or his last pay stub so Drew was not able to assist him with CAFA/ [place] applications.  He will need to reschedule with Drew when he has the ID and pay stub.  He does not qualify for Medicaid  He said he has not been able to go to [facility] to get the ID.  There is no one to take him and he is unable to navigate in the community by himself. He said he has no support in the community. His brother is no help. He has a housemate, Drew [name], who is a 48 yo archivist who does not have a car.  Drew [name] is only able to provide minimal support.   He was very clear that he has no family/ friends in this area.   We explained the importance of obtaining the ID from [employer] [facility] but will need to find assistance from someone who can take him to [employer] [facility] or request [employer] representative meet him at his home.  We can work on getting cab transportation but he needs help navigating not only the physical [employer] office but obtaining any required documentation. I will check with Drew Plana, RN/ CNP to see if there is a Spanish speaking nurse who could assist him at home or at [employer].   He informed us  that he needs refills for all of his medications except the lisinopril , which he never picked up.  I spoke to Drew [name], Drew [name] and he requested the refills for the patient. Regarding his insulin , he said he just measures the amount of insulin  to be administered using his hand as he is not able to visualize how much is being drawn up. When I asked him about remembering to take  his medications, he said he takes them once a day, including the carvedilol  which should be taken twice daily.  He said he had been instructed to take the insulin  with meals twice daily ; but now he is not working and does not have money for food so he has only been eating once daily and therefore only administers the insulin  once daily.   He has a glucometer but his not able to use it due to his vision.   I provided him with food from our clinic food pantry and told him that I will make a referral to [facility] for assistance with obtaining groceries.   He has an appointment at [facility] on 10/16/2023 and has no transportation.  As per Drew [name], Referral Coordinator, because he is uninsured he will need to pay $209 up front to be seen. Per Drew, we don't have opthalmology with  [facility] and the [facility] card is only optometry.  He is therefore private pay for [employer].  [facility] will not provide transportation for him to the appointment   A referral to ophthalmology with [facility] or [facility] offices in [place] are possible options but he would need to apply for their financial assistance programs and  would need someone to assist him with that process. Per Drew, the first available appointment with those practices might be June.    I have discussed these concerns and barriers to patient's care with Drew [name] as well as Drew Lark, LCSW and will reach out to Drew Plana, RN for her input

## 2023-10-13 NOTE — Telephone Encounter (Signed)
 I spoke to Victoria Hussey, RN/CNP regarding patient's needs.  She explained that Araceli is no longer at [employer].    Victoria said she will contact the Glass Blower/designer at [employer] and possibly the Center for [employer] requesting assistance with helping the patient secure an ID.   She also noted that there is no one available that would be able to do a home visit to patient.     I called [employer] : [phone]  and spoke to [place] regarding the patient's upcoming appointment.  She said that the patient would need to pay $209 up front prior to being seen by the provider.  She is not aware of any patient assistance programs that would be available for the patient. She also noted that the $209 cannot be billed to the patient, he needs to pay it at the time of his appointment. She also said that they do not provide rides for patients to their office.  Deagen Krass to contact the patient and explain that the $209 is due at the time of his appointment with the eye doctor on 10/16/2023

## 2023-10-15 NOTE — Telephone Encounter (Addendum)
 Jaquon Onate informed me that she spoke to the patient about the $209 charge for the eye doctor appointment and he said he is not able to afford that.     Message received from Jeanice Millard, [facility] stating that they can provide an ID for him; but he needs to provide proof of identity, proof of residence and $10.  They would be able to meet with him 10/16/2023 between 1400-1600 to assist with ID application and connect him with a case manager.  I left a message at the Center for [facility] : [phone] requesting a call back. I would like to know what supports, if any, they can provide for the patient such as someone to accompany him to appointments, provide transportation to appointments, assist with obtaining groceries and possibly assist with medication management.   I called the patient with the assistance of Clarisa Alarcon, CMA, who is Spanish speaking.  She reviewed with him the need to pay $209 to be seen by the ophthalmologist and he said he is not able to afford that. I offered to cancel the appointment at [facility] for tomorrow and re-schedule and he was in agreement.  Clarisa then explained to him about going to [employer] and what he needs to bring in order to obtain an ID.  He said he has the documents and $10.  She inquired if he has someone to accompany him and emphasized the importance of not going alone.  He checked with his friend, Fabio Holts, while we were on the phone with him, and Fabio Holts confirmed he can go to [employer] with him tomorrow.  Clarisa explained that we can arrange cab transportation to [employer].  He said he understood and we told him that Gabreal Howick would be contacting him with ride information.  Fabio Holts does not have a car to provide a ride.   He then went on to say that he needs assistance with paying his rent $825. He is not working any longer and has no income. Clarisa explained that we can try to get some  financial assistance for this rent payment.  He stated that the lease is in his name.  He was not sure of the rental agency but the apartment is noted to be [employer].  I will need to contact the rental agency and obtain a W-9 as well as request financial assistance from the [facility].   Then he said he has no food.  He has been referred to [facility] for some grocery delivery.  Clarisa also took some food from [facility] to [facility] this afternoon for the patient to pick up when he is there tomorrow.    I sent an email to Jeanice Millard informing her of the plan for the patient to go there tomorrow including the plan for transportation. I also informed her that Wanita Gutta is dropping off some food for him.  Lanie Pitcairn, notified of the need to schedule a ride for the patient tomorrow and to contact the patient with information about the ride.   I called [facility] and spoke to [place] and cancelled tomorrow's appointment and re-scheduled for 11/17/2023 @ 1340. She then transferred me to [facility] to inquire about any patient assistance options.  She said that they only have payment plans and the patient should call Services for [facility] Blind: [phone] to inquire what assistance , if any, they can offer.

## 2023-10-16 NOTE — Telephone Encounter (Addendum)
I spoke to [employer] cab, and arranged rides for patient to and from [employer] today.  I explained that he will have a friend accompany him to the appointment today. She confirmed his pick up address and the address for [facility].  I instructed her to have him dropped off at the back of the [employer].   I emailed her the vouchers as requested and told her that [facility] will call her for pick up when he has completed his appointment there. Caryn Section, [facility], provided with the phone number for [facility]   I called [facility]: [phone] and spoke to [place].  She explained that the patient first needs to contact the [facility] SW for the Blind: [place] : [phone] for an intake/assessment.  He will need to have report from eye doctors that outlines his current condition and needs/recommendations.  Gunnar Fusi said that her department will then review those referrals from the SW for the Blind and determine from there what services they may be able to offer him  She said he does not have to have Medicaid to be eligible for their services.   I called Ladonna Snide and left a message requesting a call back.

## 2023-10-20 ENCOUNTER — Other Ambulatory Visit: Payer: Self-pay

## 2023-10-23 NOTE — Telephone Encounter (Signed)
I received a message from Caryn Section, [facility] stating that the patient was able to receive services from them but she did not specify what services.  She stated he is now connected with Wilburt Finlay, caseworker and and Kai Levins can provide more information about what they can do to support him.  Call was placed to patient with assistance of Spanish interpreter: [id number]/[employer]. I called to inquire if he received his ID when he was at [facility] and he said he did. He also said that he sent a copy of the ID and his last paycheck to Jenene Slicker, Artist, to support his application for CAFA/OC.   I reminded him that he has an appointment with Butch Penny, [facility] at [facility] on 10/27/2023 @ 1100.  He said he was not aware of the appointment and will need a ride. I told him that Cahlil Dennie will call him to schedule that ride but he should plan to be picked up at 1015. He then said that he will not be able to find our clinic on the 3rd floor so he will need assistance getting to the clinic after he is dropped off by the cab because he is not sure that Trinna Post will be able to accompany him to the appointment.  I told him that Fausto Skillern will check to see when he arrives at [facility] and can assist him to our clinic.  I instructed him to bring all his paperwork for CAFA/OC as well as his ID and last pay stub  and we can ask check to see if [name] would be able to meet with him while he is at [facility].   I also instructed him to bring all of his medications to the appointment. He said he would and then stated that he is out of some medications, including the insulin. He said he is not sure if they were ever delivered as he requested from the pharmacy.  I told him that we will need to check on that tomorrow.  Then he said he needs assistance with rent. I explained that we may be able to assist with rent payment for a month; however, he would need a plan to pay the rent  going forward which will be extremely difficult because he has no income. I can speak to him more about it on Mon 1/27 and he can sign the Patient Financial Assistance Agreement while he is here.   He went on to say that he has no food and can't prepare food near a stove/oven.  I explained to him that we will need to check additional resources because there is not an individual that will be able to make him meals on a regular basis. He has been referred to [facility] and I left a message for the SW with [facility] and am waiting to hear back from her. We can provide him with some food from our clinic food pantry when he is here on Mon.

## 2023-10-24 NOTE — Telephone Encounter (Signed)
H&V Care Navigation CSW Progress Note  Clinical Social Worker  and Erskine Squibb, RNCM, and [place], Charity fundraiser,  received the following email from [place] with [employer].  "I have learned that Drew [name] is staying with a friend/landlord (only one working) and his son, she has housed him because she needed help paying her rent.  Drew [name] has family back in his country but is alone here in the [place]. Now that Drew [name] is not working, the landlord does not want to kick him out because of his conditions. Still, they are very tight on money and sometimes they go days without food due to the lack of transportation, and money and because the breadwinner works all the time to pay all the rent by herself.   Needs the client presented: 1. ID  Drew [name] was able to have his ID.  2. Orange Card  Drew [name] was able to talk to [place] from [facility] and wants [place] from the [place] Application to fax all the supporting documents so that he is application can be reviewed.  3. Charity Application  Drew [name] brought all the documents that [place] from [facility] needed, except some pay stubs were missing, CM Kai Levins drafted a letter to support the client's information in hopes that work and faxed it to him. I will be following up with him to see if he has received it.  4. Food Assistance  Mrs. Isidor received the food from [facility] and [employer] prepared a box of food, and hygiene products and gave them more food pantries that they could go to once a week (the client explained that they could come to [facility] once a month to receive food since its close, but will struggle with transportation).  5. Rental Assistance  Since we are donation-based, we could help one month with his rent if we receive donations, but will have a hard time helping more than once with his rent which is $412) 6. Health (vision)  I'm doing a referral to [facility], they help a client no matter their status with any type of special needs.  7. Work  The client  stated the need to work. however, we are waiting for [facility] to see if this will be possible.  8. Transportation  The client stated that [facility] has been helping with transportation.  We could sometimes provide bus passes and a volunteer could drop things off, but we don't have more transportation capacity."  I responded the following:  "Thanks for all the updates! I appreciate the clarity regarding living situation as he wasn't totally clear with [place] about who he was living with! I was under the impression Drew [name] has a brother Drew [name]  (he has had [place] contact before) locally, but I don't think they keep track of each other due to transportation and communication barriers.  When [facility] was working, they worked near Risk analyst other and would see each other at that time.  I am hopeful [facility] can assist with some form of assistance, do they get back in touch with you regarding any updates? We had tried to reach out (me about a month ago, and Erskine Squibb recently) with the Child psychotherapist for the Blind at [employer] and heard nothing back at this time.   I believe one of our patient care funds through his PCP office will be able to assist with rent but given lack of clarity on his employment status/ability to receive any income moving forward we are limited with ongoing assistance much  like you all will be.   He has an appt with PCP clinic on Monday and hopefully we may be able to connect him with the financial counselor then but usually Drew [name]' work hours are Tuesday-Thursday in that clinic so i am not entirely sure.  In terms of transportation our clinics are able to pay for him to come to appts with respective providers (Heart and Vascular and PCP) but not to ancillary clinics. I also have bus passes but am worried thats not entirely safe to give to him given he cannot navigate without assistance.   Again, really appreciate all the updates. Let us know if you hear anything from [facility] and hopefully, we can move forward  with that context."  Patient is participating in a Managed Medicaid Plan:  No, self pay only  SDOH Screenings   Food Insecurity: Food Insecurity Present (08/13/2023)  Housing: Medium Risk (08/13/2023)  Transportation Needs: Unmet Transportation Needs (08/13/2023)  Utilities: Not At Risk (08/13/2023)  Depression (PHQ2-9): Low Risk  (09/19/2023)  Financial Resource Strain: High Risk (08/13/2023)  Tobacco Use: Low Risk  (09/19/2023)  Health Literacy: Inadequate Health Literacy (08/13/2023)    Octavio Graves, MSW, LCSW Clinical Social Worker II [facility] Heart/Vascular Care Navigation  [phone]- work cell phone (preferred) [phone]- desk phone

## 2023-10-24 NOTE — Telephone Encounter (Signed)
Noted

## 2023-10-27 ENCOUNTER — Encounter: Payer: Self-pay | Admitting: Pharmacist

## 2023-10-27 ENCOUNTER — Other Ambulatory Visit: Payer: Self-pay

## 2023-10-27 ENCOUNTER — Ambulatory Visit: Payer: MEDICAID | Attending: Internal Medicine | Admitting: Pharmacist

## 2023-10-27 DIAGNOSIS — E10319 Type 1 diabetes mellitus with unspecified diabetic retinopathy without macular edema: Secondary | ICD-10-CM

## 2023-10-27 DIAGNOSIS — Z794 Long term (current) use of insulin: Secondary | ICD-10-CM

## 2023-10-27 MED ORDER — BASAGLAR KWIKPEN 100 UNIT/ML ~~LOC~~ SOPN
10.0000 [IU] | PEN_INJECTOR | Freq: Every day | SUBCUTANEOUS | 3 refills | Status: DC
  Filled 2023-10-27: qty 3, 30d supply, fill #0
  Filled 2023-11-19: qty 3, 28d supply, fill #1

## 2023-10-27 MED ORDER — PEN NEEDLES 32G X 4 MM MISC
3 refills | Status: DC
  Filled 2023-10-27: qty 100, 100d supply, fill #0
  Filled 2023-11-03: qty 100, 100d supply, fill #1
  Filled 2024-05-10: qty 100, 100d supply, fill #2
  Filled 2024-08-16: qty 100, 100d supply, fill #3

## 2023-10-27 NOTE — Telephone Encounter (Signed)
I met with the patient when he was here at [facility] for his appointment with Butch Penny, RPH.  His housemate, Alex, accompanied him to the appointment; but did not participate in the education provided by [facility].   Spanish Interpreter, Mary: [id number]/[employer], assisted.  We arranged a cab ride for patient to and from the clinic appointment.   Luke spent a great deal of time instructing the patient how to administer his insulin with the pen and started him on Basaglar 10 units daily.  Franky Macho said that the patient received all of his medications, including the insulin, from [facility] at [facility] before he left the clinic. All medications were filled for 90 days except the insulin which was filled for 30 days and the patient will follow  up with [facility] in 3-4 weeks.   I reviewed the paperwork that the patient brought with him.  He wants to make sure that [name] received the ID card and pay stubs from [employer]. I made copies of those in the event [name] did not receive them.   The patient also had a letter stating that he has been enrolled with [facility].  He said that he will receive support services from Michel/ [employer] [facility] and he needs to call her but he is not able to see his phone to place the call.  He said he will need to have Alex assist him with calling her.  He was not exactly sure of what support services he would receive.   I explained to the patient that I will request assistance with his rent payment. It will be a one time grant , if approved. I also explained that he will need to have a plan for paying rent going forward. I told him that I would need to call his landlord to confirm his rent, which he said is $825/month.  I told him that I will also need to get some information from the landlord before the request is approved.  This would include a copy of  the W-9 form.  I went on to explain that there is no guarantee that this request will be approved  and multiple times reminded him that if it was, that this is a one time grant. He said he understood and after he has his appointment with the eye doctor, he should be able to go back to work in a month or 2 and would be able to pay his rent.    I explained to him that the appointment with an eye doctor does not mean he will regain eyesight or be able to work  If he is not  able to return to work, how would he support himself?  I explained that the eye doctor may recommend treatments/ medications but most likely they will  not be covered by [employer]. He was convinced that he will be able to be treated and return to working   He stated that he came to the [place] with his wife and daughter and they have since returned to [place].  He stayed here because he got sick and now he wants to get better and work so he can go back to [place] " with something." I again spoke about the reality of possibly not being able to work and he said he just can't go back yet, not until he gets better.    Before the patient, Jwan Hornbaker reviewed the Patient Assistance Acknowledgement Form and signed for  the patient as he is not able to see the document.  Fausto Skillern is Spanish speaking.

## 2023-10-27 NOTE — Progress Notes (Signed)
S:     No chief complaint on file.  48 y.o. male who presents for diabetes evaluation, education, and management. Patient arrives in good spirits and presents without any assistance.   Patient was referred and last seen by Primary Care Provider, Dr. Laural Benes, on 09/19/2023.   PMH is significant for IDDM, diabetic retinopathy with R eye blindness, HTN w/ hx of hypertensive emergency, NSTEMI type II in the setting of demand ischemia secondary to hypertensive urgency in 06/2023.   At last visit with Dr. Laural Benes, we attempted to change him to insulin pens so he can "count clicks" instead of using vial/syringe method. He refused, insisting to stay on the insulin syringes.  Patient reports Diabetes is longstanding. He has a hx of admission for DKA and has been prescribed insulin for a while. However, cost, lack of resources, blindness has all contributed to suboptimal medication adherence. He is unable to check blood sugar at home. He tells me today that he sometimes relies on the people he stays with but does not like to bother them. He has a hx of AKI  but not CKD. Last creatinine in 08/20/23 was nl. No hx of CHF or stroke.  Family/Social History:  Fhx: no known positives Tobacco: never smoker  Alcohol: none reported   Current diabetes medications include: Novolin 70/30 42u BID (has not taken in ~1 month) Current hypertension medications include: amlodipine 5 mg daily, carvedilol 3.125 mg BID, lisinopril 2.5 mg daily (not taking any of these)  Current hyperlipidemia medications include: atorvastatin 20 mg daily (not taking)  Patient reports adherence to taking all medications as prescribed.   Insurance coverage: none  Patient reports hypoglycemic events when he takes the insulin. Will experience diaphoresis and shaking within a couple of hours of his morning dose and overnight.  Reported home fasting blood sugars: not checking  Reported 2 hour post-meal/random blood sugars: not  checking.  Patient reports nocturia (nighttime urination).  Patient reports neuropathy (nerve pain). Patient reports visual changes. Patient denies self foot exams.   Patient reported dietary habits: -Eating habits vary  -Some days, he skips meals as he cannot prepare his own meals and relies on those he lives with   Patient-reported exercise habits: none    O:  No GM present. No CGM in place.  Lab Results  Component Value Date   HGBA1C 11.3 (A) 09/19/2023   There were no vitals filed for this visit.  Lipid Panel     Component Value Date/Time   CHOL 156 06/17/2023 0415   TRIG 181 (H) 06/17/2023 0415   HDL 43 06/17/2023 0415   CHOLHDL 3.6 06/17/2023 0415   VLDL 36 06/17/2023 0415   LDLCALC 77 06/17/2023 0415   Clinical Atherosclerotic Cardiovascular Disease (ASCVD): No  The ASCVD Risk score (Arnett DK, et al., 2019) failed to calculate for the following reasons:   Risk score cannot be calculated because patient has a medical history suggesting prior/existing ASCVD   Patient is participating in a Managed Medicaid Plan: No   A/P: Diabetes longstanding currently uncontrolled. Patient is symptomatic from a hyperglycemic standpoint. He is not having any current hypoglycemia but endorses almost daily hypoglycemia when he takes insulin. He is unable to verify his CBG level when this happens but is able to verbalize appropriate hypoglycemia management plan. Medication adherence is not optimal, and we have a lot of barriers here. I spent an hour with him for insulin pen counseling. After discussion, demonstration, and successful teach back, Mr. Keesey  was able to demonstrate proper injection of Basaglar. I have no way to verify the dose he was using of his 70/30. He tells me today he would draw insulin into a syringe until the plunger was "4 fingers away" from the base of the syringe. With his daily hypoglycemia, there is no telling what this dose actually was. Given that, he is  finally willing to change to the pen and we will be conservative here and dose 10u in the morning. I have also set him up with auto-refill and home delivery with the pharmacy. -Discontinued 70/30. -Start Basaglar 10u daily. I recognize he needs more than this but we have to be careful here with his history. I'd rather him have some coverage and [place] titrate gradually than cause symptomatic hypoglycemia. -Patient educated on purpose, proper use, and potential adverse effects of Basaglar -Injection technique counseled. Given instructions on counting 10 "clicks" and attaching new pen needles. Patient was able to demonstrate use after extensive counseling.  -Extensively discussed pathophysiology of diabetes, recommended lifestyle interventions, dietary effects on blood sugar control.  -Counseled on s/sx of and management of hypoglycemia.  -Next A1c anticipated 11/2023.   Written patient instructions provided. Patient verbalized understanding of treatment plan.  Total time in face to face counseling 60 minutes.    Follow-up:  Pharmacist in 3-4 weeks  Butch Penny, PharmD, [place], CPP Clinical Pharmacist [facility] [phone]

## 2023-10-28 ENCOUNTER — Other Ambulatory Visit: Payer: Self-pay

## 2023-10-28 NOTE — Telephone Encounter (Signed)
I spoke to [facility], Artist, and he explained that he is not able to proceed with the CAFA application because the patient has 2 Medicaid applications pending and has to have proof that he has filed taxes.  If he has not filed taxes, he would need to provide a letter of support and Drew [name] would have to have that approved by his Production designer, theatre/television/film.   Drew [name] plans to follow up with [facility] to determine if they have any additional information for him as he did not receive any faxed information from them.   He also said he will call the patient and provide an update.  Drew [name] stated he worked with the patient in the fall trying to help him apply for CAFA/ OC

## 2023-10-30 NOTE — Telephone Encounter (Signed)
I called Drew [name], [facility]: [phone] again to inquire about services that the patient may be eligible for,  Message left with call back requested.   I spoke to patient's landlord, Drew [name] with [employer] and explained that we are trying to get some assistance for the patient with his rent payment.  I also explained that there is no guarantee that the request for financial assistance will be approved but we can try. I informed her that I need an invoice of what his rent payment is and what he owes as well as a W-9.  She said she will email the information to me as soon as she can. I provided her with my email address.

## 2023-11-03 ENCOUNTER — Other Ambulatory Visit: Payer: Self-pay

## 2023-11-04 ENCOUNTER — Other Ambulatory Visit: Payer: Self-pay

## 2023-11-05 NOTE — Telephone Encounter (Signed)
 Brent Crocker, CMA called the patient and provided him with an update on the status of the issues we are trying to address for him.   He said that he thought he was to receive some information in the mail about CAFA;but he has not received anything yet. Calrisa told him that I would reach out to [facility], Artist and ask him to give him a call.   Clarisa informed him that his appointment with [facility] is 11/17/2023 @ 1:40 pm and he would need to pay $209 at that time in order to been seen. The patient said he does not have the money. Clarisa told him that we do not have resources to pay that for him; but will continue to see if there are any options available for assistance.  Clarisa told him that I have spoken to his landlord and have requested information that is needed in order to submit a request for financial assistance with paying the rent; however, I have not received anything from the landlord yet.   We asked if he has contacted [employer] and he said he has not but would call her today.  However, he has difficulty seeing his phone so I told him I would reach out to [place].   Clarisa asked him if he has been administering his insulin  with the pen and he confirmed that he is.     I received a call from Melene Moats, [facility] and I explained that the patient is uninsured and is in desperate need of ophthalmology care as he has been rapidly losing his vision and no longer able to work.  She explained that they will need to look at prior paystubs to see if he would qualify for their services.  She went on to say that they have a Vocational Rehab department that will work with patients to get to specialists, pay for surgery for the patient and then help them return to work in some capacity. I explained that I don't know what options for care would be offered to him but he really needs to see the ophthalmology specialist.  He has an  appointment with [facility] on 11/17/2023 and is unable to afford the copay required.  She recommended that I call Tildon Seats who is  in charge of their Vocational Rehab Department to discuss this further. The Vocational Rehab Department works with individuals who have lost their job due to loss of vision.  Montique can be reached at : [phone]. His main office number: [phone] and his email: [email or web address]   I then called and left a message for [facility] requesting a call back.

## 2023-11-10 NOTE — Telephone Encounter (Signed)
 I spoke to Drew [name] and he explained that the patient has not yet been approved for CAFA.  They are still in need to required documentation.  Drew [name] is continuing to follow up with the patient.

## 2023-11-11 NOTE — Telephone Encounter (Signed)
I spoke to Shelly/ [employer] regarding the need for the rent invoice and W-9.  She said she gave it to the patient but I told her that I didn't receive it   she said she would mail it to me.   I also called Montique Sharpe/ [facility] [employer] and had to leave a message requesting a call back.

## 2023-11-11 NOTE — Telephone Encounter (Signed)
Call received from [facility]. I explained the patient's situation and need for ophthalmology care. He is uninsured and is not eligible for insurance.  He has no income and is not able to work at this time.  If at all possible, he would like to return to work in some capacity.  I explained that he has an appointment at [facility] on 11/17/2023 and they require $209 copay at the time of the visit and the patient is not able to afford that. He also needs someone to accompany him to the appointment. He has a housemate but he is not sure if he can accompany him.   Mr Cedric Fishman said that he will reach out to the patient tomorrow with a Spanish  interpreter.  I provided him with the patient's name and phone number.  He said that he may be able to pay all or at least some of the copay for the eye appointment. Mr Cedric Fishman explained that his program is for individuals who desire to return to work  he will try to complete an application for the program over the phone with the patient if possible.  He was not sure if it would be completed by the appointment on 11/17/2023 but he will try.  He also stated that he will reach out to [place] to inquire if they have any additional information about the patient's vision history.   Mr Cedric Fishman said that don't provide transportation to appointments and the patient would have to have his own transportation.  He told me that he would provide me with an update after he speaks to the patient.

## 2023-11-12 NOTE — Telephone Encounter (Signed)
I called the patient this morning with Drew [name], Patient Access Advocate who Spanish speaking.  She explained to him that a representative from the Services for the Blind will be calling him, hopefully today, to discuss resources they can provide for him and to make sure to answer the phone. The patient said he understood.   Drew [name] also reminded him of the appointment at [facility] on Mon 11/17/2023 and that he will need to have someone accompany him to the appointment. He said as of now, he doesn't have anyone to assist him and he does not have a ride. He also noted that he does not have $209 for the copay for the visit.  Drew [name] stated again the the representative from the [facility] will be calling with possible resources for this appointment and again he said he understood.

## 2023-11-13 NOTE — Telephone Encounter (Signed)
I called Marlana Latus, Vocational Rehab Counselor- DSS : [phone] to inquire if he was able to speak to the patient and if he is keeping the appointment at [facility] on 11/17/2023 or if we need to cancel it.  Message left with call back requested.

## 2023-11-17 NOTE — Telephone Encounter (Signed)
Call placed to [facility], [facility] [employer].  Message left with call back requested.

## 2023-11-17 NOTE — Telephone Encounter (Signed)
Multiple calls made to [facility] and the line has been busy

## 2023-11-17 NOTE — Telephone Encounter (Signed)
Called & spoke to [facility]. Informed patient that ophthalmology appointment has been rescheduled to Thursday 11/27/2023 at 1:40 p.m. and that financial coverage is still being worked on by [employer]. Informed him that we will update him as soon as we receive a determination for coverage. Patient expressed verbal understanding.   Patient is expressing concern about his rent and stated that he only has $250 dollars for this months rent. He has already received a $40 penalty for being late. He is requesting assistance for this month only. The total rent is $800. He was told by a friend named Alric Seton to visit her local church this Saturday to speak to a church member for rent assistance to help with the month of March. He plans on going to see if there is any assistance available.   Patient is also concerned about blood sugar being above 300 and feels that the insulin is not helping enough. Informed patient that he has an upcoming appointment next Tuesday to see his PCP to speak about concern. Advised patient to record his BS readings before breakfast & lunch to show PCP. Patient stated he will try his best although he is unable to see. Informed patient to bring his BS meter to his visit to read the results. Patient agreed. No further questions at this time.

## 2023-11-17 NOTE — Telephone Encounter (Signed)
Call received from [facility], Voc Rehab. He explained that their agency is having difficulty securing an interpreter so he has not been able to reach out to the patient so he has not been able to determine if they will be able to pay for the eye appointment.  He said as soon as he has the interpreter, he will contact the patient and he will let me know the outcome of the conversation.  I told him that I will re-schedule the appointment for the patient.

## 2023-11-17 NOTE — Telephone Encounter (Signed)
I spoke to [facility]: [phone] and cancelled patient's appointment for today and re-schduled for 11/27/2023 @ 1340

## 2023-11-18 ENCOUNTER — Telehealth: Payer: Self-pay | Admitting: Licensed Clinical Social Worker

## 2023-11-18 NOTE — Progress Notes (Unsigned)
Cardiology Office Note:    Date:  11/21/2023   ID:  Drew [name], DOB Apr 03, 1976, MRN [id number]  PCP:  Marcine Matar, MD   [facility] Providers Cardiologist:  Parke Poisson, MD     Referring MD: Marcine Matar, MD   Chief Complaint  Patient presents with   Follow-up    3-4 months.    History of Present Illness:    Drew [name] is a 48 y.o. male with a hx of hypertension, DM2 with proliferative diabetic retinopathy.  He was admitted to [facility] 06/2023 with 2 months of vision changes resulting in vision loss in both eyes.  Troponin trend was mild and flat but BP on arrival 2 8/102.  Symptoms felt related to uncontrolled hypertension.  Echocardiogram showed preserved LVEF, mild LVH, no RWMA, RVSP 23.7 mmHg, trivial MR, trivial AI.  He was treated with aspirin, statin, amlodipine, and carvedilol.  Initially planned for outpatient ischemic evaluation.  He was seen by PA Meng in clinic 08/08/2023 and recommended for PET stress test, performed on 09/17/2023 that showed normal perfusion and no evidence of significant reversible blockage.  He was seen back in the ER 08/20/2023 with an abscess in the perianal region.  From a cardiac perspective, he is doing well, no complaints.  His biggest issue today is that he ran out of insulin last evening.  I was unable to definitively determine what insulin regimen he has been taking.  With the help of SW, I was able to reach out to PCP and Pharm.D. and he will receive a shipment this afternoon.  No chest pain, shortness of breath, dyspnea on exertion, orthopnea, or lower extremity swelling.  He does not understand that his retinopathy is due to DM.    Past Medical History:  Diagnosis Date   Diabetes mellitus without complication (HCC)     History reviewed. No pertinent surgical history.  Current Medications: Current Meds  Medication Sig   amLODipine (NORVASC) 5 MG tablet Take 1 tablet (5 mg total) by mouth daily.    atorvastatin (LIPITOR) 20 MG tablet Take 1 tablet (20 mg total) by mouth daily.   carvedilol (COREG) 3.125 MG tablet Take 1 tablet (3.125 mg total) by mouth 2 (two) times daily with a meal.   Insulin Glargine (BASAGLAR KWIKPEN) 100 UNIT/ML Inject 10 Units into the skin daily.   lisinopril (ZESTRIL) 2.5 MG tablet Take 1 tablet (2.5 mg total) by mouth daily. Pt request mail delivery     Allergies:   Patient has no known allergies.   Social History   Socioeconomic History   Marital status: Single    Spouse name: Not on file   Number of children: Not on file   Years of education: Not on file   Highest education level: Not on file  Occupational History   Not on file  Tobacco Use   Smoking status: Never   Smokeless tobacco: Never  Vaping Use   Vaping status: Never Used  Substance and Sexual Activity   Alcohol use: No   Drug use: No   Sexual activity: Not on file  Other Topics Concern   Not on file  Social History Narrative   Not on file   Social Drivers of Health   Financial Resource Strain: High Risk (08/13/2023)   Overall Financial Resource Strain (CARDIA)    Difficulty of Paying Living Expenses: Hard  Food Insecurity: Food Insecurity Present (08/13/2023)   Hunger Vital Sign    Worried About Running Out  of Food in the Last Year: Sometimes true    Ran Out of Food in the Last Year: Sometimes true  Transportation Needs: Unmet Transportation Needs (08/13/2023)   PRAPARE - Administrator, Civil Service (Medical): Yes    Lack of Transportation (Non-Medical): Yes  Physical Activity: Not on file  Stress: Not on file  Social Connections: Not on file     Family History: The patient's family history is not on file.  ROS:   Please see the history of present illness.     All other systems reviewed and are negative.  EKGs/Labs/Other Studies Reviewed:    The following studies were reviewed today:  EKG Interpretation Date/Time:  Friday November 21 2023 09:13:43  EST Ventricular Rate:  92 PR Interval:  128 QRS Duration:  88 QT Interval:  380 QTC Calculation: 469 R Axis:   63  Text Interpretation: Normal sinus rhythm Normal ECG When compared with ECG of 08-Aug-2023 10:37, Left posterior fascicular block is no longer Present Minimal criteria for Inferior infarct are no longer Present Nonspecific T wave abnormality, improved in Inferior leads Confirmed by Micah Flesher ([id number]) on 11/21/2023 9:40:44 AM    Recent Labs: 06/18/2023: Magnesium 1.8 08/20/2023: ALT 31; BUN 22; Creatinine, Ser 0.89; Hemoglobin 12.6; Platelets 244; Potassium 3.9; Sodium 132  Recent Lipid Panel    Component Value Date/Time   CHOL 156 06/17/2023 0415   TRIG 181 (H) 06/17/2023 0415   HDL 43 06/17/2023 0415   CHOLHDL 3.6 06/17/2023 0415   VLDL 36 06/17/2023 0415   LDLCALC 77 06/17/2023 0415     Risk Assessment/Calculations:                Physical Exam:    VS:  BP 112/60 (BP Location: Left Arm, Patient Position: Sitting, Cuff Size: Normal)   Pulse 92   Ht 5' 4.5" (1.638 m)   Wt 150 lb (68 kg)   BMI 25.35 kg/m     Wt Readings from Last 3 Encounters:  11/21/23 150 lb (68 kg)  09/19/23 152 lb 6.4 oz (69.1 kg)  08/20/23 143 lb 4.8 oz (65 kg)     GEN:  Well nourished, well developed in no acute distress HEENT: Normal NECK: No JVD; No carotid bruits LYMPHATICS: No lymphadenopathy CARDIAC: RRR, no murmurs, rubs, gallops RESPIRATORY:  Clear to auscultation without rales, wheezing or rhonchi  ABDOMEN: Soft, non-tender, non-distended MUSCULOSKELETAL:  No edema; No deformity  SKIN: Warm and dry NEUROLOGIC:  Alert and oriented x 3 PSYCHIATRIC:  Normal affect   ASSESSMENT:    1. Hypertensive urgency   2. Precordial pain   3. Chest pain of uncertain etiology   4. Hypertension associated with type 1 diabetes mellitus (HCC)   5. 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           Medication Adjustments/Labs and Tests Ordered: Current medicines are reviewed at length with the patient today.  Concerns regarding medicines are outlined above.  Orders Placed This Encounter  Procedures   EKG 12-Lead   No orders of the defined types were placed in this encounter.   There are no Patient Instructions on file for this visit.   Signed, Marcelino Duster, [place]  11/21/2023 10:15 AM    [facility]

## 2023-11-18 NOTE — Telephone Encounter (Signed)
H&V Care Navigation CSW Progress Note  Clinical Social Worker contacted patient by phone to f/u on transportation to upcoming appt on 2/21 at [facility]. Pt is visually impaired, Spanish speaking and we have had a hard time getting him connected to community supports due to lack to transportation and minimal/no family/friends to support- collaboratively have been working with PCP team and Laural Benes, RNCM to try and connect him best we can. I was able to reach him today at [phone] with assistance of Spanish language interpreter Hessie Diener, [phone]. Updated pt with upcoming appts Cardiology, Ophthalmology and PCP. Pt agreeable to taxi to get to appt on Friday. I shared that it is best if he can try and get his housemate Alex to come with him if possible since pt unable to see far enough to navigate around clinic/in and out of cabs alone. Pt states he is unsure if he will be able to find someone to come with him. I again encouraged him to try since we are limited in staff capability to get pt in and out of clinic and we do not have anyone to ride with him in cab to ensure he gets in and out of the house safely.   Pt asks for assistance with rent- I note that Erskine Squibb, [facility] with PCP team, has been trying unsuccessfully to get landlord to send her the appropriate information to make that request. Pt does not have up to date lease with current rental amount, and we do not have a w9 from landlord- both needed. Pt states repeatedly he doesn't understand why we can't assist, tried again to explain why we cannot help without these documents from landlord.   I also encouraged him to think about what resources he may be able to engage from family etc since he is not working. This has been discussed with pt multiple times, by multiple providers, and it is unclear if he understands that there are limited options. It is still not clear if pt is going to be able to get assistance from VocRehab for upcoming appt with eye dr since  assessment needed and they cannot provide transportation. Even if we are able to get him assistance with that appt - it is not guaranteed that a solution to vision issues would allow pt to return to work. Physical limitations are also compounded with lack of documentation paperwork needed (valid passport etc). We have thus far been unsuccessful with getting this other needed paperwork to complete [facility] Financial Assistance/[place] [employer]. Without income, employment options, valid paperwork etc we are limited in ongoing support options. Pt states he will have the rent for March but cannot tell me how/who is helping him with this. We have gotten him connected also with [employer] but their f/u with our team is limited so we are unsure where things stand with their support options. I will speak with Erskine Squibb, [facility] ongoing to make sure we know where referrals etc stand for pt.   Pt understands need to be outside with his phone for cab at 8:15am this Friday. I will try and ask the cab company to call pt several times. I will try and also coordinate to have a staff member with me to assist pt with getting upstairs and to cab at end of appt.   I have left message for [facility], Vocational Rehab at [phone].  Patient is participating in a Managed Medicaid Plan:  No, self pay only  SDOH Screenings   Food Insecurity: Food Insecurity Present (  08/13/2023)  Housing: Medium Risk (08/13/2023)  Transportation Needs: Unmet Transportation Needs (08/13/2023)  Utilities: Not At Risk (08/13/2023)  Depression (PHQ2-9): Low Risk  (09/19/2023)  Financial Resource Strain: High Risk (08/13/2023)  Tobacco Use: Low Risk  (10/27/2023)  Health Literacy: Inadequate Health Literacy (08/13/2023)    Drew [name], MSW, LCSW Clinical Social Worker II [facility] Health Heart/Vascular Care Navigation  [phone]- work cell phone (preferred) [phone]- desk phone   11/18/2023  Yvan Dority DOB: 10-Jun-1976 MRN:  [id number]   RIDER WAIVER AND RELEASE OF LIABILITY  For the purposes of helping with transportation needs, [place] partners with outside transportation providers (taxi companies, [place], Catering manager.) to give [employer] patients or other approved people the choice of on-demand rides [employer]") to our buildings for non-emergency visits.  By using [employer], I, the person signing this document, on behalf of myself and/or any legal minors (in my care using the [employer]), agree:  Science writer given to me are supplied by independent, outside transportation providers who do not work for, or have any affiliation with, [employer]. [place] is not a transportation company. [facility] has no control over the quality or safety of the rides I get using [employer]. [employer] has no control over whether any outside ride will happen on time or not. [place] gives no guarantee on the reliability, quality, safety, or availability on any rides, or that no mistakes will happen. I know and accept that traveling by vehicle (car, truck, SVU, Zenaida Niece, bus, taxi, etc.) has risks of serious injuries such as disability, being paralyzed, and death. I know and agree the risk of using [employer] is mine alone, and not [employer]. Transport Services are provided "as is" and as are available. The transportation providers are in charge for all inspections and care of the vehicles used to provide these rides. I agree not to take legal action against [employer], its agents, employees, officers, directors, representatives, insurers, attorneys, assigns, successors, subsidiaries, and affiliates at any time for any reasons related directly or indirectly to using [employer]. I also agree not to take legal action against [place] or its affiliates for any injury, death, or damage to property caused by or related to using [employer]. I have read this Waiver and  Release of Liability, and I understand the terms used in it and their legal meaning. This Waiver is freely and voluntarily given with the understanding that my right (or any legal minors) to legal action against [employer] relating to [employer] is knowingly given up to use these services.   I attest that I read the Ride Waiver and Release of Liability to [facility], gave Mr. Jasmin the opportunity to ask questions and answered the questions asked (if any). I affirm that Lucinda Dell then provided consent for assistance with transportation.     Terence Lux Kathlene Yano

## 2023-11-19 ENCOUNTER — Other Ambulatory Visit: Payer: Self-pay

## 2023-11-20 NOTE — Telephone Encounter (Signed)
I spoke to Shelly/ [employer] and explained that I have not received the invoice for patient's rent or the W-9.  She requested that I send her an email to confirm that she has my correct email address and she will then send me the documents requested.   I sent the email to [email or web address]  I then spoke to Ewell Poe, [facility] about the patient's multiple needs. She said they have a case worker who just went out on leave but should be back in about 3 weeks. In the meantime, Judeth Cornfield will reach out to [place] Meyer/[employer] to see how she/[facility] can assist with [employer]'s  initiatives to help the patient.     Judeth Cornfield said that they have ophthalmologists in [facility] who accept the [employer] but not [employer]. I explained that he has been to other ophthalmologists but I am not sure which one. She can check with Cheryll Dessert, referral coordinator if needed. I also explained the extent of his vision loss and need for specialty care.  I told her that I have been in contact with Montique Sharpe/ DSS Voc Rehab and Octavio Graves, LCSW is planning to contact him tomorrow when the patient is at his cardiology appointment. At that time, Montique will hopefully be able to obtain the necessary information from the patient that he needs to complete an intake.   Regarding transportation, Judeth Cornfield said he can qualify for TAMS and [employer] [place] but we understand that door to door rides are most suitable for him.  I also explained that not only does he need rides but he needs someone to accompany him to appointment due to his impaired vision  Judeth Cornfield is willing to help in any way she can and will update [place] when she has information to share.

## 2023-11-21 ENCOUNTER — Telehealth: Payer: Self-pay | Admitting: Licensed Clinical Social Worker

## 2023-11-21 ENCOUNTER — Other Ambulatory Visit: Payer: Self-pay

## 2023-11-21 ENCOUNTER — Encounter: Payer: Self-pay | Admitting: Physician Assistant

## 2023-11-21 ENCOUNTER — Ambulatory Visit: Payer: MEDICAID | Attending: Physician Assistant | Admitting: Physician Assistant

## 2023-11-21 ENCOUNTER — Ambulatory Visit: Payer: Self-pay | Admitting: Internal Medicine

## 2023-11-21 VITALS — BP 112/60 | HR 92 | Ht 64.5 in | Wt 150.0 lb

## 2023-11-21 DIAGNOSIS — R072 Precordial pain: Secondary | ICD-10-CM

## 2023-11-21 DIAGNOSIS — R079 Chest pain, unspecified: Secondary | ICD-10-CM

## 2023-11-21 DIAGNOSIS — E103553 Type 1 diabetes mellitus with stable proliferative diabetic retinopathy, bilateral: Secondary | ICD-10-CM

## 2023-11-21 DIAGNOSIS — E1059 Type 1 diabetes mellitus with other circulatory complications: Secondary | ICD-10-CM

## 2023-11-21 DIAGNOSIS — I160 Hypertensive urgency: Secondary | ICD-10-CM

## 2023-11-21 DIAGNOSIS — I152 Hypertension secondary to endocrine disorders: Secondary | ICD-10-CM

## 2023-11-21 NOTE — Progress Notes (Signed)
H&V Care Navigation CSW Progress Note  Clinical Social Worker arranged cab to get pt to building for appt today. Pt required assistance from this writer and clinical team member to get into clinic as he cannot see to navigate the elevators or getting around hallways. Pt medical assessment was completed with provider and [facility] Spanish Interpreter who unfortunately had to leave after PA portion completed.   I then met with patient to complete further referrals to community resources. With assistance of Spanish language interpretation by Drew Nasuti, RN, we were able to complete [employer]s transportation application and telephone interview with Drew [name] with [facility]. Pt must meet certain work documentation requirements. Drew [name] will reach out to his team lead and then f/u with this Clinical research associate. Pt shares that one of his friends has reached out to offer partial assistance with rent. I let pt know that Drew [name], also finally was able to get needed documents from landlord and sent referral to [name]. Pt unclear about how he will get assistance with rent moving forward other than he believes he will be splitting it with his roommate Drew [name] and Drew [name] who is apparently also moving in. Pt has been referred to [employer], [employer], [employer] and [employer] for [employer] and third opinion for his vision. Pt encouraged to speak with friend who is assisting with rent regarding any additional assistance or support she/any community she is part of to support pt with meals/transportation/finances as it is unclear if he will be able to return to work at this time. I also tried to stress the importance of asking for a friend/community member to come with him to appts. Shared the potential safety concerns with navigating buildings/getting in and out of cabs etc.   LCSW and Drew [name], Charity fundraiser, assisted pt back to cab and cab driver will assist pt with getting to his  door.   Patient is participating in a Managed Medicaid Plan:  No, self pay only.  SDOH Screenings   Food Insecurity: Food Insecurity Present (08/13/2023)  Housing: Medium Risk (08/13/2023)  Transportation Needs: Unmet Transportation Needs (08/13/2023)  Utilities: Not At Risk (08/13/2023)  Depression (PHQ2-9): Low Risk  (09/19/2023)  Financial Resource Strain: High Risk (08/13/2023)  Tobacco Use: Low Risk  (11/21/2023)  Health Literacy: Inadequate Health Literacy (08/13/2023)    Drew [name], MSW, LCSW Clinical Social Worker II [facility] Health Heart/Vascular Care Navigation  [phone]- work cell phone (preferred) [phone]- desk phone

## 2023-11-21 NOTE — Telephone Encounter (Signed)
H&V Care Navigation CSW Progress Note  Clinical Social Worker contacted patient by phone to f/u on update I received from [facility] regarding referral completed this morning for [facility]. He shares his supervisor shared they need documentation of work status (visa, [place] etc). I called pt and was able to reach him with assistance of Jesus, [phone], Spanish language interpreter. Discussed the above information- at this time pt does not have needed documentation. I will discuss with Erskine Squibb [facility] when she returns on Monday. Pt understands he should still attend PCP appt on Tuesday and team with [facility] will call him to arrange a ride. No additional questions at this time.   Patient is participating in a Managed Medicaid Plan:  No, self pay only  SDOH Screenings   Food Insecurity: Food Insecurity Present (08/13/2023)  Housing: Medium Risk (08/13/2023)  Transportation Needs: Unmet Transportation Needs (08/13/2023)  Utilities: Not At Risk (08/13/2023)  Depression (PHQ2-9): Low Risk  (09/19/2023)  Financial Resource Strain: High Risk (08/13/2023)  Tobacco Use: Low Risk  (11/21/2023)  Health Literacy: Inadequate Health Literacy (08/13/2023)    Octavio Graves, MSW, LCSW Clinical Social Worker II [facility] Health Heart/Vascular Care Navigation  [phone]- work cell phone (preferred) [phone]- desk phone

## 2023-11-21 NOTE — Patient Instructions (Signed)
Medication Instructions:  No changes. *If you need a refill on your cardiac medications before your next appointment, please call your pharmacy*   Follow-Up: At [facility], you and your health needs are our priority.  As part of our continuing mission to provide you with exceptional heart care, we have created designated Provider Care Teams.  These Care Teams include your primary Cardiologist (physician) and Advanced Practice Providers (APPs -  Physician Assistants and Nurse Practitioners) who all work together to provide you with the care you need, when you need it.  We recommend signing up for the patient portal called "MyChart".  Sign up information is provided on this After Visit Summary.  MyChart is used to connect with patients for Virtual Visits (Telemedicine).  Patients are able to view lab/test results, encounter notes, upcoming appointments, etc.  Non-urgent messages can be sent to your provider as well.   To learn more about what you can do with MyChart, go to [email or web address].    Your next appointment:   4 month(s)  Provider:   Parke Poisson, MD     Other Instructions

## 2023-11-24 NOTE — Telephone Encounter (Signed)
 H&V Care Navigation CSW Progress Note  Clinical Social Worker  contacted [employer] for the [employer]  to update him on needed paperwork. At this time without needed paperwork [place]/[facility] likely is not able to accept referral per his leadership. If documents located [place] encouraged [place] to let him know.  Patient is participating in a Managed Medicaid Plan:  No, self pay only  SDOH Screenings   Food Insecurity: Food Insecurity Present (08/13/2023)  Housing: Medium Risk (08/13/2023)  Transportation Needs: Unmet Transportation Needs (08/13/2023)  Utilities: Not At Risk (08/13/2023)  Depression (PHQ2-9): Low Risk  (09/19/2023)  Financial Resource Strain: High Risk (08/13/2023)  Tobacco Use: Low Risk  (11/21/2023)  Health Literacy: Inadequate Health Literacy (08/13/2023)    Octavio Graves, MSW, LCSW Clinical Social Worker II [facility] Health Heart/Vascular Care Navigation  [phone]- work cell phone (preferred) [phone]- desk phone

## 2023-11-24 NOTE — Telephone Encounter (Signed)
 Noted. I spoke to [place] who elaborated on her meeting with the patient on 11/21/2023.

## 2023-11-24 NOTE — Telephone Encounter (Signed)
 Drew [name], PAA spoke to patient and arranged a cab ride for him to his appointment at [facility] tomorrow.  Pick up at 1000 was scheduled with [facility]. She asked him to bring the following documents with him  if he has them available: a letter from the employer, his old Passport or [place] ID, a letter from the landlord and he told her that he is not sure he will have them

## 2023-11-24 NOTE — Telephone Encounter (Signed)
 Documents received from the property management company.  A request has been sent to the [facility] for assistance with paying the patient's rent

## 2023-11-25 ENCOUNTER — Other Ambulatory Visit: Payer: Self-pay

## 2023-11-25 ENCOUNTER — Ambulatory Visit: Payer: Self-pay | Attending: Internal Medicine | Admitting: Internal Medicine

## 2023-11-25 VITALS — BP 138/80 | HR 102 | Ht 64.0 in | Wt 147.0 lb

## 2023-11-25 DIAGNOSIS — R197 Diarrhea, unspecified: Secondary | ICD-10-CM

## 2023-11-25 DIAGNOSIS — E1159 Type 2 diabetes mellitus with other circulatory complications: Secondary | ICD-10-CM

## 2023-11-25 DIAGNOSIS — Z758 Other problems related to medical facilities and other health care: Secondary | ICD-10-CM

## 2023-11-25 DIAGNOSIS — Z603 Acculturation difficulty: Secondary | ICD-10-CM

## 2023-11-25 DIAGNOSIS — Z794 Long term (current) use of insulin: Secondary | ICD-10-CM

## 2023-11-25 DIAGNOSIS — I152 Hypertension secondary to endocrine disorders: Secondary | ICD-10-CM

## 2023-11-25 DIAGNOSIS — E113593 Type 2 diabetes mellitus with proliferative diabetic retinopathy without macular edema, bilateral: Secondary | ICD-10-CM

## 2023-11-25 LAB — GLUCOSE, POCT (MANUAL RESULT ENTRY): POC Glucose: 448 mg/dL — AB (ref 70–99)

## 2023-11-25 MED ORDER — METFORMIN HCL ER 500 MG PO TB24
500.0000 mg | ORAL_TABLET | Freq: Every day | ORAL | 1 refills | Status: DC
  Filled 2023-11-25: qty 90, 90d supply, fill #0

## 2023-11-25 MED ORDER — METFORMIN HCL ER 500 MG PO TB24
500.0000 mg | ORAL_TABLET | Freq: Every day | ORAL | 1 refills | Status: DC
Start: 2023-11-25 — End: 2024-01-27
  Filled 2023-11-25: qty 90, 90d supply, fill #0

## 2023-11-25 MED ORDER — LOPERAMIDE HCL 2 MG PO TABS
2.0000 mg | ORAL_TABLET | Freq: Three times a day (TID) | ORAL | 0 refills | Status: DC | PRN
  Filled 2023-11-25: qty 24, 8d supply, fill #0

## 2023-11-25 MED ORDER — LOPERAMIDE HCL 2 MG PO TABS
2.0000 mg | ORAL_TABLET | Freq: Three times a day (TID) | ORAL | 0 refills | Status: DC | PRN
  Filled 2023-11-25: qty 48, 16d supply, fill #0

## 2023-11-25 MED ORDER — BASAGLAR KWIKPEN 100 UNIT/ML ~~LOC~~ SOPN
20.0000 [IU] | PEN_INJECTOR | Freq: Every day | SUBCUTANEOUS | 4 refills | Status: DC
  Filled 2023-11-25: qty 3, 15d supply, fill #0

## 2023-11-25 MED ORDER — BASAGLAR KWIKPEN 100 UNIT/ML ~~LOC~~ SOPN
20.0000 [IU] | PEN_INJECTOR | Freq: Every day | SUBCUTANEOUS | 4 refills | Status: DC
  Filled 2023-11-25 – 2023-12-01 (×5): qty 3, 15d supply, fill #0

## 2023-11-25 NOTE — Progress Notes (Unsigned)
 Patient ID: Drew [name], male    DOB: October 25, 1975  MRN: [id number]  CC: Diabetes (DM f/u. Franchot Erichsen insulin - current insulin is causing dryness of mouth /)   Subjective: Drew [name] is a 48 y.o. male who presents for 2 mths f/u for chronic ds management. 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 #Astrid [id number]  Pt did not bring meds with him today  DM with retinopathy:  saw clinical pharmacist since last visit with me.  Changed from syringes/viles of 70/30 insulin to Basaglar Pen 10 units daily.  Pt feels the pen is not good for him.  Reports it causes dry mouth and he thinks it has caused wgh loss Checks BS once a day in the evenings and reports BS in the 200.  Reports when he did the insulin viles/syringes, BS stayed around 130-140. He self increased the Basaglar insulin to 16 units a few days ago.  Reports when he eats, his bowel sounds become noisy then he has to have BM; this has been going on for 1 mth.  Endorses diarrhea daily when he drinks milk  and when he eats.   HTN:  should be on Lisinopril 2.5 mg, Norvasc 5 mg daily and Coreg 3.125 mg twice a day. He tells me he is taking every thing he has at home but does not know the names and did not bring meds with him.  Pt has a complicated social situation.  Our CW has been trying to get him approved for [employer] [facility] so hopefully he can get his visits to ophthalmology paid for. However, pt does not have the documentation needed to complete application.  Has difficulty affording rent as he no longer works due to vision impairment.  Cousin who is with him today checks on him about once a mth.  Patient Active Problem List   Diagnosis Date Noted   Hypertension associated with type 1  diabetes mellitus (HCC) 07/14/2023   Hyperlipidemia due to type 1 diabetes mellitus (HCC) 07/14/2023   NSTEMI (non-ST elevated myocardial infarction) (HCC) 06/17/2023   Hypertensive urgency 06/17/2023   Hypertensive emergency 06/17/2023   Hyperglycemic crisis due to diabetes mellitus (HCC) 06/17/2023   Headache 06/17/2023   Diabetic retinopathy (HCC) 06/17/2023   Blindness of right eye 06/17/2023   Insulin dependent type 1 diabetes mellitus (HCC) 06/17/2023   AKI (acute kidney injury) (HCC) 06/17/2023   DKA (diabetic ketoacidosis) (HCC) 02/14/2014   Nausea with vomiting 02/14/2014     Current Outpatient Medications on File Prior to Visit  Medication Sig Dispense Refill   amLODipine (NORVASC) 5 MG tablet Take 1 tablet (5 mg total) by mouth daily. 90 tablet 1   atorvastatin (LIPITOR) 20 MG tablet Take 1 tablet (20 mg total) by mouth daily. 90 tablet 1   carvedilol (COREG) 3.125 MG tablet Take 1 tablet (3.125 mg total) by mouth 2 (two) times daily with a meal. 180 tablet 2   lisinopril (ZESTRIL) 2.5 MG tablet Take 1 tablet (2.5 mg total) by mouth daily. Pt request mail delivery 90 tablet 1   Blood Glucose Monitoring Suppl (BLOOD GLUCOSE MONITOR SYSTEM) w/Device KIT Use 3 (three) times daily. (Patient not taking: Reported on 11/25/2023) 1 kit 0   Glucose Blood (BLOOD GLUCOSE TEST  STRIPS) STRP Use 3 (three) times daily as directed to check blood sugar. (Patient not taking: Reported on 11/25/2023) 100 strip 0   Insulin Pen Needle (PEN NEEDLES) 32G X 4 MM MISC Use to inject Basaglar once daily. (Patient not taking: Reported on 11/25/2023) 100 each 3   Insulin Syringe-Needle U-100 (TRUEPLUS INSULIN SYRINGE) 31G X 5/16" 0.5 ML MISC Use to inject 70/30 insulin twice daily. (Patient not taking: Reported on 11/25/2023) 100 each 6   No current facility-administered medications on file prior to visit.    No Known Allergies  Social History   Socioeconomic History   Marital status: Single    Spouse  name: Not on file   Number of children: Not on file   Years of education: Not on file   Highest education level: Not on file  Occupational History   Not on file  Tobacco Use   Smoking status: Never   Smokeless tobacco: Never  Vaping Use   Vaping status: Never Used  Substance and Sexual Activity   Alcohol use: No   Drug use: No   Sexual activity: Not on file  Other Topics Concern   Not on file  Social History Narrative   Not on file   Social Drivers of Health   Financial Resource Strain: High Risk (08/13/2023)   Overall Financial Resource Strain (CARDIA)    Difficulty of Paying Living Expenses: Hard  Food Insecurity: Food Insecurity Present (08/13/2023)   Hunger Vital Sign    Worried About Running Out of Food in the Last Year: Sometimes true    Ran Out of Food in the Last Year: Sometimes true  Transportation Needs: Unmet Transportation Needs (08/13/2023)   PRAPARE - Administrator, Civil Service (Medical): Yes    Lack of Transportation (Non-Medical): Yes  Physical Activity: Not on file  Stress: Not on file  Social Connections: Not on file  Intimate Partner Violence: Not At Risk (06/17/2023)   Humiliation, Afraid, Rape, and Kick questionnaire    Fear of Current or Ex-Partner: No    Emotionally Abused: No    Physically Abused: No    Sexually Abused: No    No family history on file.  No past surgical history on file.  ROS: Review of Systems Negative except as stated above  PHYSICAL EXAM: BP 138/80 (BP Location: Left Arm, Patient Position: Sitting, Cuff Size: Normal)   Pulse (!) 102   Ht 5\' 4"  (1.626 m)   Wt 147 lb (66.7 kg)   SpO2 99%   BMI 25.23 kg/m   Wt Readings from Last 3 Encounters:  11/25/23 147 lb (66.7 kg)  11/21/23 150 lb (68 kg)  09/19/23 152 lb 6.4 oz (69.1 kg)    Physical Exam   General appearance - alert, well appearing, middle age hispanic male and in no distress Mental status - normal mood, behavior, speech, dress, motor  activity, and thought processes Neck - supple, no significant adenopathy Chest - clear to auscultation, no wheezes, rales or rhonchi, symmetric air entry Heart - normal rate, regular rhythm, normal S1, S2, no murmurs, rubs, clicks or gallops Extremities - peripheral pulses normal, no pedal edema, no clubbing or cyanosis     Latest Ref Rng & Units 08/20/2023    9:03 PM 07/14/2023    4:09 PM 06/18/2023    7:24 AM  CMP  Glucose 70 - 99 mg/dL 161  096  045   BUN 6 - 20 mg/dL 22  20  11  Creatinine 0.61 - 1.24 mg/dL 4.09  8.11  9.14   Sodium 135 - 145 mmol/L 132  139  138   Potassium 3.5 - 5.1 mmol/L 3.9  4.0  3.3   Chloride 98 - 111 mmol/L 100  101  100   CO2 22 - 32 mmol/L 22  24  25    Calcium 8.9 - 10.3 mg/dL 9.2  9.4  8.6   Total Protein 6.5 - 8.1 g/dL 7.4     Total Bilirubin <1.2 mg/dL 0.9     Alkaline Phos 38 - 126 U/L 157     AST 15 - 41 U/L 31     ALT 0 - 44 U/L 31      Lipid Panel     Component Value Date/Time   CHOL 156 06/17/2023 0415   TRIG 181 (H) 06/17/2023 0415   HDL 43 06/17/2023 0415   CHOLHDL 3.6 06/17/2023 0415   VLDL 36 06/17/2023 0415   LDLCALC 77 06/17/2023 0415    CBC    Component Value Date/Time   WBC 7.4 08/20/2023 2103   RBC 4.29 08/20/2023 2103   HGB 12.6 (L) 08/20/2023 2103   HCT 35.1 (L) 08/20/2023 2103   PLT 244 08/20/2023 2103   MCV 81.8 08/20/2023 2103   MCH 29.4 08/20/2023 2103   MCHC 35.9 08/20/2023 2103   RDW 11.9 08/20/2023 2103   LYMPHSABS 2.1 07/31/2018 1904   MONOABS 0.5 07/31/2018 1904   EOSABS 0.1 07/31/2018 1904   BASOSABS 0.0 07/31/2018 1904   Results for orders placed or performed in visit on 11/25/23  POCT glucose (manual entry)   Collection Time: 11/25/23  4:31 PM  Result Value Ref Range   POC Glucose 448 (A) 70 - 99 mg/dl    ASSESSMENT AND PLAN: 1. 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 Recommend that we stick with the insulin pen given his vision impairment.  This allows for him to accurately dial up the amount of insulin that he should be taking.  Advised that the weight loss and dry mouth are likely due to uncontrolled diabetes and not the insulin.  I recommend increasing the Basaglar to 20 units daily.  Will start low-dose of metformin XR 500 mg daily.  Continue to check blood sugars.  He has an appointment later this week with our clinical pharmacist. - POCT glucose (manual entry) - Insulin Glargine (BASAGLAR KWIKPEN) 100 UNIT/ML; Inject 20 Units into the skin daily.  Dispense: 3 mL; Refill: 4 - metFORMIN (GLUCOPHAGE-XR) 500 MG 24 hr tablet; Take 1 tablet (500 mg total) by mouth daily with breakfast.  Dispense: 90 tablet; Refill: 1  2. Hypertension associated with type 2 diabetes mellitus (HCC) Not at goal.  Patient should be on the medications listed above.  However the only way to confirm that he is taking is to have him bring his medicines with him at every appointment.  I reminded him to do so at his next visit.  3. Diarrhea, unspecified type Patient reporting what sounds like increase abdominal bloating and some diarrhea.  Differential diagnosis includes bacterial overgrowth in the small intestine versus celiac ds vs lactose/dairy intolerance versus infectious etiology.  Will try to do stool culture and screen for celiac.  Advised patient to stop drinking milk or change to Lactaid milk.  Imodium  given to be used as needed. - Cdiff NAA+O+P+Stool Culture - loperamide (IMODIUM A-D) 2 MG tablet; Take 1 tablet (2 mg total) by mouth 3 (three) times daily as needed for diarrhea or loose stools.  Dispense: 24 tablet; Refill: 0 - Tissue Transglutaminase, IGA  4. Language barrier affecting health care Patient has significant barriers including language,  visual impairment, lack of family and social support system and financial barriers.  Case worker has been working with him to try overcome some of these barriers but it remains challenging   Patient was given the opportunity to ask questions.  Patient verbalized understanding of the plan and was able to repeat key elements of the plan.   This documentation was completed using Paediatric nurse.  Any transcriptional errors are unintentional.  Orders Placed This Encounter  Procedures   Cdiff NAA+O+P+Stool Culture   POCT glucose (manual entry)     Requested Prescriptions   Signed Prescriptions Disp Refills   Insulin Glargine (BASAGLAR KWIKPEN) 100 UNIT/ML 3 mL 4    Sig: Inject 20 Units into the skin daily.   metFORMIN (GLUCOPHAGE-XR) 500 MG 24 hr tablet 90 tablet 1    Sig: Take 1 tablet (500 mg total) by mouth daily with breakfast.   loperamide (IMODIUM A-D) 2 MG tablet 24 tablet 0    Sig: Take 1 tablet (2 mg total) by mouth 3 (three) times daily as needed for diarrhea or loose stools.    Return in about 2 months (around 01/23/2024).  Jonah Blue, MD, FACP

## 2023-11-25 NOTE — Telephone Encounter (Signed)
 Leana Roe, PAA assisted the patient with completing Part A of an [employer] [place] application.   He was accompanied by his cousin who lives in [place].  I met with the patient and his cousin while they were in the clinic.  Eustace Moore, CMA interpreted.  I inquired how often his cousin is able to provide assistance and he said only once every 1-2 months because he does not libe in [place] and he works. The patient denied that he has any other family/ friends in the area that can offer him any assistance/ support.   I asked him if he has anyone that can accompany him to the eye appointment this week and if he has the $209 copay required by [facility].  He said he does not have the co-pay and he does not think he has anyone to accompany him to that appointment, so I told him that I will re-schedule it for him and he can continue to work on arranging for someone to accompany him to the appointment and also work on securing the required co-pay.   Regarding the rent, I told him that I have submitted the request for financial assistance and am waiting for a response to the request. I also explained again that if this request is approved, this would be a one time grant and he would be responsible for his rent going forward.   He did not have the documents that were requested for the [facility] or [employer].   Later this afternoon, I received notification that the [facility] approved a grant of $1000  for his rent.

## 2023-11-26 ENCOUNTER — Encounter: Payer: Self-pay | Admitting: Internal Medicine

## 2023-11-26 ENCOUNTER — Other Ambulatory Visit: Payer: Self-pay

## 2023-11-26 NOTE — Telephone Encounter (Signed)
Completed [employer] [place] application emailed to [employer]

## 2023-11-26 NOTE — Telephone Encounter (Signed)
 I spoke to Judy/ [place] and re-scheduled the patient's appointment to 12/11/2023 @ 1230.

## 2023-11-27 ENCOUNTER — Other Ambulatory Visit: Payer: Self-pay

## 2023-11-27 NOTE — Telephone Encounter (Signed)
 Drew [name]. PAA stated she arranged cab transportation for the patient to his appointment at [facility] tomorrow with Georgiana Shore Ausdall, RPH.

## 2023-11-28 ENCOUNTER — Other Ambulatory Visit: Payer: Self-pay

## 2023-11-28 ENCOUNTER — Ambulatory Visit: Payer: Self-pay | Admitting: Pharmacist

## 2023-12-01 ENCOUNTER — Other Ambulatory Visit: Payer: Self-pay

## 2023-12-01 ENCOUNTER — Other Ambulatory Visit: Payer: Self-pay | Admitting: Pharmacist

## 2023-12-01 DIAGNOSIS — E113593 Type 2 diabetes mellitus with proliferative diabetic retinopathy without macular edema, bilateral: Secondary | ICD-10-CM

## 2023-12-01 MED ORDER — BASAGLAR KWIKPEN 100 UNIT/ML ~~LOC~~ SOPN
20.0000 [IU] | PEN_INJECTOR | Freq: Every day | SUBCUTANEOUS | 1 refills | Status: DC
  Filled 2023-12-01: qty 6, 30d supply, fill #0
  Filled 2023-12-01: qty 18, 90d supply, fill #0
  Filled 2023-12-25: qty 6, 30d supply, fill #1
  Filled 2024-01-26: qty 6, 30d supply, fill #2

## 2023-12-03 NOTE — Telephone Encounter (Signed)
 Approval received from the [facility] for $1000 for patient's rent.  Octavio Graves, LCSW to deliver the check to [employer] tomorrow.  I spoke to Shelly/ [employer] and informed her of the approval for the rent and that the check will be delivered tomorrow. She confirmed the [address] address and said she will be there from 0900-1600 tomorrow.

## 2023-12-04 ENCOUNTER — Telehealth: Payer: Self-pay

## 2023-12-04 NOTE — Telephone Encounter (Addendum)
 Eustace Moore, CMA called the patient and informed him that our patient assistance fund paid $1000 toward his rent.    Clarisa also informed him that his appointment at [facility] is 12/11/2023 and he will need to have the $209 co-pay as well as transportation and someone to accompany him to the appointment.  He said he does not have the money and was agreeable to cancelling the appointment and re-scheduling when he has the funds.  She explained to him that the agency ( [facility]) that may have been able to provide assistance with the $209 co-pay is not able to assist because he did not have the required documentation for their program. He said he was not aware what documents are needed.  Clarisa told him that I would check on that we will call him back.    He said he missed his appointment with Isac Caddy, RPH last week because his ride never picked him up.  He has been rescheduled with [facility] 01/05/2024.  The patient then requested a refill of imodium stating that he is feeling better since he has been taking it. He went on to say that he has been taking the imodium 1 tablets 3 times/day.  He continues to have 3 stools/ day that are more formed.  He is going to come to the lab at [facility] tomorrow, 3/7/202 for cdiff culture.  Leana Roe, PAA has arranged cab transportation for him with pick up at 1030.

## 2023-12-05 ENCOUNTER — Ambulatory Visit: Payer: MEDICAID | Attending: Family Medicine

## 2023-12-05 ENCOUNTER — Other Ambulatory Visit: Payer: Self-pay

## 2023-12-05 ENCOUNTER — Telehealth: Payer: Self-pay

## 2023-12-05 ENCOUNTER — Ambulatory Visit: Payer: MEDICAID | Attending: Internal Medicine | Admitting: Internal Medicine

## 2023-12-05 ENCOUNTER — Encounter: Payer: Self-pay | Admitting: Internal Medicine

## 2023-12-05 VITALS — BP 128/76 | HR 92 | Ht 64.0 in | Wt 149.0 lb

## 2023-12-05 DIAGNOSIS — N481 Balanitis: Secondary | ICD-10-CM

## 2023-12-05 MED ORDER — MICONAZOLE NITRATE 2 % EX CREA
1.0000 | TOPICAL_CREAM | Freq: Two times a day (BID) | CUTANEOUS | 0 refills | Status: AC
  Filled 2023-12-05: qty 30, 15d supply, fill #0

## 2023-12-05 NOTE — Progress Notes (Signed)
 Patient ID: Drew [name], male    DOB: June 08, 1976  MRN: [id number]  CC: Rash (Rash on penis, painful urination X4 mo /)   Subjective: Drew [name] is a 48 y.o. 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 #[id number], Luis  Pt c/o rash on penis x 1 wk; does get itchy "or something like that." No discharge from penis Hurts when he goes to urinated because the skin around the head of the penis is cracked No burning when he urinates Currently not sexually active, spouse is in [place]. Last sexually active with her 1 yr ago.  Patient Active Problem List   Diagnosis Date Noted   Hypertension associated with type 1 diabetes mellitus (HCC) 07/14/2023   Hyperlipidemia due to type 1 diabetes mellitus (HCC) 07/14/2023   NSTEMI (non-ST elevated myocardial infarction) (HCC) 06/17/2023   Hypertensive urgency 06/17/2023   Hypertensive emergency 06/17/2023   Hyperglycemic crisis due to diabetes mellitus (HCC) 06/17/2023   Headache 06/17/2023   Diabetic retinopathy (HCC) 06/17/2023   Blindness of right eye 06/17/2023   Insulin dependent type 1 diabetes mellitus (HCC) 06/17/2023   AKI (acute kidney injury) (HCC) 06/17/2023   DKA (diabetic ketoacidosis) (HCC) 02/14/2014   Nausea with vomiting 02/14/2014     Current Outpatient Medications on File Prior to Visit  Medication Sig Dispense Refill   amLODipine (NORVASC) 5 MG tablet Take 1 tablet (5 mg total) by mouth daily. 90 tablet 1   atorvastatin (LIPITOR) 20 MG tablet Take 1 tablet (20 mg total) by mouth daily. 90 tablet 1   Blood Glucose Monitoring Suppl (BLOOD GLUCOSE MONITOR SYSTEM) w/Device KIT Use 3 (three) times daily. (Patient not taking: Reported on 11/25/2023) 1 kit 0   carvedilol (COREG) 3.125 MG tablet Take 1 tablet (3.125 mg  total) by mouth 2 (two) times daily with a meal. 180 tablet 2   Glucose Blood (BLOOD GLUCOSE TEST STRIPS) STRP Use 3 (three) times daily as directed to check blood sugar. (Patient not taking: Reported on 11/25/2023) 100 strip 0   Insulin Glargine (BASAGLAR KWIKPEN) 100 UNIT/ML Inject 20 Units into the skin daily. 18 mL 1   Insulin Pen Needle (PEN NEEDLES) 32G X 4 MM MISC Use to inject Basaglar once daily. (Patient not taking: Reported on 11/25/2023) 100 each 3   Insulin Syringe-Needle U-100 (TRUEPLUS INSULIN SYRINGE) 31G X 5/16" 0.5 ML MISC Use to inject 70/30 insulin twice daily. (Patient not taking: Reported on 11/25/2023) 100 each 6   lisinopril (ZESTRIL) 2.5 MG tablet Take 1 tablet (2.5 mg total) by mouth daily. Pt request mail delivery 90 tablet 1   loperamide (IMODIUM A-D) 2 MG tablet Take 1 tablet (2 mg total) by mouth 3 (three) times daily as needed for diarrhea or loose stools. 24 tablet 0   metFORMIN (GLUCOPHAGE-XR) 500 MG 24 hr tablet Take 1 tablet (500 mg total) by mouth daily with breakfast. 90 tablet 1   No current facility-administered medications on file prior to visit.    No Known Allergies  Social History   Socioeconomic History   Marital status: Single    Spouse name: Not on file   Number of children: Not on file   Years of education: Not on file   Highest education level: Not on file  Occupational History   Not  on file  Tobacco Use   Smoking status: Never   Smokeless tobacco: Never  Vaping Use   Vaping status: Never Used  Substance and Sexual Activity   Alcohol use: No   Drug use: No   Sexual activity: Not on file  Other Topics Concern   Not on file  Social History Narrative   Not on file   Social Drivers of Health   Financial Resource Strain: High Risk (08/13/2023)   Overall Financial Resource Strain (CARDIA)    Difficulty of Paying Living Expenses: Hard  Food Insecurity: Food Insecurity Present (08/13/2023)   Hunger Vital Sign    Worried About Running  Out of Food in the Last Year: Sometimes true    Ran Out of Food in the Last Year: Sometimes true  Transportation Needs: Unmet Transportation Needs (08/13/2023)   PRAPARE - Administrator, Civil Service (Medical): Yes    Lack of Transportation (Non-Medical): Yes  Physical Activity: Not on file  Stress: Not on file  Social Connections: Not on file  Intimate Partner Violence: Not At Risk (06/17/2023)   Humiliation, Afraid, Rape, and Kick questionnaire    Fear of Current or Ex-Partner: No    Emotionally Abused: No    Physically Abused: No    Sexually Abused: No    No family history on file.  No past surgical history on file.  ROS: Review of Systems Negative except as stated above  PHYSICAL EXAM: BP 128/76 (BP Location: Left Arm, Patient Position: Sitting, Cuff Size: Normal)   Pulse 92   Ht 5\' 4"  (1.626 m)   Wt 149 lb (67.6 kg)   SpO2 100%   BMI 25.58 kg/m   Physical Exam   General appearance - alert, well appearing, and in no distress Mental status - normal mood, behavior, speech, dress, motor activity, and thought processes GU Male -CMA Josiah Lobo is present: Patient is uncircumcised.  He has dusky, dry appearance to the distal foreskin that covers the glans penis.  Small cracks of skin distal to the glans penis.  A lot of moisture under the foreskin that overlies the glans penis     Latest Ref Rng & Units 08/20/2023    9:03 PM 07/14/2023    4:09 PM 06/18/2023    7:24 AM  CMP  Glucose 70 - 99 mg/dL 981  191  478   BUN 6 - 20 mg/dL 22  20  11    Creatinine 0.61 - 1.24 mg/dL 2.95  6.21  3.08   Sodium 135 - 145 mmol/L 132  139  138   Potassium 3.5 - 5.1 mmol/L 3.9  4.0  3.3   Chloride 98 - 111 mmol/L 100  101  100   CO2 22 - 32 mmol/L 22  24  25    Calcium 8.9 - 10.3 mg/dL 9.2  9.4  8.6   Total Protein 6.5 - 8.1 g/dL 7.4     Total Bilirubin <1.2 mg/dL 0.9     Alkaline Phos 38 - 126 U/L 157     AST 15 - 41 U/L 31     ALT 0 - 44 U/L 31      Lipid Panel      Component Value Date/Time   CHOL 156 06/17/2023 0415   TRIG 181 (H) 06/17/2023 0415   HDL 43 06/17/2023 0415   CHOLHDL 3.6 06/17/2023 0415   VLDL 36 06/17/2023 0415   LDLCALC 77 06/17/2023 0415    CBC    Component Value  Date/Time   WBC 7.4 08/20/2023 2103   RBC 4.29 08/20/2023 2103   HGB 12.6 (L) 08/20/2023 2103   HCT 35.1 (L) 08/20/2023 2103   PLT 244 08/20/2023 2103   MCV 81.8 08/20/2023 2103   MCH 29.4 08/20/2023 2103   MCHC 35.9 08/20/2023 2103   RDW 11.9 08/20/2023 2103   LYMPHSABS 2.1 07/31/2018 1904   MONOABS 0.5 07/31/2018 1904   EOSABS 0.1 07/31/2018 1904   BASOSABS 0.0 07/31/2018 1904    ASSESSMENT AND PLAN: 1. Balanitis (Primary) Discussed the importance of keeping the foreskin that overlies the glans penis clean and dry. - miconazole (MICATIN) 2 % cream; Apply 1 Application topically 2 (two) times daily.  Dispense: 30 g; Refill: 0    Patient was given the opportunity to ask questions.  Patient verbalized understanding of the plan and was able to repeat key elements of the plan.   This documentation was completed using Paediatric nurse.  Any transcriptional errors are unintentional.  No orders of the defined types were placed in this encounter.    Requested Prescriptions   Signed Prescriptions Disp Refills   miconazole (MICATIN) 2 % cream 30 g 0    Sig: Apply 1 Application topically 2 (two) times daily.    No follow-ups on file.  Jonah Blue, MD, FACP

## 2023-12-05 NOTE — Telephone Encounter (Signed)
 Stool sample collected . Sent to LAB for testing as ordered.

## 2023-12-08 ENCOUNTER — Other Ambulatory Visit: Payer: Self-pay

## 2023-12-08 LAB — TISSUE TRANSGLUTAMINASE, IGA: Transglutaminase IgA: <2 U/mL (ref 0–3)

## 2023-12-10 LAB — CDIFF NAA+O+P+STOOL CULTURE
E coli, Shiga toxin Assay: NEGATIVE
Toxigenic C. Difficile by PCR: NEGATIVE

## 2023-12-10 NOTE — Telephone Encounter (Signed)
 H&V Care Navigation CSW Progress Note  Clinical Social Worker  delivered [facility] Patient [facility] assistance check  to [employer] (land[place]) on 2/6. Receipts sent to clinic team at [facility] and [employer].  Patient is participating in a Managed Medicaid Plan:  no, self pay only  SDOH Screenings   Food Insecurity: Food Insecurity Present (08/13/2023)  Housing: Medium Risk (08/13/2023)  Transportation Needs: Unmet Transportation Needs (08/13/2023)  Utilities: Not At Risk (08/13/2023)  Depression (PHQ2-9): Low Risk  (09/19/2023)  Financial Resource Strain: High Risk (08/13/2023)  Tobacco Use: Low Risk  (12/05/2023)  Health Literacy: Inadequate Health Literacy (08/13/2023)   Octavio Graves, MSW, LCSW Clinical Social Worker II [facility] Heart/Vascular Care Navigation  [phone]- work cell phone (preferred) [phone]- desk phone

## 2023-12-12 ENCOUNTER — Other Ambulatory Visit: Payer: Self-pay

## 2023-12-12 NOTE — Telephone Encounter (Signed)
 Called & spoke to the patient. Verified name & DOB. Informed that [employer] Aid has advised that he goes through the IRS to request an ITIN number. Due to the patient's limitations I informed the patient that the Center for [facility] may be able to assist. Patient agreed to receive their assistance if possible. He stated since he is unable to see or speak english he cannot contact them and will need assistance getting connected. Patient stated that alex, the young man previously helping him has possibly moved out and has not received any assistance of any kind at home.

## 2023-12-16 ENCOUNTER — Other Ambulatory Visit: Payer: Self-pay

## 2023-12-18 NOTE — Telephone Encounter (Signed)
 Called several time on different days but call immediately goes to voicemail. LVM requesting to call back and ask for Erskine Squibb.  Erskine Squibb whenever they call back and you need help interpreting please let me know.

## 2023-12-25 ENCOUNTER — Other Ambulatory Visit: Payer: Self-pay

## 2024-01-05 ENCOUNTER — Ambulatory Visit: Payer: Self-pay | Admitting: Pharmacist

## 2024-01-05 NOTE — Progress Notes (Deleted)
 S:     No chief complaint on file.  48 y.o. male who presents for diabetes evaluation, education, and management. Patient arrives in good spirits and presents without any assistance.   Patient was referred and last seen by Primary Care Provider, Dr. Laural Benes, on 09/19/2023.   PMH is significant for IDDM, diabetic retinopathy with R eye blindness, HTN w/ hx of hypertensive emergency, NSTEMI type II in the setting of demand ischemia secondary to hypertensive urgency in 06/2023.   At last visit with Dr. Laural Benes, we attempted to change him to insulin pens so he can "count clicks" instead of using vial/syringe method. He refused, insisting to stay on the insulin syringes.  Patient reports Diabetes is longstanding. He has a hx of admission for DKA and has been prescribed insulin for a while. However, cost, lack of resources, blindness has all contributed to suboptimal medication adherence. He is unable to check blood sugar at home. He tells me today that he sometimes relies on the people he stays with but does not like to bother them. He has a hx of AKI  but not CKD. Last creatinine in 08/20/23 was nl. No hx of CHF or stroke.  Family/Social History:  Fhx: no known positives Tobacco: never smoker  Alcohol: none reported   Current diabetes medications include: Novolin 70/30 42u BID (has not taken in ~1 month) Current hypertension medications include: amlodipine 5 mg daily, carvedilol 3.125 mg BID, lisinopril 2.5 mg daily (not taking any of these)  Current hyperlipidemia medications include: atorvastatin 20 mg daily (not taking)  Patient reports adherence to taking all medications as prescribed.   Insurance coverage: none  Patient reports hypoglycemic events when he takes the insulin. Will experience diaphoresis and shaking within a couple of hours of his morning dose and overnight.  Reported home fasting blood sugars: not checking  Reported 2 hour post-meal/random blood sugars: not  checking.  Patient reports nocturia (nighttime urination).  Patient reports neuropathy (nerve pain). Patient reports visual changes. Patient denies self foot exams.   Patient reported dietary habits: -Eating habits vary  -Some days, he skips meals as he cannot prepare his own meals and relies on those he lives with   Patient-reported exercise habits: none    O:  No GM present. No CGM in place.  Lab Results  Component Value Date   HGBA1C 11.3 (A) 09/19/2023   There were no vitals filed for this visit.  Lipid Panel     Component Value Date/Time   CHOL 156 06/17/2023 0415   TRIG 181 (H) 06/17/2023 0415   HDL 43 06/17/2023 0415   CHOLHDL 3.6 06/17/2023 0415   VLDL 36 06/17/2023 0415   LDLCALC 77 06/17/2023 0415   Clinical Atherosclerotic Cardiovascular Disease (ASCVD): No  The ASCVD Risk score (Arnett DK, et al., 2019) failed to calculate for the following reasons:   Risk score cannot be calculated because patient has a medical history suggesting prior/existing ASCVD   Patient is participating in a Managed Medicaid Plan: No   A/P: Diabetes longstanding currently uncontrolled. Patient is symptomatic from a hyperglycemic standpoint. He is not having any current hypoglycemia but endorses almost daily hypoglycemia when he takes insulin. He is unable to verify his CBG level when this happens but is able to verbalize appropriate hypoglycemia management plan. Medication adherence is not optimal, and we have a lot of barriers here. I spent an hour with him for insulin pen counseling. After discussion, demonstration, and successful teach back, Mr. Keesey  was able to demonstrate proper injection of Basaglar. I have no way to verify the dose he was using of his 70/30. He tells me today he would draw insulin into a syringe until the plunger was "4 fingers away" from the base of the syringe. With his daily hypoglycemia, there is no telling what this dose actually was. Given that, he is  finally willing to change to the pen and we will be conservative here and dose 10u in the morning. I have also set him up with auto-refill and home delivery with the pharmacy. -Discontinued 70/30. -Start Basaglar 10u daily. I recognize he needs more than this but we have to be careful here with his history. I'd rather him have some coverage and [place] titrate gradually than cause symptomatic hypoglycemia. -Patient educated on purpose, proper use, and potential adverse effects of Basaglar -Injection technique counseled. Given instructions on counting 10 "clicks" and attaching new pen needles. Patient was able to demonstrate use after extensive counseling.  -Extensively discussed pathophysiology of diabetes, recommended lifestyle interventions, dietary effects on blood sugar control.  -Counseled on s/sx of and management of hypoglycemia.  -Next A1c anticipated 11/2023.   Written patient instructions provided. Patient verbalized understanding of treatment plan.  Total time in face to face counseling 60 minutes.    Follow-up:  Pharmacist in 3-4 weeks  Butch Penny, PharmD, [place], CPP Clinical Pharmacist [facility] [phone]

## 2024-01-19 ENCOUNTER — Other Ambulatory Visit: Payer: Self-pay

## 2024-01-19 ENCOUNTER — Other Ambulatory Visit: Payer: Self-pay | Admitting: Internal Medicine

## 2024-01-19 DIAGNOSIS — I152 Hypertension secondary to endocrine disorders: Secondary | ICD-10-CM

## 2024-01-19 DIAGNOSIS — E1069 Type 1 diabetes mellitus with other specified complication: Secondary | ICD-10-CM

## 2024-01-19 MED ORDER — AMLODIPINE BESYLATE 5 MG PO TABS
5.0000 mg | ORAL_TABLET | Freq: Every day | ORAL | 1 refills | Status: DC
  Filled 2024-01-19: qty 90, 90d supply, fill #0
  Filled 2024-04-19: qty 90, 90d supply, fill #1

## 2024-01-19 MED ORDER — ATORVASTATIN CALCIUM 20 MG PO TABS
20.0000 mg | ORAL_TABLET | Freq: Every day | ORAL | 1 refills | Status: DC
  Filled 2024-01-19: qty 90, 90d supply, fill #0
  Filled 2024-04-19: qty 90, 90d supply, fill #1

## 2024-01-26 ENCOUNTER — Other Ambulatory Visit: Payer: Self-pay

## 2024-01-26 ENCOUNTER — Telehealth: Payer: Self-pay

## 2024-01-26 NOTE — Telephone Encounter (Signed)
 Message received from Daniela Cohrs, PAA stating that she tried to reach the patient 3 times and call would not go through. Drew [name], who is Spanish speaking, then called the patient's  brother, Drew [name], and who said he has not spoken to the patient in over 2 months.   I called [employer] non emergent line, spoke to [place] and requested a welfare check. I asked that the officer who goes to the home call me back with a report and if the patient is there, I can ask Drew [name] to speak with him about the appointment he has scheduled for tomorrow with PCP at [facility].     Call received from Drew [name], paramedic.  She was with the patient at his home and said he is doing all right.  Drew [name] then spoke to the patient and discussed to remind him of the appointment tomorrow and to schedule a ride for him to the clinic.

## 2024-01-26 NOTE — Telephone Encounter (Signed)
 Called patient in the morning the call couldn't connect. Tried 2 more times in the afternoon and call couldn't be completed. Drew Carson RN, placed a welfare check for the patient and paramedics called shortly after with the patient on the phone. I asked him if he has been okay and he expressed he's had some difficulty with food and his phone is cut off at the moment. I advised the patient that he has an appointment with the clinical pharmacist tomorrow morning and I will set up transportation for him to get here. Also asked him if he has been taking his medications and checking his blood sugar he said he hasn't and that he just got medications delivered but he hasn't opened them because he doesn't know which is which, advised patient that he should bring them all to his appointment tomorrow and Drew [name] can go over them with him, I asked him if there was anything else I could assist him with he said no and was grateful for us  calling to check up on him.

## 2024-01-27 ENCOUNTER — Encounter: Payer: Self-pay | Admitting: Internal Medicine

## 2024-01-27 ENCOUNTER — Other Ambulatory Visit: Payer: Self-pay

## 2024-01-27 ENCOUNTER — Ambulatory Visit: Payer: MEDICAID | Attending: Internal Medicine | Admitting: Internal Medicine

## 2024-01-27 ENCOUNTER — Telehealth: Payer: Self-pay

## 2024-01-27 VITALS — BP 125/77 | HR 88 | Ht 64.0 in | Wt 140.0 lb

## 2024-01-27 DIAGNOSIS — E119 Type 2 diabetes mellitus without complications: Secondary | ICD-10-CM

## 2024-01-27 DIAGNOSIS — I152 Hypertension secondary to endocrine disorders: Secondary | ICD-10-CM

## 2024-01-27 DIAGNOSIS — Z658 Other specified problems related to psychosocial circumstances: Secondary | ICD-10-CM

## 2024-01-27 DIAGNOSIS — Z7984 Long term (current) use of oral hypoglycemic drugs: Secondary | ICD-10-CM

## 2024-01-27 DIAGNOSIS — Z794 Long term (current) use of insulin: Secondary | ICD-10-CM

## 2024-01-27 DIAGNOSIS — E1142 Type 2 diabetes mellitus with diabetic polyneuropathy: Secondary | ICD-10-CM

## 2024-01-27 DIAGNOSIS — K6289 Other specified diseases of anus and rectum: Secondary | ICD-10-CM

## 2024-01-27 DIAGNOSIS — E113593 Type 2 diabetes mellitus with proliferative diabetic retinopathy without macular edema, bilateral: Secondary | ICD-10-CM

## 2024-01-27 DIAGNOSIS — I10 Essential (primary) hypertension: Secondary | ICD-10-CM

## 2024-01-27 LAB — POCT GLYCOSYLATED HEMOGLOBIN (HGB A1C): HbA1c POC (<> result, manual entry): >15 % (ref 4.0–5.6)

## 2024-01-27 LAB — GLUCOSE, POCT (MANUAL RESULT ENTRY): POC Glucose: 226 mg/dL — AB (ref 70–99)

## 2024-01-27 MED ORDER — CARVEDILOL 3.125 MG PO TABS
3.1250 mg | ORAL_TABLET | Freq: Two times a day (BID) | ORAL | 2 refills | Status: DC
  Filled 2024-01-27 – 2024-04-19 (×2): qty 180, 90d supply, fill #0
  Filled 2024-07-16 (×2): qty 180, 90d supply, fill #1
  Filled 2024-10-12: qty 60, 30d supply, fill #2
  Filled 2024-10-12: qty 180, 90d supply, fill #2

## 2024-01-27 MED ORDER — BASAGLAR KWIKPEN 100 UNIT/ML ~~LOC~~ SOPN
20.0000 [IU] | PEN_INJECTOR | Freq: Two times a day (BID) | SUBCUTANEOUS | 1 refills | Status: DC
  Filled 2024-01-27 (×2): qty 12, 30d supply, fill #0
  Filled 2024-02-19: qty 12, 30d supply, fill #1
  Filled 2024-03-18: qty 12, 30d supply, fill #2

## 2024-01-27 MED ORDER — GABAPENTIN 300 MG PO CAPS
300.0000 mg | ORAL_CAPSULE | Freq: Every day | ORAL | 3 refills | Status: DC
  Filled 2024-01-27: qty 90, 90d supply, fill #0
  Filled 2024-04-19: qty 90, 90d supply, fill #1

## 2024-01-27 MED ORDER — LISINOPRIL 2.5 MG PO TABS
2.5000 mg | ORAL_TABLET | Freq: Every day | ORAL | 1 refills | Status: DC
  Filled 2024-01-27 – 2024-04-19 (×2): qty 90, 90d supply, fill #0

## 2024-01-27 MED ORDER — METFORMIN HCL ER 500 MG PO TB24
500.0000 mg | ORAL_TABLET | Freq: Every day | ORAL | 1 refills | Status: DC
  Filled 2024-01-27 – 2024-02-16 (×2): qty 90, 90d supply, fill #0

## 2024-01-27 NOTE — Progress Notes (Unsigned)
 Patient ID: Drew [name], male    DOB: 10-Feb-1976  MRN: [id number]  CC: Medical Management of Chronic Issues (Numbness in arms and legs/Pain when using the bathroom)   Subjective: Drew [name] is a 48 y.o. male who presents for chronic ds management. 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 #[id number], Dalia  DM: Results for orders placed or performed in visit on 01/27/24  POCT glucose (manual entry)   Collection Time: 01/27/24 10:46 AM  Result Value Ref Range   POC Glucose 226 (A) 70 - 99 mg/dl  POCT glycosylated hemoglobin (Hb A1C)   Collection Time: 01/27/24 10:51 AM  Result Value Ref Range   Hemoglobin A1C     HbA1c POC (<> result, manual entry) >15.0 4.0 - 5.6 %   HbA1c, POC (prediabetic range)     HbA1c, POC (controlled diabetic range)    Should be on Basaglar  Pen 20 units daily. He tells me has been taking 20 units BID.  Reports he ran out 2 wk ago.  He does have RF on med.  Limited due to vision. Has all med bottles with him today except Metformin .  Reports he does have Metformin  at home; has 5 pills left of it. C/o of feeling cold all the time Reports numbness in legs from knees down and in arms x 1.5 mths.  Use to be only in feet but now in legs and arms Continues to have limited to no social support. Owes $1000 for rent.  Given a # to call for support but can not see the #s. He is   C/o pain around rectum when having BM. No constipation Not sure of blood in stool   Patient Active Problem List   Diagnosis Date Noted   Hypertension associated with type 1 diabetes mellitus (HCC) 07/14/2023   Hyperlipidemia due to type 1 diabetes mellitus (HCC) 07/14/2023   NSTEMI (non-ST elevated myocardial infarction) (HCC) 06/17/2023   Hypertensive urgency 06/17/2023   Hypertensive  emergency 06/17/2023   Hyperglycemic crisis due to diabetes mellitus (HCC) 06/17/2023   Headache 06/17/2023   Diabetic retinopathy (HCC) 06/17/2023   Blindness of right eye 06/17/2023   Insulin  dependent type 1 diabetes mellitus (HCC) 06/17/2023   AKI (acute kidney injury) (HCC) 06/17/2023   DKA (diabetic ketoacidosis) (HCC) 02/14/2014   Nausea with vomiting 02/14/2014     Current Outpatient Medications on File Prior to Visit  Medication Sig Dispense Refill   amLODipine  (NORVASC ) 5 MG tablet Take 1 tablet (5 mg total) by mouth daily. 90 tablet 1   atorvastatin  (LIPITOR) 20 MG tablet Take 1 tablet (20 mg total) by mouth daily. 90 tablet 1   carvedilol  (COREG ) 3.125 MG tablet Take 1 tablet (3.125 mg total) by mouth 2 (two) times daily with a meal. 180 tablet 2   Insulin  Glargine (BASAGLAR  KWIKPEN) 100 UNIT/ML Inject 20 Units into the skin daily. 18 mL 1   lisinopril  (ZESTRIL ) 2.5 MG tablet Take 1 tablet (2.5 mg total) by mouth daily. Pt request mail delivery 90 tablet 1   loperamide  (IMODIUM  A-D) 2 MG tablet Take 1 tablet (2 mg total) by mouth 3 (three) times daily as needed for diarrhea or loose stools. 24 tablet 0   metFORMIN  (GLUCOPHAGE -XR) 500 MG 24 hr tablet Take 1 tablet (500 mg total) by mouth  daily with breakfast. 90 tablet 1   miconazole  (MICATIN) 2 % cream Apply 1 Application topically 2 (two) times daily. 30 g 0   Blood Glucose Monitoring Suppl (BLOOD GLUCOSE MONITOR SYSTEM) w/Device KIT Use 3 (three) times daily. (Patient not taking: Reported on 11/21/2023) 1 kit 0   Glucose Blood (BLOOD GLUCOSE TEST STRIPS) STRP Use 3 (three) times daily as directed to check blood sugar. (Patient not taking: Reported on 11/21/2023) 100 strip 0   Insulin  Pen Needle (PEN NEEDLES) 32G X 4 MM MISC Use to inject Basaglar  once daily. (Patient not taking: Reported on 11/21/2023) 100 each 3   Insulin  Syringe-Needle U-100 (TRUEPLUS INSULIN  SYRINGE) 31G X 5/16" 0.5 ML MISC Use to inject 70/30 insulin  twice  daily. (Patient not taking: Reported on 11/21/2023) 100 each 6   No current facility-administered medications on file prior to visit.    No Known Allergies  Social History   Socioeconomic History   Marital status: Single    Spouse name: Not on file   Number of children: Not on file   Years of education: Not on file   Highest education level: Not on file  Occupational History   Not on file  Tobacco Use   Smoking status: Never   Smokeless tobacco: Never  Vaping Use   Vaping status: Never Used  Substance and Sexual Activity   Alcohol use: No   Drug use: No   Sexual activity: Not on file  Other Topics Concern   Not on file  Social History Narrative   Not on file   Social Drivers of Health   Financial Resource Strain: High Risk (08/13/2023)   Overall Financial Resource Strain (CARDIA)    Difficulty of Paying Living Expenses: Hard  Food Insecurity: Food Insecurity Present (08/13/2023)   Hunger Vital Sign    Worried About Running Out of Food in the Last Year: Sometimes true    Ran Out of Food in the Last Year: Sometimes true  Transportation Needs: Unmet Transportation Needs (08/13/2023)   PRAPARE - Administrator, Civil Service (Medical): Yes    Lack of Transportation (Non-Medical): Yes  Physical Activity: Not on file  Stress: Not on file  Social Connections: Not on file  Intimate Partner Violence: Not At Risk (06/17/2023)   Humiliation, Afraid, Rape, and Kick questionnaire    Fear of Current or Ex-Partner: No    Emotionally Abused: No    Physically Abused: No    Sexually Abused: No    No family history on file.  No past surgical history on file.  ROS: Review of Systems Negative except as stated above  PHYSICAL EXAM: BP 125/77   Pulse 88   Ht 5\' 4"  (1.626 m)   Wt 140 lb (63.5 kg)   SpO2 99%   BMI 24.03 kg/m   Physical Exam   {male adult master:[id number]}     Latest Ref Rng & Units 08/20/2023    9:03 PM 07/14/2023    4:09 PM 06/18/2023     7:24 AM  CMP  Glucose 70 - 99 mg/dL 409  811  914   BUN 6 - 20 mg/dL 22  20  11    Creatinine 0.61 - 1.24 mg/dL 7.82  9.56  2.13   Sodium 135 - 145 mmol/L 132  139  138   Potassium 3.5 - 5.1 mmol/L 3.9  4.0  3.3   Chloride 98 - 111 mmol/L 100  101  100   CO2 22 - 32 mmol/L  22  24  25    Calcium  8.9 - 10.3 mg/dL 9.2  9.4  8.6   Total Protein 6.5 - 8.1 g/dL 7.4     Total Bilirubin <1.2 mg/dL 0.9     Alkaline Phos 38 - 126 U/L 157     AST 15 - 41 U/L 31     ALT 0 - 44 U/L 31      Lipid Panel     Component Value Date/Time   CHOL 156 06/17/2023 0415   TRIG 181 (H) 06/17/2023 0415   HDL 43 06/17/2023 0415   CHOLHDL 3.6 06/17/2023 0415   VLDL 36 06/17/2023 0415   LDLCALC 77 06/17/2023 0415    CBC    Component Value Date/Time   WBC 7.4 08/20/2023 2103   RBC 4.29 08/20/2023 2103   HGB 12.6 (L) 08/20/2023 2103   HCT 35.1 (L) 08/20/2023 2103   PLT 244 08/20/2023 2103   MCV 81.8 08/20/2023 2103   MCH 29.4 08/20/2023 2103   MCHC 35.9 08/20/2023 2103   RDW 11.9 08/20/2023 2103   LYMPHSABS 2.1 07/31/2018 1904   MONOABS 0.5 07/31/2018 1904   EOSABS 0.1 07/31/2018 1904   BASOSABS 0.0 07/31/2018 1904    ASSESSMENT AND PLAN:  Assessment and Plan Assessment & Plan      1. 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  Patient verbalized understanding of the plan and was able to repeat key elements of the plan.   This documentation was completed using Paediatric nurse.  Any transcriptional errors are unintentional.  Orders Placed This Encounter  Procedures   POCT glycosylated hemoglobin (Hb A1C)   POCT glucose (manual entry)     Requested Prescriptions    No  prescriptions requested or ordered in this encounter    No follow-ups on file.  Concetta Dee, MD, FACP
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
## 2024-01-28 ENCOUNTER — Encounter: Payer: Self-pay | Admitting: Internal Medicine

## 2024-01-28 NOTE — Telephone Encounter (Signed)
 SECURE email sent to [facility]; Domenick Friedlander- [employer] and Kristina Mayer- [employer] inquiring if they are able to provide any assistance/ offer resources to patient. I tried to reach Center for [employer]: [phone] but the recording stated that the call cannot be completed at this time

## 2024-01-29 NOTE — Telephone Encounter (Signed)
 I tried to reach Center for [facility]: [phone] again; but the recording stated that the call cannot be completed at this time

## 2024-02-04 NOTE — Telephone Encounter (Signed)
 Message received from [facility] that the patient has been certified for [facility] [place]

## 2024-02-16 ENCOUNTER — Other Ambulatory Visit: Payer: Self-pay

## 2024-02-19 ENCOUNTER — Other Ambulatory Visit: Payer: Self-pay

## 2024-02-27 ENCOUNTER — Telehealth: Payer: Self-pay | Admitting: Internal Medicine

## 2024-02-27 ENCOUNTER — Ambulatory Visit: Payer: Self-pay | Admitting: Pharmacist

## 2024-02-27 NOTE — Telephone Encounter (Signed)
 Transportation Voucher completed.   Pick up date and time: 02/27/2024 12:00 pm. Pick up address: [address] [place], [address]    Drop off address: [address] [place] [address]

## 2024-02-27 NOTE — Telephone Encounter (Signed)
 Transportation Voucher completed.    Pick up date and time: 03/01/2024 9:20 am Pick up address: [address] [place] [address]     Drop off address: [address] [place], [address]

## 2024-03-01 ENCOUNTER — Encounter: Payer: Self-pay | Admitting: Family Medicine

## 2024-03-01 ENCOUNTER — Telehealth: Payer: Self-pay | Admitting: Internal Medicine

## 2024-03-01 ENCOUNTER — Ambulatory Visit: Payer: Self-pay | Attending: Family Medicine | Admitting: Family Medicine

## 2024-03-01 ENCOUNTER — Encounter: Payer: Self-pay | Admitting: Pharmacist

## 2024-03-01 ENCOUNTER — Ambulatory Visit: Payer: Self-pay | Attending: Internal Medicine | Admitting: Pharmacist

## 2024-03-01 ENCOUNTER — Other Ambulatory Visit: Payer: Self-pay

## 2024-03-01 VITALS — BP 130/79 | HR 106 | Ht 64.0 in | Wt 145.8 lb

## 2024-03-01 DIAGNOSIS — Z794 Long term (current) use of insulin: Secondary | ICD-10-CM

## 2024-03-01 DIAGNOSIS — Z7984 Long term (current) use of oral hypoglycemic drugs: Secondary | ICD-10-CM

## 2024-03-01 DIAGNOSIS — E113593 Type 2 diabetes mellitus with proliferative diabetic retinopathy without macular edema, bilateral: Secondary | ICD-10-CM

## 2024-03-01 DIAGNOSIS — L0231 Cutaneous abscess of buttock: Secondary | ICD-10-CM

## 2024-03-01 DIAGNOSIS — Z5982 Transportation insecurity: Secondary | ICD-10-CM

## 2024-03-01 MED ORDER — IBUPROFEN 600 MG PO TABS
600.0000 mg | ORAL_TABLET | Freq: Three times a day (TID) | ORAL | 0 refills | Status: DC | PRN
  Filled 2024-03-01: qty 30, 10d supply, fill #0

## 2024-03-01 MED ORDER — CEPHALEXIN 500 MG PO CAPS
500.0000 mg | ORAL_CAPSULE | Freq: Two times a day (BID) | ORAL | 0 refills | Status: DC
  Filled 2024-03-01: qty 14, 7d supply, fill #0

## 2024-03-01 MED ORDER — ACETAMINOPHEN-CODEINE 300-30 MG PO TABS
1.0000 | ORAL_TABLET | Freq: Two times a day (BID) | ORAL | 0 refills | Status: DC | PRN
  Filled 2024-03-01: qty 10, 5d supply, fill #0

## 2024-03-01 NOTE — Progress Notes (Addendum)
 Subjective:  Patient ID: Drew [name], male    DOB: 01-29-1976  Age: 48 y.o. MRN: [id number]  CC: Recurrent Skin Infections     Discussed the use of AI scribe software for clinical note transcription with the patient, who gave verbal consent to proceed.  History of Present Illness Drew [name] is a 48 year old male patient of Dr. Lincoln Renshaw with a history of type 2 diabetes mellitus, bilateral visual impairment who presents with recurring boils on his buttocks.  He has a current boil on the left buttock that has burst, causing significant pain and difficulty with sitting and daily activities. He has experienced similar issues in the past, which were treated with incision and drainage, but the condition persists. He feels feverish, and is wearing a jacket for warmth. He uses hot water to alleviate discomfort. He is blind in both eyes, which affects his ability to navigate independently and complicates his desire to start working.    Past Medical History:  Diagnosis Date   Diabetes mellitus without complication (HCC)     History reviewed. No pertinent surgical history.  History reviewed. No pertinent family history.  Social History   Socioeconomic History   Marital status: Single    Spouse name: Not on file   Number of children: Not on file   Years of education: Not on file   Highest education level: Not on file  Occupational History   Not on file  Tobacco Use   Smoking status: Never   Smokeless tobacco: Never  Vaping Use   Vaping status: Never Used  Substance and Sexual Activity   Alcohol use: No   Drug use: No   Sexual activity: Not on file  Other Topics Concern   Not on file  Social History Narrative   Not on file   Social Drivers of Health   Financial Resource Strain: High Risk (01/28/2024)   Overall Financial Resource Strain (CARDIA)    Difficulty of Paying Living Expenses: Very hard  Food Insecurity: Food Insecurity Present (01/28/2024)   Hunger  Vital Sign    Worried About Running Out of Food in the Last Year: Often true    Ran Out of Food in the Last Year: Often true  Transportation Needs: Unmet Transportation Needs (01/28/2024)   PRAPARE - Administrator, Civil Service (Medical): Yes    Lack of Transportation (Non-Medical): Yes  Physical Activity: Not on file  Stress: Not on file  Social Connections: Not on file    No Known Allergies  Outpatient Medications Prior to Visit  Medication Sig Dispense Refill   amLODipine  (NORVASC ) 5 MG tablet Take 1 tablet (5 mg total) by mouth daily. 90 tablet 1   atorvastatin  (LIPITOR) 20 MG tablet Take 1 tablet (20 mg total) by mouth daily. 90 tablet 1   Blood Glucose Monitoring Suppl (BLOOD GLUCOSE MONITOR SYSTEM) w/Device KIT Use 3 (three) times daily. 1 kit 0   carvedilol  (COREG ) 3.125 MG tablet Take 1 tablet (3.125 mg total) by mouth 2 (two) times daily with a meal. 180 tablet 2   gabapentin  (NEURONTIN ) 300 MG capsule Take 1 capsule (300 mg total) by mouth at bedtime. 90 capsule 3   Glucose Blood (BLOOD GLUCOSE TEST STRIPS) STRP Use 3 (three) times daily as directed to check blood sugar. 100 strip 0   Insulin  Glargine (BASAGLAR  KWIKPEN) 100 UNIT/ML Inject 20 Units into the skin 2 (two) times daily. 36 mL 1   Insulin  Pen Needle (PEN NEEDLES) 32G X  4 MM MISC Use to inject Basaglar  once daily. 100 each 3   Insulin  Syringe-Needle U-100 (TRUEPLUS INSULIN  SYRINGE) 31G X 5/16" 0.5 ML MISC Use to inject 70/30 insulin  twice daily. 100 each 6   lisinopril  (ZESTRIL ) 2.5 MG tablet Take 1 tablet (2.5 mg total) by mouth daily. 90 tablet 1   loperamide  (IMODIUM  A-D) 2 MG tablet Take 1 tablet (2 mg total) by mouth 3 (three) times daily as needed for diarrhea or loose stools. 24 tablet 0   metFORMIN  (GLUCOPHAGE -XR) 500 MG 24 hr tablet Take 1 tablet (500 mg total) by mouth daily with breakfast. 90 tablet 1   miconazole  (MICATIN) 2 % cream Apply 1 Application topically 2 (two) times daily. 30 g 0    No facility-administered medications prior to visit.     ROS Review of Systems  Constitutional:  Negative for activity change and appetite change.  HENT:  Negative for sinus pressure and sore throat.   Eyes:  Positive for visual disturbance.  Respiratory:  Negative for chest tightness, shortness of breath and wheezing.   Cardiovascular:  Negative for chest pain and palpitations.  Gastrointestinal:  Negative for abdominal distention, abdominal pain and constipation.  Genitourinary: Negative.   Musculoskeletal: Negative.   Skin:  Positive for rash.  Psychiatric/Behavioral:  Negative for behavioral problems and dysphoric mood.     Objective:  BP 130/79   Pulse (!) 106   Ht 5\' 4"  (1.626 m)   Wt 145 lb 12.8 oz (66.1 kg)   SpO2 97%   BMI 25.03 kg/m      03/01/2024    3:29 PM 01/27/2024   10:40 AM 12/05/2023    3:42 PM  BP/Weight  Systolic BP 130 125 128  Diastolic BP 79 77 76  Wt. (Lbs) 145.8 140 149  BMI 25.03 kg/m2 24.03 kg/m2 25.58 kg/m2      Physical Exam Constitutional:      Appearance: He is well-developed.  Eyes:     Comments: Visually impaired in both eyes  Cardiovascular:     Rate and Rhythm: Tachycardia present.     Heart sounds: Normal heart sounds. No murmur heard. Pulmonary:     Effort: Pulmonary effort is normal.     Breath sounds: Normal breath sounds. No wheezing or rales.  Chest:     Chest wall: No tenderness.  Abdominal:     General: Bowel sounds are normal. There is no distension.     Palpations: Abdomen is soft. There is no mass.     Tenderness: There is no abdominal tenderness.  Musculoskeletal:        General: Normal range of motion.     Right lower leg: No edema.     Left lower leg: No edema.  Skin:    Comments: Inferior mid aspect of left gluteal muscle with abscess, bloody drainage but no pus, tender to palpation  Neurological:     Mental Status: He is alert and oriented to person, place, and time.  Psychiatric:        Mood and  Affect: Mood normal.        Latest Ref Rng & Units 08/20/2023    9:03 PM 07/14/2023    4:09 PM 06/18/2023    7:24 AM  CMP  Glucose 70 - 99 mg/dL 161  096  045   BUN 6 - 20 mg/dL 22  20  11    Creatinine 0.61 - 1.24 mg/dL 4.09  8.11  9.14   Sodium 135 - 145 mmol/L  132  139  138   Potassium 3.5 - 5.1 mmol/L 3.9  4.0  3.3   Chloride 98 - 111 mmol/L 100  101  100   CO2 22 - 32 mmol/L 22  24  25    Calcium  8.9 - 10.3 mg/dL 9.2  9.4  8.6   Total Protein 6.5 - 8.1 g/dL 7.4     Total Bilirubin <1.2 mg/dL 0.9     Alkaline Phos 38 - 126 U/L 157     AST 15 - 41 U/L 31     ALT 0 - 44 U/L 31       Lipid Panel     Component Value Date/Time   CHOL 156 06/17/2023 0415   TRIG 181 (H) 06/17/2023 0415   HDL 43 06/17/2023 0415   CHOLHDL 3.6 06/17/2023 0415   VLDL 36 06/17/2023 0415   LDLCALC 77 06/17/2023 0415    CBC    Component Value Date/Time   WBC 7.4 08/20/2023 2103   RBC 4.29 08/20/2023 2103   HGB 12.6 (L) 08/20/2023 2103   HCT 35.1 (L) 08/20/2023 2103   PLT 244 08/20/2023 2103   MCV 81.8 08/20/2023 2103   MCH 29.4 08/20/2023 2103   MCHC 35.9 08/20/2023 2103   RDW 11.9 08/20/2023 2103   LYMPHSABS 2.1 07/31/2018 1904   MONOABS 0.5 07/31/2018 1904   EOSABS 0.1 07/31/2018 1904   BASOSABS 0.0 07/31/2018 1904    Lab Results  Component Value Date   HGBA1C >15.0 01/27/2024      1. Gluteal abscess (Primary) Attempted some drainage of abscess and dressing applied Advised to apply warm compress Poor glycemic control contributing to recurrence - cephALEXin (KEFLEX) 500 MG capsule; Take 1 capsule (500 mg total) by mouth 2 (two) times daily.  Dispense: 14 capsule; Refill: 0 - ibuprofen (ADVIL) 600 MG tablet; Take 1 tablet (600 mg total) by mouth every 8 (eight) hours as needed.  Dispense: 30 tablet; Refill: 0   Meds ordered this encounter  Medications   cephALEXin (KEFLEX) 500 MG capsule    Sig: Take 1 capsule (500 mg total) by mouth 2 (two) times daily.    Dispense:  14  capsule    Refill:  0   DISCONTD: acetaminophen -codeine (TYLENOL  #3) 300-30 MG tablet    Sig: Take 1 tablet by mouth every 12 (twelve) hours as needed for moderate pain (pain score 4-6) or severe pain (pain score 7-10).    Dispense:  10 tablet    Refill:  0   DISCONTD: ibuprofen (ADVIL) 600 MG tablet    Sig: Take 1 tablet (600 mg total) by mouth every 8 (eight) hours as needed.    Dispense:  30 tablet    Refill:  0   ibuprofen (ADVIL) 600 MG tablet    Sig: Take 1 tablet (600 mg total) by mouth every 8 (eight) hours as needed.    Dispense:  30 tablet    Refill:  0    Discontinue Tylenol  3    Follow-up: Return for previously scheduled appointment.       Joaquin Mulberry, MD, FAAFP. [facility] [place], [place] [phone]   03/01/2024, 5:06 PM

## 2024-03-01 NOTE — Patient Instructions (Signed)
 Absceso en la piel Skin Abscess  Un absceso en la piel es una zona infectada sobre la piel o debajo de ella. Contiene pus y otras sustancias. Al absceso tambin se Paramedic fornculo, furnculo o divieso. Generalmente la causa es una infeccin provocada por bacterias. Un absceso puede presentarse en casi cualquier parte del cuerpo. A veces, un absceso puede abrirse (explotar) por s solo. En la [employer], seguir empeorando a menos que se lo trate. Un absceso puede provocar dolor y hacer que se sienta mal. Un absceso no tratado puede hacer que la infeccin se propague a otras partes del cuerpo o al torrente sanguneo. Tal vez sea necesario drenar el absceso. Tambin es posible que tenga que tomar antibiticos. Cules son las causas? Un absceso se produce cuando los grmenes, como las bacterias, pasan a travs de la piel y causan infeccin. Las causas de esto pueden ser las siguientes: Un rasguo o un corte en la piel. Una herida por puncin a travs de la piel, como una inyeccin con aguja o la picadura de un insecto. Obstruccin de las glndulas sebceas o sudorparas. Obstruccin e infeccin de folculos pilosos. Un saco lleno de lquido que se forma debajo de la piel (quiste sebceo) y se infecta. Qu incrementa el riesgo? Tiene una mayor probabilidad de que se le forme un absceso si: Tiene problemas de circulacin sangunea o tiene el sistema de defensas del cuerpo (sistema inmunitario) debilitado. Tiene diabetes. Tiene piel seca e irritada. Suele recibir inyecciones o usa  frmacos intravenosos. Tiene un cuerpo extrao en una herida; Drew [name]. Fuma o consume productos que contienen tabaco. Cules son los signos o sntomas? Los sntomas de esta afeccin incluyen: Una protuberancia dolorosa y firme debajo de la piel. Una protuberancia con pus en la parte superior. El pus puede romper la piel y Film/video editor exterior. Otros sntomas pueden incluir los  siguientes: Hinchazn y enrojecimiento alrededor del absceso. Calor o sensibilidad. Hinchazn de los ganglios linfticos (glndulas) cerca del absceso. Una lcera en la piel. Cmo se diagnostica? Esta afeccin se puede diagnosticar en funcin de sus antecedentes mdicos y de un examen fsico. Tambin es posible que le hagan algunos estudios, por ejemplo: Un anlisis de [place] de pus. Este puede hacerse para averiguar qu est causando la infeccin. Anlisis de [place]. Estudios de diagnstico por imgenes, como ecografa, exploracin por tomografa computarizada (TC) o Health visitor (RM). Cmo se trata? Un absceso pequeo que drena por s solo puede no Network engineer. El tratamiento de los abscesos ms grandes puede incluir lo siguiente: Aplicacin de calor hmedo o de una compresa de calor en la zona algunas veces por da. Incisin y drenaje. Este es un procedimiento para Warden/ranger. Antibiticos. Para un absceso grave, puede recibir primero antibiticos a travs de una va intravenosa y luego cambiar a los antibiticos por boca. Siga estas indicaciones en su casa: Medicamentos Use los medicamentos de venta libre y los recetados solo como se lo haya indicado el mdico. Si le recetaron antibiticos, selos como se lo haya indicado el mdico. No deje de usar el antibitico aunque comience a Actor. Cuidado del absceso  Si usted tiene un absceso que no ha supurado, aplique calor en la zona afectada. Use la fuente de calor que el mdico le recomiende, como una compresa de calor hmedo o una almohadilla trmica. Coloque una toalla entre la piel y la fuente de calor. Aplique el calor durante 20 a 30 minutos en cada aplicacin.  Si la piel se le pone de color rojo brillante, retire Company secretary de inmediato para evitar quemaduras. El riesgo de quemaduras es mayor si no puede sentir el dolor, el calor o el fro. Siga las instrucciones del mdico acerca del cuidado del  absceso. Asegrese de hacer lo siguiente: Cubra el absceso con una venda (vendaje). Lvese las manos con agua y jabn durante al menos 20 segundos antes y despus de Multimedia programmer el vendaje o la gasa. Use desinfectante para manos si no dispone de [place] y [place]. Cambie el vendaje o la gasa como se lo haya indicado el mdico. Controle el absceso todos los das para observar si hay signos de una infeccin que [place]. Preste atencin a los siguientes signos: Aumento del enrojecimiento, la hinchazn, Chief Technology Officer o el dolor con la palpacin. Ms lquido Eugena Herter. Calor. Ms pus o peor olor. Indicaciones generales Para evitar la propagacin de la infeccin: No comparta artculos de cuidado personal, toallas ni jacuzzis con [employer]. Evite el contacto piel con piel con otras personas. Tenga cuidado al [facility] vendajes usados, el taponamiento de la herida o cualquier drenaje del absceso. No consuma ningn producto que contenga nicotina o tabaco. Estos productos incluyen cigarrillos, tabaco para Theatre manager y aparatos de vapeo, como los cigarrillos electrnicos. Si necesita ayuda para dejar de consumir estos productos, consulte al mdico. No use cremas, pomadas ni lquidos a menos que se lo haya indicado el mdico. Comunquese con un mdico si: Observa enrojecimiento que se disemina rpidamente o lneas rojas en la piel que se extienden desde el absceso. Tiene signos de que la infeccin empeora en el absceso. Vomita cada vez que come o bebe. Tiene fiebre, escalofros o dolores musculares. Reaparicin del quiste o el absceso. Solicite ayuda de inmediato si: Siente dolor intenso. Hace menos pis (orina) de lo normal. Esta informacin no tiene como fin reemplazar el consejo del mdico. Asegrese de hacerle al mdico cualquier pregunta que tenga. Document Revised: 06/04/2022 Document Reviewed: 06/04/2022 Elsevier Patient Education  2024 [employer].

## 2024-03-01 NOTE — Telephone Encounter (Signed)
 FYI Copied from CRM [id number]. Topic: General - Other >> Mar 01, 2024  9:28 AM Essie A wrote:  Reason for CRM: [facility] called to let the office know that patient isn't answering the phone or the door.  They are trying to get him to his appt.

## 2024-03-01 NOTE — Progress Notes (Signed)
 S:     No chief complaint on file.  48 y.o. male who presents for diabetes evaluation, education, and management. Patient arrives in good spirits and presents without any assistance.   Patient was referred and last seen by Primary Care Provider, Dr. Lincoln Renshaw, on 01/27/2024. A1c at that visit was > 15%.   PMH is significant for IDDM, diabetic retinopathy with R eye blindness, HTN w/ hx of hypertensive emergency, NSTEMI type II in the setting of demand ischemia secondary to hypertensive urgency in 06/2023.   At last visit with Dr. Lincoln Renshaw, we continued to experience significant barriers in his care. He is blind and has trouble injecting insulin . Additionally, he has significant SDOH barriers including limited transportation. In the past, we changed his insulin  syringes to pens so he can count the "clicks" associated with the dose dial of the pen device. Additionally, we set him for delivery with our pharmacy. For several months he would run out of insulin  due to his insulin  being delivered to the wrong address. We corrected this and updated our system with his correct address back in April.   Today, pt reports doing better. He is receiving his insulin  regularly. Verbalizes his dose of 20u BID of Lantus . Also tells me he is taking metformin  500 mg daily. Denies any NV, abdominal pain. Denies any diarrhea. Unfortunately, he cannot see to check blood sugar at home. He tells me today that he sometimes relies on the people he stays with but does not like to bother them. He currently lives alone. In addition to the above, pt endorses pain on his left buttock. Tells me it keeps him from sitting for prolonged periods of time. Tells me it feels like a boil and would like this evaluated today.   Family/Social History:  Fhx: no known positives Tobacco: never smoker  Alcohol: none reported   Current diabetes medications include: Lantus  20u BID, metformin  500 mg daily Patient reports adherence to taking all  medications as prescribed.   Insurance coverage: none  Patient denies hypoglycemic events since last visit with Dr. Lincoln Renshaw.   Reported home fasting blood sugars: not checking  Reported 2 hour post-meal/random blood sugars: not checking.  Patient denies nocturia (nighttime urination).  Patient denies any changes in neuropathy (nerve pain). Patient denies any visual improvement. Patient denies self foot exams.   Patient reported dietary habits: -Eating habits vary  -Some days, he skips meals as he cannot prepare his own meals and relies on those he lives with   Patient-reported exercise habits: none   O:  No GM present. No CGM in place.  Lab Results  Component Value Date   HGBA1C >15.0 01/27/2024   There were no vitals filed for this visit.  Lipid Panel     Component Value Date/Time   CHOL 156 06/17/2023 0415   TRIG 181 (H) 06/17/2023 0415   HDL 43 06/17/2023 0415   CHOLHDL 3.6 06/17/2023 0415   VLDL 36 06/17/2023 0415   LDLCALC 77 06/17/2023 0415   Clinical Atherosclerotic Cardiovascular Disease (ASCVD): No  The ASCVD Risk score (Arnett DK, et al., 2019) failed to calculate for the following reasons:   Risk score cannot be calculated because patient has a medical history suggesting prior/existing ASCVD   Patient is participating in a Managed Medicaid Plan: No   A/P: Diabetes longstanding currently uncontrolled. Patient's symptoms have improved, from a hyperglycemic standpoint, since injecting 20units BID of Lantus . I had him demonstrate how he counts his clicks and confirmed  he is taking 20 u BID as prescribed. Commended him for this! He is not having any current hypoglycemia. He is able to verbalize appropriate hypoglycemia management plan. Medication adherence seems to be improving. We just have a lot of barriers here. We will continue to have him auto-refill with home delivery using our pharmacy. -Continue Basaglar  20u BID. -Continue metformin  500 mg XR daily.   -Patient educated on purpose, proper use, and potential adverse effects of insulin , metformin  -Extensively discussed pathophysiology of diabetes, recommended lifestyle interventions, dietary effects on blood sugar control.  -Counseled on s/sx of and management of hypoglycemia.  -Next A1c anticipated 03/2024. -Made a same-day appt with Dr. Newlin for evaluation of boil.   Written patient instructions provided. Patient verbalized understanding of treatment plan.  Total time in face to face counseling 60 minutes.    Follow-up:  Pharmacist in 6 weeks  Marene Shape, PharmD, [place], CPP Clinical Pharmacist [facility] [phone]

## 2024-03-02 ENCOUNTER — Other Ambulatory Visit: Payer: Self-pay

## 2024-03-08 ENCOUNTER — Telehealth: Payer: Self-pay | Admitting: Licensed Clinical Social Worker

## 2024-03-08 NOTE — Telephone Encounter (Signed)
 H&V Care Navigation CSW Progress Note  Clinical Social Worker contacted patient by phone with assistance of Spanish language interpreter Broderick Canning, [phone]- to f/u on upcoming appt with [facility]. Pt can be complicated to coordinate care for due to language barrier, physical barriers (pt is blind), and currently uninsured with no transportation. Pt contacted to try and arrange a ride for upcoming [facility] appt. No answer x2 today- will re-attempt again later this week/next week for appt.  Patient is participating in a Managed Medicaid Plan:  No, self pay only   SDOH Screenings   Food Insecurity: Food Insecurity Present (01/28/2024)  Housing: High Risk (01/28/2024)  Transportation Needs: Unmet Transportation Needs (01/28/2024)  Utilities: Not At Risk (08/13/2023)  Depression (PHQ2-9): Low Risk  (09/19/2023)  Financial Resource Strain: High Risk (01/28/2024)  Tobacco Use: Low Risk  (03/01/2024)  Health Literacy: Inadequate Health Literacy (08/13/2023)    Nathen Balder, MSW, LCSW Clinical Social Worker II [facility] Heart/Vascular Care Navigation  [phone]- work cell phone (preferred)

## 2024-03-09 ENCOUNTER — Telehealth: Payer: Self-pay | Admitting: Licensed Clinical Social Worker

## 2024-03-09 NOTE — Telephone Encounter (Signed)
 H&V Care Navigation CSW Progress Note  Clinical Social Worker contacted patient by phone to f/u on transportation and provide check in. Pt reached at [phone] with assistance of Odilia Bennett, telephonic Spanish language interpreter [phone]. Re-introduced self, role, reason for call. Pt was not aware of his upcoming [facility] appt, shared I would assist with transportation. Inquired if pt had new roommate and if he was receiving any rental assistance. Pt states he has not paid rent in three months- lives alone. When I inquired where he was during wellness check as someone told police he was out paying rent he shares he had gone to the rental office since he had received an eviction letter. He thinks he has a month to come up with the money. Does not want to move, does not have any other options per his report of family or friends that he can go stay with, and that his friends "aren't here anymore" that were assisting before. I shared that I was concerned with his vision that he will need more support than he would get in a shelter and that although we explored vocational rehab etc that we do not have a way to ensure pt has ongoing income/assistance with rent or utilities etc- so a shelter would be the only available option. Pt then states he has someone coming to take him somewhere to speak with someone about rent assistance- does not have specifics on who these individuals are or what organization may be able to assist. I will f/u next week to provide reminder about appt and to see the outcome from this meeting. Aware pt has been engaged with [employer] and it may be that they are connecting him with additional resources. Will route this to Jerryl Morin [facility] at PCP clinic to see if any insight, will send voucher in to [facility] taxi.   Patient is participating in a Managed Medicaid Plan:  no,   SDOH Screenings   Food Insecurity: Food Insecurity Present (01/28/2024)  Housing: High Risk (01/28/2024)  Transportation  Needs: Unmet Transportation Needs (01/28/2024)  Utilities: Not At Risk (08/13/2023)  Depression (PHQ2-9): Low Risk  (09/19/2023)  Financial Resource Strain: High Risk (01/28/2024)  Tobacco Use: Low Risk  (03/01/2024)  Health Literacy: Inadequate Health Literacy (08/13/2023)    Nathen Balder, MSW, LCSW Clinical Social Worker II [facility] Health Heart/Vascular Care Navigation  [phone]- work cell phone (preferred)

## 2024-03-17 ENCOUNTER — Telehealth: Payer: Self-pay

## 2024-03-17 NOTE — Telephone Encounter (Signed)
 If he has a boil on his rectum again, he will need to be seen in ER as I do not drain rectal boils/abscesses.

## 2024-03-17 NOTE — Telephone Encounter (Signed)
 Called & spoke to the patient. Verified name & DOB. Inquired patient how he is doing. Patient stated that he is currently speaking to a priest named Claudis Cumber from [facility]. Claudis Cumber informed patient that he will be able to assist with a part of his rent by next Wednesday 03/24/2024. Patient currently unaware of amount that will be given to cover outstanding balance for rent. Patient was recently informed by the leasing office that if he does not pay at least half of the rent by the end of the month he will be evicted.   Patient stated that starting next month July a roommate named Lidio will start living with patient and assist with half of the rent. Roommate will bring his cousin starting August and assist patient with the other half of rent. Patient stated that Moira Andrews (Possibly from [employer]) is assisting patient with connecting him with the roommates to assist patient.   Additionally, patient stated that he is compliant with his medication. He is concerned about on-going eye pain.  He also feels that one of his medications may be causing side effects. He is experiencing diarrhea after taking metformin  but unsure if that is really the medication due to vision issues. Any food or water is upsetting his stomach and causing pain which then causes trouble sleeping.   Patient complaining of boil on buttocks causing pain again. Dr.Johnson please advise if you would like for this patient to come in for an appointment or send additional medications.  Luke, please advise when the patient's next insulin  shipment will be sent. The patient has insulin  to last him until the end of today but will not have any starting tomorrow 03/18/24.

## 2024-03-18 ENCOUNTER — Other Ambulatory Visit: Payer: Self-pay

## 2024-03-18 ENCOUNTER — Telehealth: Payer: Self-pay | Admitting: Licensed Clinical Social Worker

## 2024-03-18 NOTE — Telephone Encounter (Signed)
 H&V Care Navigation CSW Progress Note  Clinical Social Worker contacted patient by phone to f/u on transportation for appt tomorrow. Pt reached at [phone] with assistance of Spanish language interpreter Richad Champagne, [phone]. He shares he has a ride from a friend and someone to come with him. He does not need cab. Reminded him of appt time and he requests I text address to his phone so he can show friend bringing him. He was advised to bring his medications to appt. Orbie Binder, RN of updates for appt and let Boby Bury, [facility] with PCP team know as well that he has a friend bringing him.   He shares he has rent assistance from a church and then should have roommates in the coming months. He has no additional questions or concerns and we will see him during the appt tomorrow to answer any additional questions and hopefully update pt chart with additional contacts since his brother is not a reliable emergency contact.  Called and cancelled ride through [facility], will update vouchers with cancellation.   Patient is participating in a Managed Medicaid Plan:  No, self pay only  SDOH Screenings   Food Insecurity: Food Insecurity Present (01/28/2024)  Housing: High Risk (01/28/2024)  Transportation Needs: Unmet Transportation Needs (01/28/2024)  Utilities: Not At Risk (08/13/2023)  Depression (PHQ2-9): Low Risk  (09/19/2023)  Financial Resource Strain: High Risk (01/28/2024)  Tobacco Use: Low Risk  (03/01/2024)  Health Literacy: Inadequate Health Literacy (08/13/2023)    Nathen Balder, MSW, LCSW Clinical Social Worker II [facility] Health Heart/Vascular Care Navigation  [phone]- work cell phone (preferred)

## 2024-03-19 ENCOUNTER — Ambulatory Visit: Payer: Self-pay | Admitting: Internal Medicine

## 2024-03-22 ENCOUNTER — Telehealth: Payer: Self-pay | Admitting: Internal Medicine

## 2024-03-22 DIAGNOSIS — E7800 Pure hypercholesterolemia, unspecified: Secondary | ICD-10-CM | POA: Insufficient documentation

## 2024-03-22 DIAGNOSIS — I2510 Atherosclerotic heart disease of native coronary artery without angina pectoris: Secondary | ICD-10-CM | POA: Insufficient documentation

## 2024-03-22 NOTE — Telephone Encounter (Signed)
 Transportation Voucher completed.  Pick up date and time: 03/23/2024 9:00 AM  Pick up address: [address] [name] [address]  Drop off address: [address] [place], [address]

## 2024-03-22 NOTE — Telephone Encounter (Signed)
 Noted. Thank you all for your help.

## 2024-03-22 NOTE — Telephone Encounter (Signed)
 noted

## 2024-03-22 NOTE — Progress Notes (Signed)
    OFFICE NOTE:    Date:  03/24/2024  ID:  Drew [name], DOB 1976/02/03, MRN [id number] PCP: [name], MD  [facility] Providers Cardiologist:  Soyla DELENA Merck, MD       Patient Profile:  Coronary artery Ca2+  Admitted 06/2023: ? hsT in setting of hypertensive urgency (demand ischemia) TTE 06/17/23: EF 55-60, no RWMA, mild LVH, NL RVSF, NL PASP, RVSP 23.7, trivial MR, trivial AI PET MPI 09/17/23: no ischemia or infarction, EF 56, global MBFR abnormal (1.51), mild CAC; low risk  Hypertension  Diabetes mellitus  Diabetic retinopathy Hyperlipidemia Blindness        Discussed the use of AI scribe software for clinical note transcription with the patient, who gave verbal consent to proceed. History of Present Illness Drew [name] is a 48 y.o. male who returns for follow up of HTN. He was last seen in 11/2023 by [name], PA-C.   He is here with his friend and is seen with the help of an interpreter. He has not had chest discomfort, shortness of breath, or dizziness. There is no leg swelling. His blood pressure is well-controlled on amlodipine  5 mg daily, carvedilol  3.125 mg twice daily, and lisinopril  2.5 mg daily. He does not monitor his blood pressure at home.    ROS-See HPI             Physical Exam:  VS:  BP 130/78   Pulse 90   Ht 5' 4 (1.626 m)   Wt 147 lb (66.7 kg)   SpO2 98%   BMI 25.23 kg/m       Wt Readings from Last 3 Encounters:  03/24/24 147 lb (66.7 kg)  03/23/24 147 lb 12.8 oz (67 kg)  03/01/24 145 lb 12.8 oz (66.1 kg)    Constitutional:      Appearance: Healthy appearance. Not in distress.  Neck:     Vascular: JVD normal.  Pulmonary:     Breath sounds: Normal breath sounds. No wheezing. No rales.  Cardiovascular:     Normal rate. Regular rhythm.     Murmurs: There is no murmur.  Edema:    Peripheral edema absent.  Abdominal:     Palpations: Abdomen is soft.      Assessment and Plan:    Assessment & Plan Coronary  artery calcification seen on CT scan Mild coronary artery calcification noted on PET MPI in 08/2023. This study was low risk with no ischemia or infarction. He is doing well w/o chest pain to suggest angina. - Continue Lipitor 20 mg daily. - Schedule follow-up in 6 months unless symptoms change. Pure hypercholesterolemia Lipids managed by primary care. Goal LDL with CAC on CT is < 70. Essential hypertension Initially seen in 06/2023 for hypertensive urgency and elevated hsTroponins c/w demand ischemia. EF was normal on echocardiogram at that time and follow up PET MPI was low risk. Currently, his blood pressure is well-controlled on current therapy. BP somewhat borderline today but he has not had any medications yet. - Continue amlodipine  5 mg daily, carvedilol  3.125 mg twice daily, lisinopril  2.5 mg daily. - Follow-up in 6 months unless symptoms change.         Dispo:  Return in about 6 months (around 09/23/2024) for Routine Follow Up w/ Dr. Merck, or Glendia Ferrier, PA-C.  Signed, Glendia Ferrier, PA-C

## 2024-03-22 NOTE — Telephone Encounter (Signed)
 Copied from CRM [id number]. Topic: General - Transportation >> Mar 22, 2024  3:18 PM Tobias L wrote: Reason for CRM: Pt states he is now needing transportation for tomorrow's appt. Pt states his ride cancelled on him and he would need transportation for tomorrow's appt at 9:50am.   Please reach out to patient as soon as possible.

## 2024-03-23 ENCOUNTER — Encounter: Payer: Self-pay | Admitting: Nurse Practitioner

## 2024-03-23 ENCOUNTER — Ambulatory Visit: Payer: Self-pay | Attending: Nurse Practitioner | Admitting: Nurse Practitioner

## 2024-03-23 ENCOUNTER — Telehealth (HOSPITAL_BASED_OUTPATIENT_CLINIC_OR_DEPARTMENT_OTHER): Payer: Self-pay | Admitting: Licensed Clinical Social Worker

## 2024-03-23 VITALS — BP 126/80 | HR 94 | Resp 18 | Ht 64.0 in | Wt 147.8 lb

## 2024-03-23 DIAGNOSIS — L0231 Cutaneous abscess of buttock: Secondary | ICD-10-CM

## 2024-03-23 DIAGNOSIS — R1084 Generalized abdominal pain: Secondary | ICD-10-CM

## 2024-03-23 DIAGNOSIS — Z1211 Encounter for screening for malignant neoplasm of colon: Secondary | ICD-10-CM

## 2024-03-23 NOTE — Telephone Encounter (Signed)
 H&V Care Navigation CSW Progress Note  Clinical Social Worker contacted patient by phone to f/u on rescheduled appt. Was able to reach pt today at [phone] with assistance of Spanish language interpreter Hadassah, [phone]. He shares that he has a friend currently with him and that he will take him to his appt tomorrow. I spoke with his friend Christie Mcgee and texted him the address to his cell phone as requested at [phone]. He is a former coworker trying to help pt out as he can. No additional questions at this time. Will try and see pt during appt tomorrow.   Patient is participating in a Managed Medicaid Plan: No, self pay only   SDOH Screenings   Food Insecurity: Food Insecurity Present (01/28/2024)  Housing: High Risk (01/28/2024)  Transportation Needs: Unmet Transportation Needs (01/28/2024)  Utilities: Not At Risk (08/13/2023)  Depression (PHQ2-9): Low Risk  (09/19/2023)  Financial Resource Strain: High Risk (01/28/2024)  Tobacco Use: Low Risk  (03/23/2024)  Health Literacy: Inadequate Health Literacy (08/13/2023)     Marit Lark, MSW, LCSW Clinical Social Worker II [facility] Health Heart/Vascular Care Navigation  [phone]- work cell phone (preferred)

## 2024-03-23 NOTE — Progress Notes (Signed)
 Assessment & Plan:  Drew [name] was seen today for abdominal pain and diarrhea.  Diagnoses and all orders for this visit:  Generalized abdominal pain -     H. pylori breath test  Colon cancer screening -     Ambulatory referral to Gastroenterology  Gluteal abscess -     Ambulatory referral to General Surgery    Patient has been counseled on age-appropriate routine health concerns for screening and prevention. These are reviewed and up-to-date. Referrals have been placed accordingly. Immunizations are up-to-date or declined.    Subjective:   Chief Complaint  Patient presents with   Abdominal Pain   Diarrhea    Drew [name] 48 y.o. male presents to office today with complaints of generalized abdominal pain and recurrent gluteal abscess  He is a patient of Dr. Vicci.   Patient with history of diabetes type 2 (antibodies neg 07/2023) with retinopathy, blind in the right eye, HTN, CAS RT, HL, NSTEMI   VRI was used to communicate directly with patient for the entire encounter including providing detailed patient instructions.    Abdominal Pain: Patient complains of abdominal pain. The pain is described as aching, burning, and sharp. Pain is generalized and does not radiate. Onset was several months ago. Symptoms have been unchanged since. Aggravating factors: none.  Alleviating factors: none. Associated symptoms: constipation and diarrhea. The patient denies hematochezia, melena, nausea, and vomiting. Recent stool test for CDiff and parasites was negative.    He has chronic re occurring left gluteal abscess. Was recently treated with keflex  a few weeks ago for same. Today he reports pain in the area however on exam there is no purulent drainage or erythema present. There is palpable tenderness.       Review of Systems  Constitutional:  Negative for fever, malaise/fatigue and weight loss.  HENT: Negative.  Negative for nosebleeds.   Eyes: Negative.  Negative for blurred  vision, double vision and photophobia.  Respiratory: Negative.  Negative for cough and shortness of breath.   Cardiovascular: Negative.  Negative for chest pain, palpitations and leg swelling.  Gastrointestinal:  Positive for abdominal pain, constipation and diarrhea. Negative for blood in stool, heartburn, melena, nausea and vomiting.  Musculoskeletal: Negative.  Negative for myalgias.  Skin:        SEE HPI  Neurological: Negative.  Negative for dizziness, focal weakness, seizures and headaches.  Psychiatric/Behavioral: Negative.  Negative for suicidal ideas.     Past Medical History:  Diagnosis Date   Diabetes mellitus without complication (HCC)     History reviewed. No pertinent surgical history.  History reviewed. No pertinent family history.  Social History Reviewed with no changes to be made today.   Outpatient Medications Prior to Visit  Medication Sig Dispense Refill   amLODipine  (NORVASC ) 5 MG tablet Take 1 tablet (5 mg total) by mouth daily. 90 tablet 1   atorvastatin  (LIPITOR) 20 MG tablet Take 1 tablet (20 mg total) by mouth daily. 90 tablet 1   Blood Glucose Monitoring Suppl (BLOOD GLUCOSE MONITOR SYSTEM) w/Device KIT Use 3 (three) times daily. 1 kit 0   carvedilol  (COREG ) 3.125 MG tablet Take 1 tablet (3.125 mg total) by mouth 2 (two) times daily with a meal. 180 tablet 2   cephALEXin  (KEFLEX ) 500 MG capsule Take 1 capsule (500 mg total) by mouth 2 (two) times daily. 14 capsule 0   gabapentin  (NEURONTIN ) 300 MG capsule Take 1 capsule (300 mg total) by mouth at bedtime. 90 capsule 3   Glucose  Blood (BLOOD GLUCOSE TEST STRIPS) STRP Use 3 (three) times daily as directed to check blood sugar. 100 strip 0   ibuprofen  (ADVIL ) 600 MG tablet Take 1 tablet (600 mg total) by mouth every 8 (eight) hours as needed. 30 tablet 0   Insulin  Glargine (BASAGLAR  KWIKPEN) 100 UNIT/ML Inject 20 Units into the skin 2 (two) times daily. 36 mL 1   Insulin  Pen Needle (PEN NEEDLES) 32G X 4 MM  MISC Use to inject Basaglar  once daily. 100 each 3   Insulin  Syringe-Needle U-100 (TRUEPLUS INSULIN  SYRINGE) 31G X 5/16 0.5 ML MISC Use to inject 70/30 insulin  twice daily. 100 each 6   lisinopril  (ZESTRIL ) 2.5 MG tablet Take 1 tablet (2.5 mg total) by mouth daily. 90 tablet 1   loperamide  (IMODIUM  A-D) 2 MG tablet Take 1 tablet (2 mg total) by mouth 3 (three) times daily as needed for diarrhea or loose stools. 24 tablet 0   metFORMIN  (GLUCOPHAGE -XR) 500 MG 24 hr tablet Take 1 tablet (500 mg total) by mouth daily with breakfast. 90 tablet 1   miconazole  (MICATIN) 2 % cream Apply 1 Application topically 2 (two) times daily. 30 g 0   No facility-administered medications prior to visit.    No Known Allergies     Objective:    BP 126/80 (BP Location: Left Arm, Patient Position: Sitting, Cuff Size: Normal)   Pulse 94   Resp 18   Ht 5' 4 (1.626 m)   Wt 147 lb 12.8 oz (67 kg)   SpO2 99%   BMI 25.37 kg/m  Wt Readings from Last 3 Encounters:  03/23/24 147 lb 12.8 oz (67 kg)  03/01/24 145 lb 12.8 oz (66.1 kg)  01/27/24 140 lb (63.5 kg)    Physical Exam Vitals and nursing note reviewed.  Constitutional:      Appearance: He is well-developed.  HENT:     Head: Normocephalic and atraumatic.   Cardiovascular:     Rate and Rhythm: Normal rate and regular rhythm.     Heart sounds: Normal heart sounds. No murmur heard.    No friction rub. No gallop.  Pulmonary:     Effort: Pulmonary effort is normal. No tachypnea or respiratory distress.     Breath sounds: Normal breath sounds. No decreased breath sounds, wheezing, rhonchi or rales.  Chest:     Chest wall: No tenderness.  Abdominal:     General: Bowel sounds are normal.     Palpations: Abdomen is soft.   Musculoskeletal:        General: Normal range of motion.     Cervical back: Normal range of motion.   Skin:    General: Skin is warm and dry.     Findings: No abscess.       Neurological:     Mental Status: He is alert and  oriented to person, place, and time.     Coordination: Coordination normal.   Psychiatric:        Behavior: Behavior normal. Behavior is cooperative.        Thought Content: Thought content normal.        Judgment: Judgment normal.          Patient has been counseled extensively about nutrition and exercise as well as the importance of adherence with medications and regular follow-up. The patient was given clear instructions to go to ER or return to medical center if symptoms don't improve, worsen or new problems develop. The patient verbalized understanding.   Follow-up: Return  if symptoms worsen or fail to improve.   Haze LELON Servant, FNP-BC [facility] [place], [place] [phone]   03/23/2024, 1:34 PM

## 2024-03-24 ENCOUNTER — Encounter: Payer: Self-pay | Admitting: Physician Assistant

## 2024-03-24 ENCOUNTER — Ambulatory Visit: Payer: Self-pay | Attending: Cardiology | Admitting: Physician Assistant

## 2024-03-24 ENCOUNTER — Telehealth: Payer: Self-pay | Admitting: Licensed Clinical Social Worker

## 2024-03-24 VITALS — BP 130/78 | HR 90 | Ht 64.0 in | Wt 147.0 lb

## 2024-03-24 DIAGNOSIS — I10 Essential (primary) hypertension: Secondary | ICD-10-CM

## 2024-03-24 DIAGNOSIS — I2510 Atherosclerotic heart disease of native coronary artery without angina pectoris: Secondary | ICD-10-CM

## 2024-03-24 DIAGNOSIS — E7800 Pure hypercholesterolemia, unspecified: Secondary | ICD-10-CM

## 2024-03-24 NOTE — Assessment & Plan Note (Signed)
 Lipids managed by primary care. Goal LDL with CAC on CT is < 70.

## 2024-03-24 NOTE — Assessment & Plan Note (Signed)
 Initially seen in 06/2023 for hypertensive urgency and elevated hsTroponins c/w demand ischemia. EF was normal on echocardiogram at that time and follow up PET MPI was low risk. Currently, his blood pressure is well-controlled on current therapy. BP somewhat borderline today but he has not had any medications yet. - Continue amlodipine  5 mg daily, carvedilol  3.125 mg twice daily, lisinopril  2.5 mg daily. - Follow-up in 6 months unless symptoms change.

## 2024-03-24 NOTE — Progress Notes (Signed)
 H&V Care Navigation CSW Progress Note  Clinical Social Worker met with patient to f/u on ongoing care needs. LCSW was assisted by in person Spanish language interpreter Marsa and pt accompanied today by his friend Lidio. Pt brought with him his medications, he feels he has been okay taking them, has been feeling relatively well and has been working on his housing situation. He has found roommates from his previous job/community and they are going to be able to split rent for July and onward. Unfortunately he does owe April, May, June. Pt aware that patient care fund with Heart/Vascular is not going to be an option at this time due to no ongoing rent/ability to receive Medicaid or disability. Pt has maxed out assistance from [employer] ($1000). He has been in contact with Toni at [employer] and a local priest (Father Curlee at [place]. Elenor Ip) who have offered assistance but he was told he needs to know that notarys name at leasing agency. LCSW is unclear what this is. Offered to assist by contacting [employer] to see how they have connected pt/any next steps we can assist pt with at this time. Pt agreeable to this. If I am not able to connect with [employer] I will reach out to pt to see if I can call Father Curlee.  No additional questions from pt at this time. Will f/u as able with any updates.   Patient is participating in a Managed Medicaid Plan:  No, self pay only  SDOH Screenings   Food Insecurity: Food Insecurity Present (03/24/2024)  Housing: High Risk (03/24/2024)  Transportation Needs: Unmet Transportation Needs (03/24/2024)  Utilities: Not At Risk (08/13/2023)  Depression (PHQ2-9): Low Risk  (09/19/2023)  Financial Resource Strain: High Risk (01/28/2024)  Tobacco Use: Low Risk  (03/24/2024)  Health Literacy: Inadequate Health Literacy (03/24/2024)    Marit Lark, MSW, LCSW Clinical Social Worker II [facility] Health Heart/Vascular Care Navigation  [phone]- work cell  phone (preferred)

## 2024-03-24 NOTE — Assessment & Plan Note (Signed)
 Mild coronary artery calcification noted on PET MPI in 08/2023. This study was low risk with no ischemia or infarction. He is doing well w/o chest pain to suggest angina. - Continue Lipitor 20 mg daily. - Schedule follow-up in 6 months unless symptoms change.

## 2024-03-24 NOTE — Telephone Encounter (Signed)
 H&V Care Navigation CSW Progress Note  Clinical Social Worker received an email back from Keeler with [employer] from my message this morning. She let me know the following: We've arranged food deliveries and received recent donations to help cover the client's rent. These resources should take care of the pending payments.  We do have a notary available in the office, but I'm not sure why he believes one is needed. As far as I know, there's no current need for notarization on our end. I'll check with him to clarify.  Right now, his main concern is his hospital treatment. Would you be able to assist us  with filing the [facility] Application?  Regarding his vision, he mentioned that he can now see when there is light, but his vision remains limited. All the food we've been sending is user-friendly, and he noted he can manage to make eggs. I just want to make sure he is safe. Please let me know if we should continue sending microwaveable items or if it's appropriate to include more stove-top options.  Responded back thanking her for the updates, and provided her information that new roommates seem that they can assist him as well with food preparation. Shared that he was denied for CAFA, can reapply in September and I am happy to assist him at that time- pt denied in March due to not being able to provide the documents needed for assistance (pay stubs etc).  Remain available for any other updates from [facility] and [employer] team- this update was routed to Slater NOVAK, RNCM at [facility]. Will also f/u with Clarisa at PCP clinic as she has been instrumental in patient assistance as well.   Patient is participating in a Managed Medicaid Plan:  No, self pay only  SDOH Screenings   Food Insecurity: Food Insecurity Present (03/24/2024)  Housing: High Risk (03/24/2024)  Transportation Needs: Unmet Transportation Needs (03/24/2024)  Utilities: Not At Risk (08/13/2023)  Depression (PHQ2-9): Low Risk   (09/19/2023)  Financial Resource Strain: High Risk (01/28/2024)  Tobacco Use: Low Risk  (03/24/2024)  Health Literacy: Inadequate Health Literacy (03/24/2024)    Marit Lark, MSW, LCSW Clinical Social Worker II [facility] Health Heart/Vascular Care Navigation  [phone]- work cell phone (preferred)

## 2024-03-24 NOTE — Patient Instructions (Signed)
 Medication Instructions:  Your physician recommends that you continue on your current medications as directed. Please refer to the Current Medication list given to you today.  *If you need a refill on your cardiac medications before your next appointment, please call your pharmacy*  Lab Work: None ordered  If you have labs (blood work) drawn today and your tests are completely normal, you will receive your results only by: MyChart Message (if you have MyChart) OR A paper copy in the mail If you have any lab test that is abnormal or we need to change your treatment, we will call you to review the results.  Testing/Procedures: None ordered  Follow-Up: At [facility], you and your health needs are our priority.  As part of our continuing mission to provide you with exceptional heart care, our providers are all part of one team.  This team includes your primary Cardiologist (physician) and Advanced Practice Providers or APPs (Physician Assistants and Nurse Practitioners) who all work together to provide you with the care you need, when you need it.  Your next appointment:   6 month(s)  Provider:   Soyla DELENA Merck, MD or Glendia Ferrier, PA-C          We recommend signing up for the patient portal called MyChart.  Sign up information is provided on this After Visit Summary.  MyChart is used to connect with patients for Virtual Visits (Telemedicine).  Patients are able to view lab/test results, encounter notes, upcoming appointments, etc.  Non-urgent messages can be sent to your provider as well.   To learn more about what you can do with MyChart, go to [email or web address].   Other Instructions

## 2024-03-25 LAB — H. PYLORI BREATH TEST: H pylori Breath Test: NEGATIVE

## 2024-03-25 LAB — H. PYLORI BREATH COLLECTION

## 2024-03-26 ENCOUNTER — Ambulatory Visit: Payer: Self-pay | Admitting: Nurse Practitioner

## 2024-03-26 ENCOUNTER — Telehealth: Payer: Self-pay | Admitting: Licensed Clinical Social Worker

## 2024-03-26 NOTE — Telephone Encounter (Signed)
 Thank you for the update. I will route to jane to keep her looped in as well. Excited that he is able to find additional help and support.

## 2024-03-26 NOTE — Telephone Encounter (Signed)
 H&V Care Navigation CSW Progress Note  Clinical Social Worker contacted patient by phone to f/u on updates from [employer] and remind him of upcoming appt on Monday for his recurrent abscesses. Was able to reach him with assistance of Spanish language interpreter [place], [phone]. He shares he received an assistance check for partial rent from priest at [facility]. [facility]. Discussed that Toni at [employer] also has been able to raise donations to cover his past rent as well, encouraged him to make sure that his landlord has payments needed as soon as he is able to get those to them.   Pt aware of his appt on Monday and has a ride to it. No additional questions, hopeful this appt on Monday will be helpful for the discomfort he is having, and he will reach out to Deputy about assistance logistics.   Patient is participating in a Managed Medicaid Plan:  No, self pay only  SDOH Screenings   Food Insecurity: Food Insecurity Present (03/24/2024)  Housing: High Risk (03/24/2024)  Transportation Needs: Unmet Transportation Needs (03/24/2024)  Utilities: Not At Risk (08/13/2023)  Depression (PHQ2-9): Low Risk  (09/19/2023)  Financial Resource Strain: High Risk (01/28/2024)  Tobacco Use: Low Risk  (03/24/2024)  Health Literacy: Inadequate Health Literacy (03/24/2024)    Marit Lark, MSW, LCSW Clinical Social Worker II [facility] Heart/Vascular Care Navigation  [phone]- work cell phone (preferred)

## 2024-03-29 ENCOUNTER — Ambulatory Visit: Payer: Self-pay | Admitting: Surgery

## 2024-03-30 NOTE — Telephone Encounter (Signed)
 noted

## 2024-04-05 ENCOUNTER — Encounter: Payer: Self-pay | Admitting: Surgery

## 2024-04-05 ENCOUNTER — Other Ambulatory Visit: Payer: Self-pay

## 2024-04-05 ENCOUNTER — Ambulatory Visit (INDEPENDENT_AMBULATORY_CARE_PROVIDER_SITE_OTHER): Payer: Self-pay | Admitting: Surgery

## 2024-04-05 VITALS — BP 135/78 | HR 105 | Temp 98.1°F | Ht 64.0 in | Wt 142.0 lb

## 2024-04-05 DIAGNOSIS — K611 Rectal abscess: Secondary | ICD-10-CM

## 2024-04-05 MED ORDER — AMOXICILLIN-POT CLAVULANATE 875-125 MG PO TABS
1.0000 | ORAL_TABLET | Freq: Two times a day (BID) | ORAL | 0 refills | Status: DC
  Filled 2024-04-05: qty 20, 10d supply, fill #0

## 2024-04-05 NOTE — Progress Notes (Signed)
 04/05/2024  Reason for Visit:  Left gluteal abscess  Requesting Provider:  Haze Servant, NP  History of Present Illness: Drew [name] is a 48 y.o. male presenting for evaluation of a left gluteal abscess.  The patient unfortunately has uncontrolled diabetes which has resulted in diabetic retinopathy and blindness.  When standing in front of him, he mentions that he's not able to see me, but he sees some lights and figures/shapes of people.  He had I&D of a medial left gluteal abscess on 08/20/23 in the ED.  He reports that this area healed but then this year it has been bothering him again.  He was given Keflex  course last month for this and it was improving.  He reports that about a week ago, he slipped after showering and landed on his buttocks and left side.  Since then, the area became more tender and more inflamed.  He denies any open wound or drainage, but feels pressure as if something is going to burst.  He reports feeling feverish and with chills at home.  He has had a decreased appetite over the past few days as well.    Past Medical History: Past Medical History:  Diagnosis Date   Abscess, gluteal, left    Coronary artery disease    Diabetic retinopathy (HCC)    Hyperlipidemia associated with type 2 diabetes mellitus (HCC)    Hypertension    Type 2 diabetes mellitus (HCC)      Past Surgical History: History reviewed. No pertinent surgical history.  Home Medications: Prior to Admission medications   Medication Sig Start Date End Date Taking? Authorizing Provider  amoxicillin -clavulanate (AUGMENTIN ) 875-125 MG tablet Take 1 tablet by mouth 2 (two) times daily for 10 days. 04/05/24 04/15/24 Yes Deronte Solis, Aloysius, MD  amLODipine  (NORVASC ) 5 MG tablet Take 1 tablet (5 mg total) by mouth daily. 01/19/24 04/19/24  [name], MD  atorvastatin  (LIPITOR) 20 MG tablet Take 1 tablet (20 mg total) by mouth daily. 01/19/24 04/19/24  [name], MD  Blood Glucose Monitoring Suppl  (BLOOD GLUCOSE MONITOR SYSTEM) w/Device KIT Use 3 (three) times daily. 06/18/23   Perri DELENA Meliton Mickey., MD  carvedilol  (COREG ) 3.125 MG tablet Take 1 tablet (3.125 mg total) by mouth 2 (two) times daily with a meal. 01/27/24 04/26/24  [name], MD  cephALEXin  (KEFLEX ) 500 MG capsule Take 1 capsule (500 mg total) by mouth 2 (two) times daily. 03/01/24   Newlin, Enobong, MD  gabapentin  (NEURONTIN ) 300 MG capsule Take 1 capsule (300 mg total) by mouth at bedtime. 01/27/24   [name], MD  Glucose Blood (BLOOD GLUCOSE TEST STRIPS) STRP Use 3 (three) times daily as directed to check blood sugar. 06/18/23   Perri DELENA Meliton Mickey., MD  ibuprofen  (ADVIL ) 600 MG tablet Take 1 tablet (600 mg total) by mouth every 8 (eight) hours as needed. 03/01/24   Newlin, Enobong, MD  Insulin  Glargine (BASAGLAR  KWIKPEN) 100 UNIT/ML Inject 20 Units into the skin 2 (two) times daily. 01/27/24   [name], MD  Insulin  Pen Needle (PEN NEEDLES) 32G X 4 MM MISC Use to inject Basaglar  once daily. 10/27/23   [name], MD  Insulin  Syringe-Needle U-100 (TRUEPLUS INSULIN  SYRINGE) 31G X 5/16 0.5 ML MISC Use to inject 70/30 insulin  twice daily. 09/19/23   [name], MD  lisinopril  (ZESTRIL ) 2.5 MG tablet Take 1 tablet (2.5 mg total) by mouth daily. 01/27/24   [name], MD  loperamide  (IMODIUM   A-D) 2 MG tablet Take 1 tablet (2 mg total) by mouth 3 (three) times daily as needed for diarrhea or loose stools. 11/25/23   [name], MD  metFORMIN  (GLUCOPHAGE -XR) 500 MG 24 hr tablet Take 1 tablet (500 mg total) by mouth daily with breakfast. 01/27/24   [name], MD  miconazole  (MICATIN) 2 % cream Apply 1 Application topically 2 (two) times daily. 12/05/23   [name], MD    Allergies: No Known Allergies  Social History:  reports that he has never smoked. He has never used smokeless tobacco. He reports that he does not drink alcohol and does not use drugs.   Family  History: History reviewed. No pertinent family history.  Review of Systems: Review of Systems  Constitutional:  Negative for chills and fever.  Respiratory:  Negative for shortness of breath.   Cardiovascular:  Negative for chest pain.  Gastrointestinal:  Positive for nausea. Negative for constipation.  Genitourinary:  Negative for dysuria.  Skin:        Left gluteal area of tenderness/firmness    Physical Exam BP 135/78   Pulse (!) 105   Temp 98.1 F (36.7 C) (Oral)   Ht 5' 4 (1.626 m)   Wt 142 lb (64.4 kg)   SpO2 98%   BMI 24.37 kg/m  CONSTITUTIONAL: No acute distress HEENT:  Normocephalic, atraumatic, extraocular motion intact. RESPIRATORY: Normal respiratory effort without pathologic use of accessory muscles. CARDIOVASCULAR: Regular rhythm and rate. MUSCULOSKELETAL:  Normal muscle strength and tone in all four extremities.  No peripheral edema or cyanosis. SKIN: The patient has over the anterior portion of the medial left gluteal area, an area of induration and erythema about 6 x 3 cm in size, without any fluctuance, without any ulceration or open wound.  There is a central scar from prior I&D from last year. NEUROLOGIC:  Motor and sensation is grossly normal.  Cranial nerves are grossly intact. PSYCH:  Alert and oriented to person, place and time. Affect is normal.  Laboratory Analysis: No results found for this or any previous visit (from the past 24 hours).  Imaging: No results found.  Assessment and Plan: This is a 48 y.o. male with left medial gluteal abscess  --Discussed with the patient that the area of concern shows induration and erythema, but no fluctuance at this point.  No I&D needs to be performed today.  He got somewhat better with Keflex , but we will do a 10 day course of Augmentin  to help with better coverage.  Also recommended that he do warm compresses vs warm showers to help promote any drainage.  He can also take Tylenol  or ibuprofen  for pain  control.   --Recommended also that he needs to take better control of his diabetes.  He says that he does not check his glucose at home as he cannot see.  He will likely need assistance from his support groups to help with this.   --Follow up with me in 2 weeks.  I spent 30 minutes dedicated to the care of this patient on the date of this encounter to include pre-visit review of records, face-to-face time with the patient discussing diagnosis and management, and any post-visit coordination of care.   Aloysius Sheree Plant, MD [facility]

## 2024-04-05 NOTE — Patient Instructions (Addendum)
 Blood Glucose Monitoring, Adult To manage your diabetes, you'll need to keep track of your blood sugar. This is called blood glucose monitoring. Check your blood glucose as often as told. Keep a journal of your results over time. This can help you: Know when to adjust your diabetes management plan with your health care provider. See how food, exercise, illness, and medicines affect your blood glucose. Know what your blood glucose is at any time. Your provider will set specific goals for your blood glucose levels. In many cases, these goals may be: Before meals: 80-130 mg/dL (4.4-7.2 mmol/L). After meals: below 180 mg/dL (10 mmol/L). A1C level: less than 7%. Supplies needed: Blood glucose meter. Test strips for your meter. Each brand of meter has its own strips. You must use the strips that came with your meter. A lancet. This is a sharp device used to poke your finger. Do not use a lancet more than once. A journal or logbook to write down your results. How to check your blood glucose Checking your blood glucose  Wash your hands with soap and water for at least 20 seconds. Use the lancet to poke the side of your finger. Do not poke the tip of your finger. Also, try not to use the same finger each time. Gently squeeze the finger until a small drop of blood appears. Follow the meter instructions on how to insert the test strip, apply blood to the strip, and use the meter. Write down your result and any notes. Using different sites Some blood glucose meters allow testing on other parts of your body to test your blood. The most common places are the forearm, thigh, upper arm, and palm of the hand. Check your meter's instructions. Using different sites may not be as accurate as your fingers. If you think you have low blood glucose, only use your finger. General tips Blood glucose log  Write down the result each time you check your blood glucose. Note anything that may be affecting your blood  glucose. This can help you and your provider: Look for patterns over time. Adjust your management plan as needed. Check if your meter has an app or lets you download your records to a computer. Most meters keep a record of glucose readings in the meter. If you have type 1 diabetes: Check your blood glucose as often as told. This may be: Before each meal and snack. Two hours after a meal. Before bedtime. If you have symptoms of hypoglycemia. After treating your hypoglycemia. Before doing things that have a risk of injury, such as driving or using machinery. Before and after exercise. Between 2:00 a.m. and 3:00 a.m. You may need to check your blood glucose more often, such as up to 6-10 times a day, if: You have diabetes that is not well controlled. You are ill. You have a history of severe hypoglycemia. You have hypoglycemia unawareness. If you have type 2 diabetes: You may need to check your blood glucose 2 or more times a day. Check your blood glucose as often as told by your provider. This may include: Before and after exercise. Before doing things that have a risk of injury, such as driving or using machinery. You may need to check your blood glucose more often if: Your medicine is being adjusted. Your diabetes is not well controlled. You are ill. General tips Always have your blood glucose meter and supplies with you. After you use a few boxes of test strips, adjust your blood glucose meter  as needed. Follow the meter instructions. If you have questions or need help, all blood glucose meters have a 24-hour hotline phone number that you can call. Also, contact your provider with any questions or concerns. Where to find more information The American Diabetes Association: diabetes.org The Association of Diabetes Care & Education Specialists: diabeteseducator.org Contact a health care provider if: Your blood glucose is at or above 240 mg/dL (86.6 mmol/L) for 2 days in a row. You  have been sick or have had a fever for 2 days or longer and are not getting better. You have any of these problems for more than 6 hours: You cannot eat or drink. You have nausea or vomiting. You have diarrhea. Get help right away if: Your blood glucose is lower than 54 mg/dL (3 mmol/L). You become confused, or you have trouble thinking clearly. You have trouble breathing. You have moderate to high ketone levels in your pee. These symptoms may be an emergency. Get help right away. Call 911. Do not wait to see if the symptoms will go away. Do not drive yourself to the hospital. This information is not intended to replace advice given to you by your health care provider. Make sure you discuss any questions you have with your health care provider. Document Revised: 04/29/2023 Document Reviewed: 08/02/2022 Elsevier Patient Education  2024 Elsevier Inc.       Absceso anorrectal Anorectal Abscess Un absceso es una zona infectada que contiene pus. Un absceso anorrectal es un absceso que se forma cerca de la abertura del ano o alrededor del recto. Si no se trata, el absceso puede crecer y causar otros problemas. Estos problemas pueden incluir una infeccin o dolor ms intenso en todo el cuerpo, especialmente al defecar. Cules son las causas? La causa de un absceso anorrectal es la obstruccin de las glndulas o una infeccin en: El ano. La zona que est entre el ano y Insurance account manager en los hombres o entre el ano y la vagina en las mujeres (perineo). Qu incrementa el riesgo? Es ms probable que pueda presentar esta afeccin si: Est embarazada. Tiene diabetes o una enfermedad inflamatoria del intestino, como la enfermedad de Crohn. Ha tenido [employer]. Tiene ciertos tipos de cncer. Estos incluyen cncer rectal, leucemia o linfoma. Le han administrado medicamentos para destruir las clulas cancerosas (quimioterapia). Tiene grietas en el ano (fisuras anales). Estas grietas  pueden formarse si tiene estreimiento que dura mucho tiempo o no desaparece. Tiene una infeccin de transmisin sexual (ITS). Cules son los signos o sntomas? El sntoma principal de esta afeccin es Chief Technology Officer. Puede ser un dolor punzante que empeora al defecar. Otros sntomas incluyen: Hinchazn, enrojecimiento o pus cerca del ano. El enrojecimiento se puede extender ms all del absceso y parecerse a una lnea roja en la piel. Un bulto o tejido doloroso cerca del ano. Hemorragia o secrecin parecida al pus en la zona. Lajune o sudores nocturnos. Debilidad y cansancio (fatiga). Estreimiento o dolor al [employer]. Dolor en la parte inferior del [place]. Cmo se diagnostica? Esta afeccin se diagnostica en funcin de sus antecedentes mdicos y de un examen fsico. El examen puede incluir lo siguiente: Examen Ecologist. Esto implica que el mdico le introduzca un dedo enguantado en el ano y el recto para detectar si hay problemas. Un examen vaginal. Consiste en que el mdico le examine la vagina para detectar si hay problemas. Usar un tubo con ignacia luz y una cmara en el extremo (colonoscopio) para observar el recto.  Tambin pueden hacerle pruebas, como una resonancia magntica (RM) o una exploracin por tomografa computarizada (TC). Cmo se trata? El tratamiento de esta afeccin puede incluir lo siguiente: Incisin y drenaje del absceso. Se realiza una incisin en el absceso y se drena el pus. Medicamentos. Estos pueden incluir analgsicos, medicamentos para ayudar a defecar (laxantes), ablandadores de heces o antibiticos. Siga estas instrucciones en su casa: Medicamentos Tome los medicamentos de venta libre y los recetados solamente como se lo haya indicado el mdico. Si le recetaron antibiticos, selos como se lo haya indicado el mdico. No deje de usar el antibitico aunque comience a Actor. Pregntele al mdico si el medicamento recetado le impide conducir o usar  [place]. Cuidado de la herida  Siga las instrucciones del mdico acerca de cmo cuidarse la herida. Asegrese de hacer lo siguiente: Lvese las manos con agua y jabn durante al menos 20 segundos antes y despus de cambiarse la venda (vendaje). Use desinfectante para manos si no dispone de france y [place]. Cambie el vendaje como se lo haya indicado el mdico. No retire los puntos (suturas), la goma para cerrar la piel ni las tiras de qatar. Es posible que estos cierres cutneos deban quedar puestos en la piel durante 2 semanas o ms tiempo. Si los bordes de las tiras de [place] a despegarse y Scientific laboratory technician, puede recortar los que estn sueltos. No retire las tiras de cinta adhesiva por completo a menos que el mdico se lo indique. Si se colocaron uno o ms tubos (drenajes) para drenar el pus, tenga cuidado de no tirar de ellos. El mdico le dir cunto tiempo debe tenerlos colocados. Controle la zona del ArvinMeritor para ver si hay signos de infeccin. Est atento a los siguientes signos: Aumento del enrojecimiento, la hinchazn o Chief Technology Officer. Ms lquido malva bertrand. Calor. Pus o mal olor. Control del dolor, el adormecimiento y la hinchazn  Para ayudar a Engineer, materials, pruebe sentarse: Sobre una almohadilla trmica con el ajuste en temperatura baja. Sobre un almohadn inflable con forma de aro. Si se lo indican, aplique hielo en la zona afectada. Ponga el hielo en una bolsa plstica. Coloque una toalla entre la piel y la bolsa. Aplique el hielo durante 20 minutos, 2 a 3 veces por da. Si la piel se le pone de color rojo brillante, retire el hielo de inmediato para evitar daos en la piel. El [place] de dao es mayor si no puede sentir dolor, Airline pilot o fro. Instrucciones generales Tome un bao de asiento 3 o 4 veces por da y Best boy. Un bao de asiento es un bao poco profundo de agua tibia que se toma mientras se est sentado. El agua solo debe Insurance account manager las caderas y debe cubrirle las nalgas. Siga las instrucciones del mdico respecto de lo que puede comer y beber. Concurra a todas las visitas de seguimiento. Es posible que el mdico deba quitarle los puntos y asegurarse de que el absceso haya desaparecido. Dnde obtener ms informacin Geophysicist/field seismologist of Colon & Rectal Surgeons (Sociedad Estadounidense de [place] de Colon y Recto): [email or web address] Comunquese con un mdico si: Tiene cualquier signo de infeccin. Tiene fiebre o escalofros. Tiene dificultad para defecar. Solicite ayuda de inmediato si: Tiene dificultad para mover o usar las piernas. Tiene dolor intenso o dolor que [place]. Tiene una hinchazn que empeora. Tiene mucho ms sangrado o secrecin de pus. Esta informacin no tiene Theme park manager el consejo del mdico. Manufacturing engineer  de hacerle al mdico cualquier pregunta que tenga. Document Revised: 06/17/2022 Document Reviewed: 06/17/2022 Elsevier Patient Education  2024 Elsevier Inc.        Please call the office if you have any questions or concerns

## 2024-04-06 ENCOUNTER — Ambulatory Visit: Payer: Self-pay

## 2024-04-06 NOTE — Telephone Encounter (Signed)
 FYI Only or Action Required?: FYI only for provider.  Patient was last seen in primary care on 03/23/2024 by Theotis Haze ORN, NP.  Called Nurse Triage reporting Abdominal Pain.  Symptoms began several days ago.  Interventions attempted: OTC medications: Augmentin  and Ice/heat application.  Symptoms are: gradually worsening.  Triage Disposition: Call PCP Now- Appt scheduled for 7/17 at 2:10. Transportation arranged for pickup at 1:30 from patients home. Pt was advised to continue taking abx and maintain hydration. RN advising patient tht if symptoms persist then to go ahead and go to the ED.   Patient/caregiver understands and will follow disposition?: Yes        Copied from CRM [id number]. Topic: Clinical - Red Word Triage >> Apr 06, 2024  4:03 PM Yolanda T wrote: Red Word that prompted transfer to Nurse Triage: patient called stated he was having a lot of pain in his left butt cheek and an upset stomach when he eat or drink any kind of fluid. He is at the point that he is unable to walk due to the pain Reason for Disposition  [1] Recent medical visit within 24 hours AND [2] condition / symptoms WORSE  Answer Assessment - Initial Assessment Questions 1. MAIN CONCERN OR SYMPTOM:  What is your main concern right now? What question do you have? What's the main symptom you're worried about? (e.g., breathing difficulty, cough, fever. pain)     Abdominal pain and diarrhea, Patient says that he cannot eat and has been vomiting  2. ONSET: When did the  abdominal pain  start?     *No Answer* 3. BETTER-SAME-WORSE: Are you getting better, staying the same, or getting worse compared to how you felt at your last visit to the doctor (most recent medical visit)?     Getting worse  4. VISIT DATE: When were you seen? (Date)     04/05/24  5. VISIT DOCTOR: What is the name of the doctor taking care of you now?     Haze Theotis, NP  6. VISIT DIAGNOSIS:  What was the main symptom or  problem that you were seen for? Were you given a diagnosis?      Left gluteal abscess  7. VISIT MEDICINES: Did the doctor order any new medicines for you to use? If Yes, ask: Have you filled the prescription and started taking the medicine?      Augmentin   8. NEXT APPOINTMENT: Have you scheduled a follow-up appointment with your doctor?     No f/u needed  9. PAIN: Is there any pain? If Yes, ask: How bad is it?  (Scale 0-10; or mild, moderate, severe)    - NONE (0): no pain    - MILD (1-3): doesn't interfere with normal activities     - MODERATE (4-7): interferes with normal activities or awakens from sleep     - SEVERE (8-10): excruciating pain, unable to do any normal activities     *No Answer* 10. FEVER: Do you have a fever? If Yes, ask: What is it, how was it measured  and when did it start?       *No Answer* 11. OTHER SYMPTOMS: Do you have any other symptoms?       *No Answer*  Protocols used: Recent Medical Visit for Illness Follow-up Call-A-AH

## 2024-04-07 ENCOUNTER — Encounter (HOSPITAL_COMMUNITY): Admission: EM | Disposition: A | Discharge status: Home / Self Care | Payer: Self-pay | Source: Home / Self Care

## 2024-04-07 ENCOUNTER — Other Ambulatory Visit: Payer: Self-pay

## 2024-04-07 ENCOUNTER — Inpatient Hospital Stay (HOSPITAL_COMMUNITY)
Admission: EM | Admit: 2024-04-07 | Discharge: 2024-04-12 | DRG: 348 | Disposition: A | Discharge status: Home / Self Care | Payer: Self-pay | Attending: Surgery | Admitting: Surgery

## 2024-04-07 ENCOUNTER — Inpatient Hospital Stay (HOSPITAL_COMMUNITY): Payer: Self-pay | Admitting: Anesthesiology

## 2024-04-07 ENCOUNTER — Encounter (HOSPITAL_COMMUNITY): Payer: Self-pay

## 2024-04-07 ENCOUNTER — Emergency Department (HOSPITAL_COMMUNITY): Payer: Self-pay

## 2024-04-07 DIAGNOSIS — I10 Essential (primary) hypertension: Secondary | ICD-10-CM

## 2024-04-07 DIAGNOSIS — K611 Rectal abscess: Secondary | ICD-10-CM

## 2024-04-07 DIAGNOSIS — I2510 Atherosclerotic heart disease of native coronary artery without angina pectoris: Secondary | ICD-10-CM | POA: Diagnosis present

## 2024-04-07 DIAGNOSIS — B9620 Unspecified Escherichia coli [E. coli] as the cause of diseases classified elsewhere: Secondary | ICD-10-CM | POA: Diagnosis present

## 2024-04-07 DIAGNOSIS — F32A Depression, unspecified: Secondary | ICD-10-CM | POA: Diagnosis present

## 2024-04-07 DIAGNOSIS — L02215 Cutaneous abscess of perineum: Secondary | ICD-10-CM | POA: Diagnosis present

## 2024-04-07 DIAGNOSIS — E7800 Pure hypercholesterolemia, unspecified: Secondary | ICD-10-CM | POA: Diagnosis present

## 2024-04-07 DIAGNOSIS — K612 Anorectal abscess: Principal | ICD-10-CM | POA: Diagnosis present

## 2024-04-07 DIAGNOSIS — R112 Nausea with vomiting, unspecified: Secondary | ICD-10-CM | POA: Diagnosis present

## 2024-04-07 DIAGNOSIS — E10319 Type 1 diabetes mellitus with unspecified diabetic retinopathy without macular edema: Secondary | ICD-10-CM | POA: Diagnosis present

## 2024-04-07 DIAGNOSIS — I252 Old myocardial infarction: Secondary | ICD-10-CM

## 2024-04-07 DIAGNOSIS — Z794 Long term (current) use of insulin: Secondary | ICD-10-CM

## 2024-04-07 DIAGNOSIS — R339 Retention of urine, unspecified: Secondary | ICD-10-CM | POA: Diagnosis present

## 2024-04-07 DIAGNOSIS — A419 Sepsis, unspecified organism: Secondary | ICD-10-CM

## 2024-04-07 DIAGNOSIS — Z7984 Long term (current) use of oral hypoglycemic drugs: Secondary | ICD-10-CM

## 2024-04-07 DIAGNOSIS — E1010 Type 1 diabetes mellitus with ketoacidosis without coma: Secondary | ICD-10-CM

## 2024-04-07 DIAGNOSIS — H548 Legal blindness, as defined in USA: Secondary | ICD-10-CM | POA: Diagnosis present

## 2024-04-07 DIAGNOSIS — L0231 Cutaneous abscess of buttock: Principal | ICD-10-CM | POA: Diagnosis present

## 2024-04-07 DIAGNOSIS — R197 Diarrhea, unspecified: Secondary | ICD-10-CM | POA: Diagnosis present

## 2024-04-07 DIAGNOSIS — E1065 Type 1 diabetes mellitus with hyperglycemia: Secondary | ICD-10-CM | POA: Diagnosis present

## 2024-04-07 DIAGNOSIS — K610 Anal abscess: Secondary | ICD-10-CM

## 2024-04-07 DIAGNOSIS — K921 Melena: Secondary | ICD-10-CM | POA: Diagnosis not present

## 2024-04-07 DIAGNOSIS — Z1152 Encounter for screening for COVID-19: Secondary | ICD-10-CM

## 2024-04-07 DIAGNOSIS — Z603 Acculturation difficulty: Secondary | ICD-10-CM | POA: Diagnosis present

## 2024-04-07 DIAGNOSIS — Z91199 Patient's noncompliance with other medical treatment and regimen due to unspecified reason: Secondary | ICD-10-CM

## 2024-04-07 DIAGNOSIS — Z79899 Other long term (current) drug therapy: Secondary | ICD-10-CM

## 2024-04-07 DIAGNOSIS — L03317 Cellulitis of buttock: Secondary | ICD-10-CM | POA: Diagnosis present

## 2024-04-07 DIAGNOSIS — B3749 Other urogenital candidiasis: Secondary | ICD-10-CM | POA: Diagnosis present

## 2024-04-07 DIAGNOSIS — E113593 Type 2 diabetes mellitus with proliferative diabetic retinopathy without macular edema, bilateral: Secondary | ICD-10-CM

## 2024-04-07 DIAGNOSIS — E876 Hypokalemia: Secondary | ICD-10-CM | POA: Diagnosis present

## 2024-04-07 HISTORY — PX: IRRIGATION AND DEBRIDEMENT BUTTOCKS: SHX6601

## 2024-04-07 LAB — CBC WITH DIFFERENTIAL/PLATELET
Abs Immature Granulocytes: 0.38 K/uL — ABNORMAL HIGH (ref 0.00–0.07)
Basophils Absolute: 0.1 K/uL (ref 0.0–0.1)
Basophils Relative: 0 %
Eosinophils Absolute: 0 K/uL (ref 0.0–0.5)
Eosinophils Relative: 0 %
HCT: 34.6 % — ABNORMAL LOW (ref 39.0–52.0)
Hemoglobin: 12.1 g/dL — ABNORMAL LOW (ref 13.0–17.0)
Immature Granulocytes: 2 %
Lymphocytes Relative: 10 %
Lymphs Abs: 2.3 K/uL (ref 0.7–4.0)
MCH: 28.7 pg (ref 26.0–34.0)
MCHC: 35 g/dL (ref 30.0–36.0)
MCV: 82.2 fL (ref 80.0–100.0)
Monocytes Absolute: 1.7 K/uL — ABNORMAL HIGH (ref 0.1–1.0)
Monocytes Relative: 7 %
Neutro Abs: 18.4 K/uL — ABNORMAL HIGH (ref 1.7–7.7)
Neutrophils Relative %: 81 %
Platelets: 251 K/uL (ref 150–400)
RBC: 4.21 MIL/uL — ABNORMAL LOW (ref 4.22–5.81)
RDW: 11.8 % (ref 11.5–15.5)
WBC: 22.9 K/uL — ABNORMAL HIGH (ref 4.0–10.5)
nRBC: 0 % (ref 0.0–0.2)

## 2024-04-07 LAB — URINALYSIS, W/ REFLEX TO CULTURE (INFECTION SUSPECTED)
Bacteria, UA: NONE SEEN
Bilirubin Urine: NEGATIVE
Glucose, UA: >=500 mg/dL — AB
Ketones, ur: 5 mg/dL — AB
Leukocytes,Ua: NEGATIVE
Nitrite: NEGATIVE
Protein, ur: >=300 mg/dL — AB
Specific Gravity, Urine: 1.026 (ref 1.005–1.030)
pH: 5 (ref 5.0–8.0)

## 2024-04-07 LAB — MAGNESIUM: Magnesium: 2 mg/dL (ref 1.7–2.4)

## 2024-04-07 LAB — CBG MONITORING, ED: Glucose-Capillary: 175 mg/dL — ABNORMAL HIGH (ref 70–99)

## 2024-04-07 LAB — RESP PANEL BY RT-PCR (RSV, FLU A&B, COVID)  RVPGX2
Influenza A by PCR: NEGATIVE
Influenza B by PCR: NEGATIVE
Resp Syncytial Virus by PCR: NEGATIVE
SARS Coronavirus 2 by RT PCR: NEGATIVE

## 2024-04-07 LAB — PROTIME-INR
INR: 1 (ref 0.8–1.2)
Prothrombin Time: 14.2 s (ref 11.4–15.2)

## 2024-04-07 LAB — GLUCOSE, CAPILLARY
Glucose-Capillary: 184 mg/dL — ABNORMAL HIGH (ref 70–99)
Glucose-Capillary: 157 mg/dL — ABNORMAL HIGH (ref 70–99)
Glucose-Capillary: 173 mg/dL — ABNORMAL HIGH (ref 70–99)
Glucose-Capillary: 357 mg/dL — ABNORMAL HIGH (ref 70–99)

## 2024-04-07 LAB — COMPREHENSIVE METABOLIC PANEL WITH GFR
ALT: 13 U/L (ref 0–44)
AST: 16 U/L (ref 15–41)
Albumin: 2.6 g/dL — ABNORMAL LOW (ref 3.5–5.0)
Alkaline Phosphatase: 168 U/L — ABNORMAL HIGH (ref 38–126)
Anion gap: 10 (ref 5–15)
BUN: 23 mg/dL — ABNORMAL HIGH (ref 6–20)
CO2: 24 mmol/L (ref 22–32)
Calcium: 8.5 mg/dL — ABNORMAL LOW (ref 8.9–10.3)
Chloride: 98 mmol/L (ref 98–111)
Creatinine, Ser: 0.65 mg/dL (ref 0.61–1.24)
GFR, Estimated: >60 mL/min (ref >60)
Glucose, Bld: 201 mg/dL — ABNORMAL HIGH (ref 70–99)
Potassium: 2.9 mmol/L — ABNORMAL LOW (ref 3.5–5.1)
Sodium: 132 mmol/L — ABNORMAL LOW (ref 135–145)
Total Bilirubin: 1.4 mg/dL — ABNORMAL HIGH (ref 0.0–1.2)
Total Protein: 7.2 g/dL (ref 6.5–8.1)

## 2024-04-07 LAB — I-STAT CG4 LACTIC ACID, ED
Lactic Acid, Venous: 1 mmol/L (ref 0.5–1.9)
Lactic Acid, Venous: 0.9 mmol/L (ref 0.5–1.9)

## 2024-04-07 SURGERY — IRRIGATION AND DEBRIDEMENT BUTTOCKS
Anesthesia: General

## 2024-04-07 MED ORDER — ROCURONIUM BROMIDE 10 MG/ML (PF) SYRINGE
PREFILLED_SYRINGE | INTRAVENOUS | Status: AC
  Filled 2024-04-07: qty 10

## 2024-04-07 MED ORDER — SODIUM CHLORIDE 0.9 % IV SOLN
2.0000 g | Freq: Three times a day (TID) | INTRAVENOUS | Status: DC
  Administered 2024-04-07 – 2024-04-11 (×11): 2 g via INTRAVENOUS
  Filled 2024-04-07 (×12): qty 12.5

## 2024-04-07 MED ORDER — GABAPENTIN 300 MG PO CAPS
300.0000 mg | ORAL_CAPSULE | Freq: Every day | ORAL | Status: DC
  Administered 2024-04-07 – 2024-04-11 (×5): 300 mg via ORAL
  Filled 2024-04-07 (×5): qty 1

## 2024-04-07 MED ORDER — FENTANYL CITRATE PF 50 MCG/ML IJ SOSY
25.0000 ug | PREFILLED_SYRINGE | INTRAMUSCULAR | Status: DC | PRN
  Administered 2024-04-07 (×2): 50 ug via INTRAVENOUS

## 2024-04-07 MED ORDER — DEXAMETHASONE SODIUM PHOSPHATE 10 MG/ML IJ SOLN
INTRAMUSCULAR | Status: AC
Start: 2024-04-07 — End: 2024-04-07
  Filled 2024-04-07: qty 1

## 2024-04-07 MED ORDER — ONDANSETRON HCL 4 MG/2ML IJ SOLN: 4.0000 mg | Freq: Four times a day (QID) | INTRAMUSCULAR | Status: DC | PRN

## 2024-04-07 MED ORDER — LACTATED RINGERS IV SOLN: INTRAVENOUS | Status: DC

## 2024-04-07 MED ORDER — ONDANSETRON HCL 4 MG/2ML IJ SOLN
4.0000 mg | Freq: Once | INTRAMUSCULAR | Status: AC
  Administered 2024-04-07: 4 mg via INTRAVENOUS
  Filled 2024-04-07: qty 2

## 2024-04-07 MED ORDER — HYDROMORPHONE HCL 1 MG/ML IJ SOLN
INTRAMUSCULAR | Status: DC | PRN
  Administered 2024-04-07: 1 mg via INTRAVENOUS

## 2024-04-07 MED ORDER — SODIUM CHLORIDE 0.9 % IV SOLN
2.0000 g | Freq: Once | INTRAVENOUS | Status: AC
  Administered 2024-04-07: 2 g via INTRAVENOUS
  Filled 2024-04-07: qty 12.5

## 2024-04-07 MED ORDER — MORPHINE SULFATE (PF) 4 MG/ML IV SOLN
4.0000 mg | Freq: Once | INTRAVENOUS | Status: AC
  Administered 2024-04-07: 4 mg via INTRAVENOUS
  Filled 2024-04-07: qty 1

## 2024-04-07 MED ORDER — PHENYLEPHRINE 80 MCG/ML (10ML) SYRINGE FOR IV PUSH (FOR BLOOD PRESSURE SUPPORT)
PREFILLED_SYRINGE | INTRAVENOUS | Status: AC
  Filled 2024-04-07: qty 10

## 2024-04-07 MED ORDER — HYDROMORPHONE HCL 1 MG/ML IJ SOLN
0.5000 mg | Freq: Once | INTRAMUSCULAR | Status: AC
  Administered 2024-04-07: 0.5 mg via INTRAVENOUS
  Filled 2024-04-07: qty 1

## 2024-04-07 MED ORDER — SODIUM CHLORIDE 0.9 % IV BOLUS
1000.0000 mL | Freq: Once | INTRAVENOUS | Status: AC
  Administered 2024-04-07: 1000 mL via INTRAVENOUS

## 2024-04-07 MED ORDER — ENOXAPARIN SODIUM 40 MG/0.4ML IJ SOSY
40.0000 mg | PREFILLED_SYRINGE | INTRAMUSCULAR | Status: DC
  Administered 2024-04-08 – 2024-04-12 (×5): 40 mg via SUBCUTANEOUS
  Filled 2024-04-07 (×6): qty 0.4

## 2024-04-07 MED ORDER — OXYCODONE HCL 5 MG PO TABS: 5.0000 mg | ORAL_TABLET | Freq: Once | ORAL | Status: DC | PRN

## 2024-04-07 MED ORDER — METHOCARBAMOL 1000 MG/10ML IJ SOLN: 500.0000 mg | Freq: Three times a day (TID) | INTRAMUSCULAR | Status: DC | PRN

## 2024-04-07 MED ORDER — VANCOMYCIN HCL IN DEXTROSE 1-5 GM/200ML-% IV SOLN
1000.0000 mg | Freq: Two times a day (BID) | INTRAVENOUS | Status: DC
  Administered 2024-04-08 – 2024-04-09 (×3): 1000 mg via INTRAVENOUS
  Filled 2024-04-07 (×4): qty 200

## 2024-04-07 MED ORDER — PHENYLEPHRINE HCL (PRESSORS) 10 MG/ML IV SOLN
INTRAVENOUS | Status: DC | PRN
  Administered 2024-04-07: 160 ug via INTRAVENOUS

## 2024-04-07 MED ORDER — OXYCODONE HCL 5 MG PO TABS
5.0000 mg | ORAL_TABLET | ORAL | Status: DC | PRN
  Administered 2024-04-07: 10 mg via ORAL
  Administered 2024-04-08 (×4): 5 mg via ORAL
  Administered 2024-04-09 – 2024-04-10 (×6): 10 mg via ORAL
  Administered 2024-04-11 – 2024-04-12 (×3): 5 mg via ORAL
  Filled 2024-04-07: qty 1
  Filled 2024-04-07 (×5): qty 2
  Filled 2024-04-07: qty 1
  Filled 2024-04-07: qty 2
  Filled 2024-04-07: qty 1
  Filled 2024-04-07: qty 2
  Filled 2024-04-07 (×4): qty 1

## 2024-04-07 MED ORDER — SIMETHICONE 80 MG PO CHEW: 40.0000 mg | CHEWABLE_TABLET | Freq: Four times a day (QID) | ORAL | Status: DC | PRN

## 2024-04-07 MED ORDER — BUPIVACAINE-EPINEPHRINE (PF) 0.5% -1:200000 IJ SOLN
INTRAMUSCULAR | Status: AC
  Filled 2024-04-07: qty 30

## 2024-04-07 MED ORDER — ONDANSETRON HCL 4 MG/2ML IJ SOLN
INTRAMUSCULAR | Status: AC
  Filled 2024-04-07: qty 2

## 2024-04-07 MED ORDER — FENTANYL CITRATE (PF) 100 MCG/2ML IJ SOLN
INTRAMUSCULAR | Status: AC
  Filled 2024-04-07: qty 2

## 2024-04-07 MED ORDER — KETOROLAC TROMETHAMINE 15 MG/ML IJ SOLN
15.0000 mg | Freq: Once | INTRAMUSCULAR | Status: AC
  Administered 2024-04-07: 15 mg via INTRAVENOUS
  Filled 2024-04-07: qty 1

## 2024-04-07 MED ORDER — PROPOFOL 10 MG/ML IV BOLUS
INTRAVENOUS | Status: AC
  Filled 2024-04-07: qty 20

## 2024-04-07 MED ORDER — FENTANYL CITRATE (PF) 250 MCG/5ML IJ SOLN
INTRAMUSCULAR | Status: DC | PRN
  Administered 2024-04-07 (×2): 50 ug via INTRAVENOUS

## 2024-04-07 MED ORDER — INSULIN ASPART 100 UNIT/ML IJ SOLN
0.0000 [IU] | Freq: Three times a day (TID) | INTRAMUSCULAR | Status: DC
  Administered 2024-04-08 (×3): 15 [IU] via SUBCUTANEOUS
  Administered 2024-04-09 (×2): 11 [IU] via SUBCUTANEOUS

## 2024-04-07 MED ORDER — MIDAZOLAM HCL 2 MG/2ML IJ SOLN
INTRAMUSCULAR | Status: DC | PRN
  Administered 2024-04-07: 2 mg via INTRAVENOUS

## 2024-04-07 MED ORDER — OXYCODONE HCL 5 MG PO TABS
5.0000 mg | ORAL_TABLET | Freq: Four times a day (QID) | ORAL | Status: DC | PRN
  Filled 2024-04-07: qty 1

## 2024-04-07 MED ORDER — ACETAMINOPHEN 500 MG PO TABS: 1000.0000 mg | ORAL_TABLET | Freq: Once | ORAL | Status: DC

## 2024-04-07 MED ORDER — ROCURONIUM 10MG/ML (10ML) SYRINGE FOR MEDFUSION PUMP - OPTIME
INTRAVENOUS | Status: DC | PRN
  Administered 2024-04-07: 40 mg via INTRAVENOUS

## 2024-04-07 MED ORDER — DEXAMETHASONE SODIUM PHOSPHATE 10 MG/ML IJ SOLN
INTRAMUSCULAR | Status: AC
  Filled 2024-04-07: qty 1

## 2024-04-07 MED ORDER — SUGAMMADEX SODIUM 200 MG/2ML IV SOLN
INTRAVENOUS | Status: DC | PRN
  Administered 2024-04-07: 200 mg via INTRAVENOUS

## 2024-04-07 MED ORDER — METOPROLOL TARTRATE 5 MG/5ML IV SOLN: 5.0000 mg | Freq: Four times a day (QID) | INTRAVENOUS | Status: DC | PRN

## 2024-04-07 MED ORDER — POTASSIUM CHLORIDE CRYS ER 20 MEQ PO TBCR
40.0000 meq | EXTENDED_RELEASE_TABLET | Freq: Once | ORAL | Status: AC
  Administered 2024-04-07: 40 meq via ORAL
  Filled 2024-04-07: qty 2

## 2024-04-07 MED ORDER — DEXAMETHASONE SODIUM PHOSPHATE 10 MG/ML IJ SOLN
INTRAMUSCULAR | Status: DC | PRN
  Administered 2024-04-07: 10 mg via INTRAVENOUS

## 2024-04-07 MED ORDER — POLYETHYLENE GLYCOL 3350 17 G PO PACK: 17.0000 g | PACK | Freq: Every day | ORAL | Status: DC | PRN

## 2024-04-07 MED ORDER — IOHEXOL 300 MG/ML  SOLN
100.0000 mL | Freq: Once | INTRAMUSCULAR | Status: AC | PRN
  Administered 2024-04-07: 100 mL via INTRAVENOUS

## 2024-04-07 MED ORDER — VANCOMYCIN HCL IN DEXTROSE 1-5 GM/200ML-% IV SOLN
1000.0000 mg | Freq: Once | INTRAVENOUS | Status: AC
  Administered 2024-04-07: 1000 mg via INTRAVENOUS
  Filled 2024-04-07: qty 200

## 2024-04-07 MED ORDER — INSULIN GLARGINE-YFGN 100 UNIT/ML ~~LOC~~ SOLN
15.0000 [IU] | Freq: Two times a day (BID) | SUBCUTANEOUS | Status: DC
  Administered 2024-04-07 – 2024-04-09 (×4): 15 [IU] via SUBCUTANEOUS
  Filled 2024-04-07 (×5): qty 0.15

## 2024-04-07 MED ORDER — HYDROMORPHONE HCL 1 MG/ML IJ SOLN
1.0000 mg | INTRAMUSCULAR | Status: DC | PRN
  Administered 2024-04-09 – 2024-04-10 (×2): 1 mg via INTRAVENOUS
  Filled 2024-04-07 (×2): qty 1

## 2024-04-07 MED ORDER — FENTANYL CITRATE PF 50 MCG/ML IJ SOSY
PREFILLED_SYRINGE | INTRAMUSCULAR | Status: AC
  Filled 2024-04-07: qty 2

## 2024-04-07 MED ORDER — MIDAZOLAM HCL 2 MG/2ML IJ SOLN
INTRAMUSCULAR | Status: AC
  Filled 2024-04-07: qty 2

## 2024-04-07 MED ORDER — ACETAMINOPHEN 325 MG PO TABS
650.0000 mg | ORAL_TABLET | Freq: Four times a day (QID) | ORAL | Status: DC | PRN
Start: 2024-04-07 — End: 2024-04-07

## 2024-04-07 MED ORDER — INSULIN ASPART 100 UNIT/ML IJ SOLN
INTRAMUSCULAR | Status: AC
  Filled 2024-04-07: qty 1

## 2024-04-07 MED ORDER — 0.9 % SODIUM CHLORIDE (POUR BTL) OPTIME
TOPICAL | Status: DC | PRN
  Administered 2024-04-07: 1000 mL

## 2024-04-07 MED ORDER — ACETAMINOPHEN 500 MG PO TABS
1000.0000 mg | ORAL_TABLET | Freq: Once | ORAL | Status: AC
  Administered 2024-04-07: 1000 mg via ORAL
  Filled 2024-04-07: qty 2

## 2024-04-07 MED ORDER — OXYCODONE HCL 5 MG/5ML PO SOLN: 5.0000 mg | Freq: Once | ORAL | Status: DC | PRN

## 2024-04-07 MED ORDER — METHOCARBAMOL 500 MG PO TABS
500.0000 mg | ORAL_TABLET | Freq: Three times a day (TID) | ORAL | Status: DC | PRN
Start: 2024-04-07 — End: 2024-04-12
  Administered 2024-04-07 – 2024-04-11 (×5): 500 mg via ORAL
  Filled 2024-04-07 (×5): qty 1

## 2024-04-07 MED ORDER — LISINOPRIL 5 MG PO TABS
2.5000 mg | ORAL_TABLET | Freq: Every day | ORAL | Status: DC
  Administered 2024-04-07 – 2024-04-12 (×6): 2.5 mg via ORAL
  Filled 2024-04-07 (×6): qty 1

## 2024-04-07 MED ORDER — LIDOCAINE HCL (PF) 2 % IJ SOLN
INTRAMUSCULAR | Status: AC
  Filled 2024-04-07: qty 5

## 2024-04-07 MED ORDER — DROPERIDOL 2.5 MG/ML IJ SOLN: 0.6250 mg | Freq: Once | INTRAMUSCULAR | Status: DC | PRN

## 2024-04-07 MED ORDER — DIPHENHYDRAMINE HCL 25 MG PO CAPS: 25.0000 mg | ORAL_CAPSULE | Freq: Four times a day (QID) | ORAL | Status: DC | PRN

## 2024-04-07 MED ORDER — ACETAMINOPHEN 500 MG PO TABS
1000.0000 mg | ORAL_TABLET | Freq: Four times a day (QID) | ORAL | Status: DC
  Administered 2024-04-07 – 2024-04-12 (×17): 1000 mg via ORAL
  Filled 2024-04-07 (×17): qty 2

## 2024-04-07 MED ORDER — BISACODYL 5 MG PO TBEC
5.0000 mg | DELAYED_RELEASE_TABLET | Freq: Every day | ORAL | Status: DC | PRN
  Administered 2024-04-10: 5 mg via ORAL
  Filled 2024-04-07: qty 1

## 2024-04-07 MED ORDER — ONDANSETRON HCL 4 MG/2ML IJ SOLN
INTRAMUSCULAR | Status: DC | PRN
  Administered 2024-04-07: 4 mg via INTRAVENOUS

## 2024-04-07 MED ORDER — ATORVASTATIN CALCIUM 20 MG PO TABS
20.0000 mg | ORAL_TABLET | Freq: Every day | ORAL | Status: DC
  Administered 2024-04-07 – 2024-04-12 (×6): 20 mg via ORAL
  Filled 2024-04-07 (×6): qty 1

## 2024-04-07 MED ORDER — BUPIVACAINE-EPINEPHRINE 0.5% -1:200000 IJ SOLN
INTRAMUSCULAR | Status: DC | PRN
  Administered 2024-04-07: 30 mL

## 2024-04-07 MED ORDER — DIPHENHYDRAMINE HCL 50 MG/ML IJ SOLN: 25.0000 mg | Freq: Four times a day (QID) | INTRAMUSCULAR | Status: DC | PRN

## 2024-04-07 MED ORDER — KETOROLAC TROMETHAMINE 15 MG/ML IJ SOLN: 15.0000 mg | Freq: Once | INTRAMUSCULAR | Status: DC

## 2024-04-07 MED ORDER — ONDANSETRON HCL 4 MG PO TABS
4.0000 mg | ORAL_TABLET | Freq: Four times a day (QID) | ORAL | Status: DC | PRN
  Administered 2024-04-12: 4 mg via ORAL
  Filled 2024-04-07: qty 1

## 2024-04-07 MED ORDER — PROPOFOL 10 MG/ML IV BOLUS
INTRAVENOUS | Status: DC | PRN
  Administered 2024-04-07: 100 mg via INTRAVENOUS

## 2024-04-07 MED ORDER — HYDROMORPHONE HCL 2 MG/ML IJ SOLN
INTRAMUSCULAR | Status: AC
  Filled 2024-04-07: qty 1

## 2024-04-07 MED ORDER — INSULIN ASPART 100 UNIT/ML IJ SOLN
0.0000 [IU] | INTRAMUSCULAR | Status: DC | PRN
  Administered 2024-04-07: 4 [IU] via SUBCUTANEOUS

## 2024-04-07 MED ORDER — ACETAMINOPHEN 650 MG RE SUPP: 650.0000 mg | Freq: Four times a day (QID) | RECTAL | Status: DC | PRN

## 2024-04-07 MED ORDER — LIDOCAINE 2% (20 MG/ML) 5 ML SYRINGE
INTRAMUSCULAR | Status: DC | PRN
  Administered 2024-04-07: 60 mg via INTRAVENOUS

## 2024-04-07 MED ORDER — CARVEDILOL 3.125 MG PO TABS
3.1250 mg | ORAL_TABLET | Freq: Two times a day (BID) | ORAL | Status: DC
  Administered 2024-04-07 – 2024-04-12 (×10): 3.125 mg via ORAL
  Filled 2024-04-07 (×10): qty 1

## 2024-04-07 SURGICAL SUPPLY — 28 items
BAG COUNTER SPONGE SURGICOUNT (BAG) IMPLANT
BLADE SURG 15 STRL LF DISP TIS (BLADE) ×1 IMPLANT
BNDG GAUZE DERMACEA FLUFF 4 (GAUZE/BANDAGES/DRESSINGS) IMPLANT
BNDG STRETCH 4X75 STRL LF (GAUZE/BANDAGES/DRESSINGS) IMPLANT
COVER SURGICAL LIGHT HANDLE (MISCELLANEOUS) ×1 IMPLANT
DRAIN PENROSE 0.25X18 (DRAIN) IMPLANT
DRAPE LAPAROSCOPIC ABDOMINAL (DRAPES) IMPLANT
ELECT REM PT RETURN 15FT ADLT (MISCELLANEOUS) ×1 IMPLANT
GAUZE PAD ABD 8X10 STRL (GAUZE/BANDAGES/DRESSINGS) IMPLANT
GAUZE SPONGE 4X4 12PLY STRL (GAUZE/BANDAGES/DRESSINGS) IMPLANT
GLOVE BIO SURGEON STRL SZ7.5 (GLOVE) ×1 IMPLANT
GOWN STRL REUS W/ TWL XL LVL3 (GOWN DISPOSABLE) IMPLANT
KIT BASIN OR (CUSTOM PROCEDURE TRAY) ×1 IMPLANT
KIT TURNOVER KIT A (KITS) ×1 IMPLANT
NDL HYPO 22X1.5 SAFETY MO (MISCELLANEOUS) IMPLANT
NEEDLE HYPO 22X1.5 SAFETY MO (MISCELLANEOUS) IMPLANT
NS IRRIG 1000ML POUR BTL (IV SOLUTION) ×1 IMPLANT
PACK BASIC VI WITH GOWN DISP (CUSTOM PROCEDURE TRAY) ×1 IMPLANT
PENCIL SMOKE EVACUATOR (MISCELLANEOUS) IMPLANT
SPIKE FLUID TRANSFER (MISCELLANEOUS) IMPLANT
SUT ETHILON 2 0 PS N (SUTURE) IMPLANT
SUT MNCRL AB 4-0 PS2 18 (SUTURE) IMPLANT
SUT VIC AB 3-0 SH 27XBRD (SUTURE) IMPLANT
SWAB COLLECTION DEVICE MRSA (MISCELLANEOUS) IMPLANT
SWAB CULTURE ESWAB REG 1ML (MISCELLANEOUS) IMPLANT
SYR 20ML LL LF (SYRINGE) IMPLANT
TOWEL OR 17X26 10 PK STRL BLUE (TOWEL DISPOSABLE) ×1 IMPLANT
YANKAUER SUCT BULB TIP NO VENT (SUCTIONS) ×1 IMPLANT

## 2024-04-07 NOTE — ED Notes (Signed)
 Patient taken to CT.

## 2024-04-07 NOTE — ED Provider Notes (Signed)
 Signout received on this patient pending CT scan and discussion with surgery and potential admission. Physical Exam  BP 131/73   Pulse 83   Temp 98.6 F (37 C) (Oral)   Resp 16   Ht 5' 5 (1.651 m)   Wt 65.8 kg   SpO2 98%   BMI 24.13 kg/m     Procedures  Procedures  ED Course / MDM    Medical Decision Making Amount and/or Complexity of Data Reviewed Labs: ordered. Radiology: ordered.  Risk OTC drugs. Prescription drug management.   Discussed with surgery.  They recommend medicine admission and they will consult. Discussed with hospitalist.  They will evaluate patient for admission.       Hildegard Loge, PA-C 04/07/24 [id number]    Dreama Longs, MD 04/08/24 [phone]

## 2024-04-07 NOTE — ED Notes (Signed)
 Pt attempted to give urine sample with assistance from this tech but was having a hard time

## 2024-04-07 NOTE — Op Note (Signed)
   Drew [name] 04/07/2024   Pre-op Diagnosis: Perianal abscess     Post-op Diagnosis: same   Procedure(s): INCISION AND DRAINAGE PERIANAL ABSCESS  Surgeon(s): Vernetta Berg, MD Britta Anna, MD [facility] Resident  Anesthesia: General  Staff:  Circulator: Gretta Leonor CROME, RN Scrub Person: Delores Raring; Dyana Powell CROME, RN; Velva, [place] M, CST Circulator Assistant: Harvey Almarie RAMAN, RN  Estimated Blood Loss: Minimal               Findings: The patient was found to have a large left-sided perianal abscess.  Cultures were sent  Procedure: The patient was brought to the operating identified the correct patient.  He was placed supine on the operating room table and general anesthesia was induced.  The patient was next placed in the lithotomy position.  His perineum and buttocks were then prepped and draped in the usual sterile fashion.  He had a previous old scar in the area of induration along the left perianal area.  We made an incision over this area with a scalpel and entered a superficial abscess cavity.  We then probed further with a Kelly clamp and identified a larger abscess cavity containing a large amount of purulence.  Cultures were sent.  We made the skin incision larger with the cautery.  The cavity did not appear to communicate with the anus and digital examination of the anus revealed no lesions.  We then copiously irrigated the abscess cavity with saline.  We made a counterincision lateral to this with a scalpel and then placed 1/4 inch Penrose drain through the separate incision and into the wound and back out again.  The drain was sewn to itself with a 2-0 nylon suture.  The wound and abscess cavity were then anesthetized with Marcaine .  The open wound was then packed with a saline soaked 2 inch Kling dressing.  Dry gauze and ABD were placed over this.  The patient tolerated the procedure well.  All the counts were correct at the end of the procedure.  The  patient was then extubated in the operating room and taken in a stable condition to the recovery room.          Berg Vernetta   Date: 04/07/2024  Time: 3:12 PM

## 2024-04-07 NOTE — H&P (Signed)
 Drew [name] is an 48 y.o. male.   Chief Complaint: Perianal pain HPI: This is a 48 year old Hispanic gentleman with type 1 diabetes who presents with a large left renal abscess.  Apparently has had incision and drainage elsewhere in the past.  He was recently seen elsewhere and placed on oral antibiotics 2 days ago but because of worsening discomfort presents to the Emergency Department here.  He does not speak [place].  He is also blind.  He presented with urinary retention as well and a Foley catheter has been placed.  He reports no current drainage from the wound but just severe pain in his left perineum  Past Medical History:  Diagnosis Date   Abscess, gluteal, left    Coronary artery disease    Diabetic retinopathy (HCC)    Hyperlipidemia associated with type 2 diabetes mellitus (HCC)    Hypertension    Type 2 diabetes mellitus (HCC)     History reviewed. No pertinent surgical history.  History reviewed. No pertinent family history. Social History:  reports that he has never smoked. He has never used smokeless tobacco. He reports that he does not drink alcohol and does not use drugs.  Allergies: No Known Allergies  (Not in a hospital admission)   Results for orders placed or performed during the hospital encounter of 04/07/24 (from the past 48 hours)  Resp panel by RT-PCR (RSV, Flu A&B, Covid) Anterior Nasal Swab     Status: None   Collection Time: 04/07/24  4:21 AM   Specimen: Anterior Nasal Swab  Result Value Ref Range   SARS Coronavirus 2 by RT PCR NEGATIVE NEGATIVE    Comment: (NOTE) SARS-CoV-2 target nucleic acids are NOT DETECTED.  The SARS-CoV-2 RNA is generally detectable in upper respiratory specimens during the acute phase of infection. The lowest concentration of SARS-CoV-2 viral copies this assay can detect is 138 copies/mL. A negative result does not preclude SARS-Cov-2 infection and should not be used as the sole basis for treatment or other patient  management decisions. A negative result may occur with  improper specimen collection/handling, submission of specimen other than nasopharyngeal swab, presence of viral mutation(s) within the areas targeted by this assay, and inadequate number of viral copies(<138 copies/mL). A negative result must be combined with clinical observations, patient history, and epidemiological information. The expected result is Negative.  Fact Sheet for Patients:  BloggerCourse.com  Fact Sheet for Healthcare Providers:  SeriousBroker.it  This test is no t yet approved or cleared by the [place]  FDA and  has been authorized for detection and/or diagnosis of SARS-CoV-2 by FDA under an Emergency Use Authorization (EUA). This EUA will remain  in effect (meaning this test can be used) for the duration of the COVID-19 declaration under Section 564(b)(1) of the Act, 21 U.S.C.section 360bbb-3(b)(1), unless the authorization is terminated  or revoked sooner.       Influenza A by PCR NEGATIVE NEGATIVE   Influenza B by PCR NEGATIVE NEGATIVE    Comment: (NOTE) The Xpert Xpress SARS-CoV-2/FLU/RSV plus assay is intended as an aid in the diagnosis of influenza from Nasopharyngeal swab specimens and should not be used as a sole basis for treatment. Nasal washings and aspirates are unacceptable for Xpert Xpress SARS-CoV-2/FLU/RSV testing.  Fact Sheet for Patients: BloggerCourse.com  Fact Sheet for Healthcare Providers: SeriousBroker.it  This test is not yet approved or cleared by the [place]  FDA and has been authorized for detection and/or diagnosis of SARS-CoV-2 by FDA under an Emergency Use Authorization (  EUA). This EUA will remain in effect (meaning this test can be used) for the duration of the COVID-19 declaration under Section 564(b)(1) of the Act, 21 U.S.C. section 360bbb-3(b)(1), unless the  authorization is terminated or revoked.     Resp Syncytial Virus by PCR NEGATIVE NEGATIVE    Comment: (NOTE) Fact Sheet for Patients: BloggerCourse.com  Fact Sheet for Healthcare Providers: SeriousBroker.it  This test is not yet approved or cleared by the [place]  FDA and has been authorized for detection and/or diagnosis of SARS-CoV-2 by FDA under an Emergency Use Authorization (EUA). This EUA will remain in effect (meaning this test can be used) for the duration of the COVID-19 declaration under Section 564(b)(1) of the Act, 21 U.S.C. section 360bbb-3(b)(1), unless the authorization is terminated or revoked.  Performed at [facility], [address], [place], [place] [address]   Comprehensive metabolic panel     Status: Abnormal   Collection Time: 04/07/24  4:35 AM  Result Value Ref Range   Sodium 132 (L) 135 - 145 mmol/L   Potassium 2.9 (L) 3.5 - 5.1 mmol/L   Chloride 98 98 - 111 mmol/L   CO2 24 22 - 32 mmol/L   Glucose, Bld 201 (H) 70 - 99 mg/dL    Comment: Glucose reference range applies only to samples taken after fasting for at least 8 hours.   BUN 23 (H) 6 - 20 mg/dL   Creatinine, Ser 9.34 0.61 - 1.24 mg/dL   Calcium  8.5 (L) 8.9 - 10.3 mg/dL   Total Protein 7.2 6.5 - 8.1 g/dL   Albumin 2.6 (L) 3.5 - 5.0 g/dL   AST 16 15 - 41 U/L   ALT 13 0 - 44 U/L   Alkaline Phosphatase 168 (H) 38 - 126 U/L   Total Bilirubin 1.4 (H) 0.0 - 1.2 mg/dL   GFR, Estimated >39 >39 mL/min    Comment: (NOTE) Calculated using the CKD-EPI Creatinine Equation (2021)    Anion gap 10 5 - 15    Comment: Performed at [facility], [address] [place], [place] [address]  CBC with Differential     Status: Abnormal   Collection Time: 04/07/24  4:35 AM  Result Value Ref Range   WBC 22.9 (H) 4.0 - 10.5 K/uL   RBC 4.21 (L) 4.22 - 5.81 MIL/uL   Hemoglobin 12.1 (L) 13.0 - 17.0 g/dL   HCT 65.3 (L) 60.9  - 52.0 %   MCV 82.2 80.0 - 100.0 fL   MCH 28.7 26.0 - 34.0 pg   MCHC 35.0 30.0 - 36.0 g/dL   RDW 88.1 88.4 - 84.4 %   Platelets 251 150 - 400 K/uL   nRBC 0.0 0.0 - 0.2 %   Neutrophils Relative % 81 %   Neutro Abs 18.4 (H) 1.7 - 7.7 K/uL   Lymphocytes Relative 10 %   Lymphs Abs 2.3 0.7 - 4.0 K/uL   Monocytes Relative 7 %   Monocytes Absolute 1.7 (H) 0.1 - 1.0 K/uL   Eosinophils Relative 0 %   Eosinophils Absolute 0.0 0.0 - 0.5 K/uL   Basophils Relative 0 %   Basophils Absolute 0.1 0.0 - 0.1 K/uL   WBC Morphology MORPHOLOGY UNREMARKABLE    RBC Morphology MORPHOLOGY UNREMARKABLE    Smear Review MORPHOLOGY UNREMARKABLE    Immature Granulocytes 2 %   Abs Immature Granulocytes 0.38 (H) 0.00 - 0.07 K/uL    Comment: Performed at [facility], [address] [place],  [place] [address]  Protime-INR     Status: None   Collection Time: 04/07/24  4:35 AM  Result Value Ref Range   Prothrombin Time 14.2 11.4 - 15.2 seconds   INR 1.0 0.8 - 1.2    Comment: (NOTE) INR goal varies based on device and disease states. Performed at [facility], [address], [place], [place] [address]   Blood Culture (routine x 2)     Status: None (Preliminary result)   Collection Time: 04/07/24  4:35 AM   Specimen: BLOOD RIGHT FOREARM  Result Value Ref Range   Specimen Description      BLOOD RIGHT FOREARM Performed at [facility], [address], [place], [place] [address]    Special Requests      BOTTLES DRAWN AEROBIC AND ANAEROBIC Blood Culture adequate volume Performed at [facility], [address], [place], [place] [address]    Culture      NO GROWTH < 12 HOURS Performed at [facility], [address], [place], [place] [address]    Report Status PENDING   Blood Culture (routine x 2)     Status: None (Preliminary result)   Collection Time: 04/07/24  4:35 AM   Specimen: BLOOD LEFT FOREARM  Result Value Ref Range   Specimen  Description      BLOOD LEFT FOREARM Performed at [facility], [address], [place], [place] [address]    Special Requests      BOTTLES DRAWN AEROBIC AND ANAEROBIC Blood Culture adequate volume Performed at [facility], [address] [place], [place] [address]    Culture      NO GROWTH < 12 HOURS Performed at [facility], [address], [place], [place] [address]    Report Status PENDING   Magnesium     Status: None   Collection Time: 04/07/24  4:35 AM  Result Value Ref Range   Magnesium 2.0 1.7 - 2.4 mg/dL    Comment: Performed at [facility], [address] [place], [place] [address]  I-Stat Lactic Acid, ED     Status: None   Collection Time: 04/07/24  4:42 AM  Result Value Ref Range   Lactic Acid, Venous 1.0 0.5 - 1.9 mmol/L  Urinalysis, w/ Reflex to Culture (Infection Suspected) -Urine, Clean Catch     Status: Abnormal   Collection Time: 04/07/24  5:20 AM  Result Value Ref Range   Specimen Source URINE, CLEAN CATCH    Color, Urine YELLOW YELLOW   APPearance HAZY (A) CLEAR   Specific Gravity, Urine 1.026 1.005 - 1.030   pH 5.0 5.0 - 8.0   Glucose, UA >=500 (A) NEGATIVE mg/dL   Hgb urine dipstick SMALL (A) NEGATIVE   Bilirubin Urine NEGATIVE NEGATIVE   Ketones, ur 5 (A) NEGATIVE mg/dL   Protein, ur >=699 (A) NEGATIVE mg/dL   Nitrite NEGATIVE NEGATIVE   Leukocytes,Ua NEGATIVE NEGATIVE   RBC / HPF 6-10 0 - 5 RBC/hpf   WBC, UA 0-5 0 - 5 WBC/hpf    Comment:        Reflex urine culture not performed if WBC <=10, OR if Squamous epithelial cells >5. If Squamous epithelial cells >5 suggest recollection.    Bacteria, UA NONE SEEN NONE SEEN   Squamous Epithelial / HPF 0-5 0 - 5 /HPF   Mucus PRESENT    Hyaline Casts, UA PRESENT     Comment: Performed at [facility], [address], [place], [place] [address]  I-Stat Lactic Acid, ED     Status: None   Collection Time: 04/07/24  6:38 AM  Result  Value Ref Range   Lactic Acid, Venous 0.9 0.5 - 1.9 mmol/L   CT ABDOMEN PELVIS W CONTRAST Result Date: 04/07/2024 EXAM: CT ABDOMEN AND PELVIS WITH CONTRAST 04/07/2024 06:34:07 AM TECHNIQUE: CT of the abdomen and pelvis was performed with the administration of intravenous contrast. Multiplanar reformatted images are provided for review. Automated exposure control, iterative reconstruction, and/or weight based adjustment of the mA/kV was utilized to reduce the radiation dose to as low as reasonably achievable. COMPARISON: CT of the abdomen and pelvis with contrast 08/21/2023. CLINICAL HISTORY: Abdominal pain, acute, nonlocalized; Perineal abscess. Abscess on the buttocks started 2 weeks ago. Urinary retention started yesterday, has only urinated once yesterday morning, vomiting started last week and is persistent. Was given an antibiotic for it yesterday morning and it has been making him sick, WBC's 22.9, GFR>60. FINDINGS: LOWER CHEST: Mild dependent atelectasis is present at the lung bases bilaterally. LIVER: Diffuse fatty infiltration of the liver is present. No focal lesions are present. GALLBLADDER AND BILE DUCTS: Gallbladder is unremarkable. No biliary ductal dilatation. SPLEEN: No acute abnormality. PANCREAS: No acute abnormality. ADRENAL GLANDS: No acute abnormality. KIDNEYS, URETERS AND BLADDER: No stones in the kidneys or ureters. No hydronephrosis. No perinephric or periureteral stranding. A Foley catheter is present within the urinary bladder. GI AND BOWEL: Stomach demonstrates no acute abnormality. There is no bowel obstruction. No bowel wall thickening. The distal sigmoid colon and rectum are otherwise unremarkable. PERITONEUM AND RETROPERITONEUM: No ascites. No free air. Diffuse left perirectal inflammatory changes are present along the left gluteal fold measuring 7.9 x 6.1 x 7.5 cm. No discrete fluid collection is present. No fistulous tract is evident. VASCULATURE: Atherosclerotic changes are  present in the aorta and branch vessels without aneurysm. LYMPH NODES: No lymphadenopathy. REPRODUCTIVE ORGANS: No acute abnormality. BONES AND SOFT TISSUES: No acute osseous abnormality. No focal soft tissue abnormality. IMPRESSION: 1. Diffuse left perirectal inflammatory changes along the left gluteal fold measuring 7.9 x 6.1 x 7.5 cm, without discrete fluid collection or fistulous tract. This likely reflects residual or recurrent infection. Electronically signed by: Lonni Necessary MD 04/07/2024 06:43 AM EDT RP Workstation: [id number]   DG Chest Port 1 View Result Date: 04/07/2024 EXAM: 1 VIEW XRAY OF THE CHEST 04/07/2024 04:45:00 AM COMPARISON: 2 view chest x-ray 06/16/2023. CLINICAL HISTORY: Questionable sepsis - evaluate for abnormality. ? Sepsis, abscess to buttocks, fever, vomiting, urinary retention. FINDINGS: LUNGS AND PLEURA: Lung volumes are low. No focal pulmonary opacity. No pulmonary edema. No pleural effusion. No pneumothorax. HEART AND MEDIASTINUM: No acute abnormality of the cardiac and mediastinal silhouettes. BONES AND SOFT TISSUES: No acute osseous abnormality. IMPRESSION: 1. No acute process. 2. Low lung volumes. Electronically signed by: Lonni Necessary MD 04/07/2024 05:01 AM EDT RP Workstation: [id number]    Review of Systems  Constitutional:  Negative for chills and fever.  Respiratory:  Negative for chest tightness and shortness of breath.   Cardiovascular:  Negative for chest pain.  Genitourinary:  Positive for difficulty urinating.  All other systems reviewed and are negative.   Blood pressure 131/73, pulse 83, temperature 98.6 F (37 C), temperature source Oral, resp. rate 16, height 5' 5 (1.651 m), weight 65.8 kg, SpO2 98%. Physical Exam Constitutional:      Appearance: He is ill-appearing. He is not toxic-appearing or diaphoretic.  HENT:  Head: Normocephalic and atraumatic.     Right Ear: External ear normal.     Left Ear: External ear normal.      Nose: Nose normal.  Eyes:     Comments: Patient is blind  Cardiovascular:     Rate and Rhythm: Normal rate and regular rhythm.     Pulses: Normal pulses.     Heart sounds: Normal heart sounds.  Pulmonary:     Effort: Pulmonary effort is normal. No respiratory distress.     Breath sounds: Normal breath sounds.  Abdominal:     General: Abdomen is flat.     Palpations: Abdomen is soft.     Tenderness: There is no abdominal tenderness.  Genitourinary:    Comments: There is significant swelling and induration of the left perianal area going toward the buttocks consistent with a deep abscess.  There are no open wounds visible with him frog-legged Skin:    General: Skin is warm and dry.  Neurological:     Mental Status: He is alert.      Assessment/Plan Large perianal abscess  I discussed the diagnosis with the patient through interpreter.  Incision and drainage in the operating room is strongly recommended.  Again he is a poorly controlled diabetic so we will ask the hospitalist to consult on the patient postoperatively to help with medical management.  I explained the surgical procedure to him in detail.  We discussed the risks which includes but is not limited to bleeding, ongoing infection, the need for further procedures, cardiopulmonary issues or surgery, etc.  He currently has a Foley catheter in place as his urinary retention is likely secondary to the ongoing infection.  We will likely the Foley several days after surgery and address the retention if it persists after control of his infection and diabetes.  He understands and agrees with plans.  Surgery is scheduled urgently  Moderately complex medical decision making  [name], MD 04/07/2024, 8:46 AM

## 2024-04-07 NOTE — Anesthesia Procedure Notes (Signed)
 Procedure Name: Intubation Date/Time: 04/07/2024 2:42 PM  Performed by: Nanci Riis, CRNAPre-anesthesia Checklist: Patient identified, Emergency Drugs available, Suction available, Patient being monitored and Timeout performed Patient Re-evaluated:Patient Re-evaluated prior to induction Oxygen Delivery Method: Circle system utilized Preoxygenation: Pre-oxygenation with 100% oxygen Induction Type: IV induction Ventilation: Mask ventilation without difficulty Laryngoscope Size: Miller and 3 Grade View: Grade II Tube type: Oral Tube size: 7.5 mm Number of attempts: 1 Airway Equipment and Method: Stylet Placement Confirmation: ETT inserted through vocal cords under direct vision, positive ETCO2 and breath sounds checked- equal and bilateral Secured at: 22 cm Tube secured with: Tape Dental Injury: Teeth and Oropharynx as per pre-operative assessment

## 2024-04-07 NOTE — Anesthesia Postprocedure Evaluation (Signed)
 Anesthesia Post Note  Patient: Drew [name]  Procedure(s) Performed: IRRIGATION AND DEBRIDEMENT BUTTOCKS     Patient location during evaluation: PACU Anesthesia Type: General Level of consciousness: awake and alert Pain management: pain level controlled Vital Signs Assessment: post-procedure vital signs reviewed and stable Respiratory status: spontaneous breathing, nonlabored ventilation and respiratory function stable Cardiovascular status: blood pressure returned to baseline Postop Assessment: no apparent nausea or vomiting Anesthetic complications: no   No notable events documented.  Last Vitals:  Vitals:   04/07/24 1530 04/07/24 1545  BP: 126/70 127/70  Pulse: 100 97  Resp: 14 10  Temp:    SpO2: 98% 94%    Last Pain:  Vitals:   04/07/24 1548  TempSrc:   PainSc: Asleep                 Vertell Row

## 2024-04-07 NOTE — ED Notes (Signed)
 Writer pulled ceFEPIme  and primed line, but the tech taking pt to surgery advised to not connect it yet. Medication hung on back of bed.

## 2024-04-07 NOTE — ED Triage Notes (Addendum)
 Abscess on the buttocks started 2 weeks ago. Urinary retention started yesterday, has only urinated once yesterday morning, vomiting started last week and is persistent. Was given an antibiotic for it yesterday morning and it has been making him sick.

## 2024-04-07 NOTE — Progress Notes (Signed)
 Pharmacy Antibiotic Note  Drew [name] is a 47 y.o. male admitted on 04/07/2024 with a perianal abscess. I&D scheduled for today (7/9) at 1130. Pharmacy has been consulted for vancomycin  and cefepime  dosing.  Patient received one-time doses of cefepime  and vancomycin  this morning while in the ED.  Plan: - Start vancomycin  1000mg  IV q12h (eAUC 491 using Scr rounded to 0.8 and Vd 0.72) - Start cefepime  2g IV q8h - Monitor cultures, renal function, and overall clinical picture - De-escalate abx as able  Height: 5' 5 (165.1 cm) Weight: 65.8 kg (145 lb) IBW/kg (Calculated) : 61.5  Temp (24hrs), Avg:100 F (37.8 C), Min:98.6 F (37 C), Max:101.3 F (38.5 C)  Recent Labs  Lab 04/07/24 0435 04/07/24 0442 04/07/24 0638  WBC 22.9*  --   --   CREATININE 0.65  --   --   LATICACIDVEN  --  1.0 0.9    Estimated Creatinine Clearance: 98.2 mL/min (by C-G formula based on SCr of 0.65 mg/dL).    No Known Allergies  Antimicrobials this admission: 7/9 cefepime  >>  7/9 vancomycin  >>   Dose adjustments this admission: N/A  Microbiology results: 7/9 BCx: NG <12h   Thank you for allowing pharmacy to be a part of this patient's care.  Lacinda Moats, PharmD Clinical Pharmacist  7/9/20259:47 AM

## 2024-04-07 NOTE — Plan of Care (Signed)

## 2024-04-07 NOTE — ED Notes (Signed)
 Patient back from CT scan. Resting at this time.

## 2024-04-07 NOTE — ED Provider Notes (Signed)
 [place] EMERGENCY DEPARTMENT AT [facility] Provider Note   CSN: [id number] Arrival date & time: 04/07/24  0115     Patient presents with: Abscess   Drew [name] is a 48 y.o. male.  With past medical history of uncontrolled type 1 diabetes, hyperlipidemia, hypertension, coronary artery disease presents to emergency room with complaint of left gluteal abscess.  Patient reports this been present intermittently for several weeks.  Notes that this episode started worsening 2 weeks ago.  He has also noted fever intermittently nausea vomiting and had urinary retention today.  Denies any dysuria. Has been seen with Gen Surgery for this. Has been on antibiotics for pain and has not missed doses, but not working at all. He had I&D of a medial left gluteal abscess on 08/20/23 in the ED. He has taken 2 days of Augmentin .     Abscess      Prior to Admission medications   Medication Sig Start Date End Date Taking? Authorizing Provider  amLODipine  (NORVASC ) 5 MG tablet Take 1 tablet (5 mg total) by mouth daily. 01/19/24 04/19/24  [name], MD  amoxicillin -clavulanate (AUGMENTIN ) 875-125 MG tablet Take 1 tablet by mouth 2 (two) times daily for 10 days. 04/05/24 04/15/24  Desiderio Schanz, MD  atorvastatin  (LIPITOR) 20 MG tablet Take 1 tablet (20 mg total) by mouth daily. 01/19/24 04/19/24  [name], MD  Blood Glucose Monitoring Suppl (BLOOD GLUCOSE MONITOR SYSTEM) w/Device KIT Use 3 (three) times daily. 06/18/23   Perri DELENA Meliton Mickey., MD  carvedilol  (COREG ) 3.125 MG tablet Take 1 tablet (3.125 mg total) by mouth 2 (two) times daily with a meal. 01/27/24 04/26/24  [name], MD  cephALEXin  (KEFLEX ) 500 MG capsule Take 1 capsule (500 mg total) by mouth 2 (two) times daily. 03/01/24   Newlin, Enobong, MD  gabapentin  (NEURONTIN ) 300 MG capsule Take 1 capsule (300 mg total) by mouth at bedtime. 01/27/24   [name], MD  Glucose Blood (BLOOD GLUCOSE TEST STRIPS)  STRP Use 3 (three) times daily as directed to check blood sugar. 06/18/23   Perri DELENA Meliton Mickey., MD  ibuprofen  (ADVIL ) 600 MG tablet Take 1 tablet (600 mg total) by mouth every 8 (eight) hours as needed. 03/01/24   Newlin, Enobong, MD  Insulin  Glargine (BASAGLAR  KWIKPEN) 100 UNIT/ML Inject 20 Units into the skin 2 (two) times daily. 01/27/24   [name], MD  Insulin  Pen Needle (PEN NEEDLES) 32G X 4 MM MISC Use to inject Basaglar  once daily. 10/27/23   [name], MD  Insulin  Syringe-Needle U-100 (TRUEPLUS INSULIN  SYRINGE) 31G X 5/16 0.5 ML MISC Use to inject 70/30 insulin  twice daily. 09/19/23   [name], MD  lisinopril  (ZESTRIL ) 2.5 MG tablet Take 1 tablet (2.5 mg total) by mouth daily. 01/27/24   [name], MD  loperamide  (IMODIUM  A-D) 2 MG tablet Take 1 tablet (2 mg total) by mouth 3 (three) times daily as needed for diarrhea or loose stools. 11/25/23   [name], MD  metFORMIN  (GLUCOPHAGE -XR) 500 MG 24 hr tablet Take 1 tablet (500 mg total) by mouth daily with breakfast. 01/27/24   [name], MD  miconazole  (MICATIN) 2 % cream Apply 1 Application topically 2 (two) times daily. 12/05/23   [name], MD    Allergies: Patient has no known allergies.    Review of Systems  Skin:  Positive for wound.    Updated Vital Signs BP 130/69  Pulse 89   Temp (!) 101.3 F (38.5 C) (Oral)   Resp 17   Ht 5' 5 (1.651 m)   Wt 65.8 kg   SpO2 97%   BMI 24.13 kg/m   Physical Exam Vitals and nursing note reviewed.  Constitutional:      General: He is not in acute distress.    Appearance: He is not toxic-appearing.  HENT:     Head: Normocephalic and atraumatic.  Eyes:     General: No scleral icterus.    Conjunctiva/sclera: Conjunctivae normal.  Cardiovascular:     Rate and Rhythm: Normal rate and regular rhythm.     Pulses: Normal pulses.     Heart sounds: Normal heart sounds.  Pulmonary:     Effort: Pulmonary effort is normal. No  respiratory distress.     Breath sounds: Normal breath sounds.  Abdominal:     General: Abdomen is flat. Bowel sounds are normal.     Palpations: Abdomen is soft.     Tenderness: There is no abdominal tenderness.  Musculoskeletal:     Right lower leg: No edema.     Left lower leg: No edema.  Skin:    General: Skin is warm and dry.     Findings: No lesion.     Comments: Left gluteal and perineal abscess without base area of fluctuance.  Surrounding erythema.  Neurological:     General: No focal deficit present.     Mental Status: He is alert and oriented to person, place, and time. Mental status is at baseline.     (all labs ordered are listed, but only abnormal results are displayed) Labs Reviewed  COMPREHENSIVE METABOLIC PANEL WITH GFR - Abnormal; Notable for the following components:      Result Value   Sodium 132 (*)    Potassium 2.9 (*)    Glucose, Bld 201 (*)    BUN 23 (*)    Calcium  8.5 (*)    Albumin 2.6 (*)    Alkaline Phosphatase 168 (*)    Total Bilirubin 1.4 (*)    All other components within normal limits  CBC WITH DIFFERENTIAL/PLATELET - Abnormal; Notable for the following components:   WBC 22.9 (*)    RBC 4.21 (*)    Hemoglobin 12.1 (*)    HCT 34.6 (*)    Neutro Abs 18.4 (*)    Monocytes Absolute 1.7 (*)    Abs Immature Granulocytes 0.38 (*)    All other components within normal limits  URINALYSIS, W/ REFLEX TO CULTURE (INFECTION SUSPECTED) - Abnormal; Notable for the following components:   APPearance HAZY (*)    Glucose, UA >=500 (*)    Hgb urine dipstick SMALL (*)    Ketones, ur 5 (*)    Protein, ur >=300 (*)    All other components within normal limits  RESP PANEL BY RT-PCR (RSV, FLU A&B, COVID)  RVPGX2  CULTURE, BLOOD (ROUTINE X 2)  CULTURE, BLOOD (ROUTINE X 2)  PROTIME-INR  MAGNESIUM  I-STAT CG4 LACTIC ACID, ED  I-STAT CG4 LACTIC ACID, ED    EKG: None  Radiology: DG Chest Port 1 View Result Date: 04/07/2024 EXAM: 1 VIEW XRAY OF THE  CHEST 04/07/2024 04:45:00 AM COMPARISON: 2 view chest x-ray 06/16/2023. CLINICAL HISTORY: Questionable sepsis - evaluate for abnormality. ? Sepsis, abscess to buttocks, fever, vomiting, urinary retention. FINDINGS: LUNGS AND PLEURA: Lung volumes are low. No focal pulmonary opacity. No pulmonary edema. No pleural effusion. No pneumothorax. HEART AND MEDIASTINUM: No acute abnormality  of the cardiac and mediastinal silhouettes. BONES AND SOFT TISSUES: No acute osseous abnormality. IMPRESSION: 1. No acute process. 2. Low lung volumes. Electronically signed by: Lonni Necessary MD 04/07/2024 05:01 AM EDT RP Workstation: [id number]     Procedures   Medications Ordered in the ED  potassium chloride  SA (KLOR-CON  M) CR tablet 40 mEq (has no administration in time range)  sodium chloride  0.9 % bolus 1,000 mL (0 mLs Intravenous Stopped 04/07/24 0545)  vancomycin  (VANCOCIN ) IVPB 1000 mg/200 mL premix (0 mg Intravenous Stopped 04/07/24 0543)  ceFEPIme  (MAXIPIME ) 2 g in sodium chloride  0.9 % 100 mL IVPB (0 g Intravenous Stopped 04/07/24 0516)  HYDROmorphone  (DILAUDID ) injection 0.5 mg (0.5 mg Intravenous Given 04/07/24 0443)  acetaminophen  (TYLENOL ) tablet 1,000 mg (1,000 mg Oral Given 04/07/24 0443)                                    Medical Decision Making Amount and/or Complexity of Data Reviewed Labs: ordered. Radiology: ordered.  Risk OTC drugs. Prescription drug management.   This patient presents to the ED for concern of abscess, this involves an extensive number of treatment options, and is a complaint that carries with it a high risk of complications and morbidity.  The differential diagnosis includes perirectal abscess, cellulitis, foreign years, sepsis   Co morbidities that complicate the patient evaluation  uncontrolled type 1 diabetes, hyperlipidemia, hypertension, coronary artery disease   Additional history obtained:  Additional history obtained from OV with gen surgery yesterday     Lab Tests:  I personally interpreted labs.  The pertinent results include:   CBC with leukocytosis of 22.  CMP with potassium of 2.9 will supplement orally.  Elevated glucose but no elevated anion gap.  Respiratory panel negative lactic negative.  Blood cultures are pending   Imaging Studies ordered:  I ordered imaging studies including chest x-ray negative.  CT scan pending at time of sign out to eval for extent of abscess.    Cardiac Monitoring: / EKG:  The patient was maintained on a cardiac monitor.     Problem List / ED Course / Critical interventions / Medication management  Patient reports to emergency room with 2 weeks of gluteal abscess and cellulitis.  Has had this issue in the past.  Is currently on antibiotics.  Has had associated fever.  Has elevated white blood cell count here.  Given vital signs and fever obtained blood cultures and started patient on normal saline bolus and antibiotics.  He has been hemodynamically stable here.  He is feeling better after pain control.  Will obtain CT scan of abdomen and pelvis to further assess abscess.  Anticipate need for general surgery consult and admission for sepsis. When attempting to get urine, patient unable to void, notes suprapubic fullness, has not happened before. Bladder scan shows >500. Foley in place. No UTI.  I ordered medication including Vanco, cefepime , normal saline.  Potassium Reevaluation of the patient after these medicines showed that the patient improved I have reviewed the patients home medicines and have made adjustments as needed Reach out to general surgery after scan is back. Admit for sepsis.        Final diagnoses:  Gluteal abscess  Sepsis, due to unspecified organism, unspecified whether acute organ dysfunction present [facility])  Urinary retention    ED Discharge Orders     None          Monia Timmers,  Warren SAILOR, PA-C 04/07/24 [id number]    Griselda Norris, MD 04/08/24 [phone]

## 2024-04-07 NOTE — Consult Note (Signed)
 Initial Consultation Note   Patient: Drew [name] FMW:[id number] DOB: Jun 08, 1976 PCP: [name], MD DOA: 04/07/2024 DOS: the patient was seen and examined on 04/07/2024 Primary service: Dreama Longs, MD  Referring physician: [name], MD. Reason for consult: Perianal abscess. Assessment and Plan: Principal Problem:   Abscess, gluteal, left Admit to MedSurg/inpatient. Continue IV fluids. Continue cefepime  2 g every 8 hours.   Continue vancomycin  per pharmacy. Analgesics as needed. Follow-up blood culture and sensitivity Follow wound culture and sensitivity. Follow CBC and CMP in a.m.  Active Problems:   Nausea with vomiting Received 2 L of LR in the ED earlier. Antiemetics as needed.    Essential hypertension Resume carvedilol  3.125 mg p.o. twice daily. Resume lisinopril  2.5 mg p.o. daily.    Pure hypercholesterolemia Resume atorvastatin  20 mg p.o. daily.    CAD (coronary artery disease) Resume beta-blocker. Resume statin. Resume aspirin .   TRH will continue to follow the patient.  HPI: Drew [name] is a 48 y.o. male with past medical history of CAD, diabetic retinopathy, hyperlipidemia, hypertension, type 2 diabetes, history of DKA, left gluteal abscess treated with I&D on 08/20/2023 who developed an abscess 2 weeks ago and presented to the emergency department with complaints of intermittent fever, pain, nausea associated with emesis and urinary retention.  He has also had diarrhea.  He was seen twice since last month, given cephalexin  and most recently has been taking Augmentin .  He has not been compliant with his treatment regimen.  Hemoglobin A1c was greater than 15 2-1/2 months ago. He denied fever, chills, rhinorrhea, sore throat, wheezing or hemoptysis.  No chest pain, palpitations, diaphoresis, PND, orthopnea or pitting edema of the lower extremities.  No abdominal pain, constipation, melena or hematochezia.  No flank pain, dysuria,  frequency or hematuria.  No polyuria, polydipsia, polyphagia or blurred vision.   Lab work: Urinalysis was hazy with glucose greater than 500, ketones of 5 and proteinuria greater than 300 mg/dL.  Coronavirus, influenza and RSV PCR was negative.  Lactic acid was normal twice.  CBC showed white count 22.9, hemoglobin 12.1 g/dL platelets are 748.  Normal PT and INR.  CMP showed a potassium of 2.9 mmol/L, the rest of the electrolytes were normal after sodium and calcium  correction.  Glucose 201, BUN 23, creatinine 0.65 and total bilirubin 1.4 mg/dL.  Total protein 7.2 and albumin 2.6 g/dL alkaline phosphatase is 168 units/L and transaminases were normal.  Imaging: Portable 1 view chest radiograph with no acute process.  CT abdomen/pelvis with contrast showing diffuse left perirectal inflammatory changes along the left gluteal fold measuring 7.9 x 6.1 x 7.5 cm without discrete fluid collection or fistula tracts.  This likely reflects residual or recurrent infection.  ED course: Initial vital signs were temperature 100 F, pulse 93, respiration 19, BP 116/65 mmHg O2 sat 100% on room air.  Review of Systems: As mentioned in the history of present illness. All other systems reviewed and are negative.  Past Medical History:  Diagnosis Date   Abscess, gluteal, left    Coronary artery disease    Diabetic retinopathy (HCC)    Hyperlipidemia associated with type 2 diabetes mellitus (HCC)    Hypertension    Type 2 diabetes mellitus (HCC)    History reviewed. No pertinent surgical history. Social History:  reports that he has never smoked. He has never used smokeless tobacco. He reports that he does not drink alcohol and does not use drugs.  No Known Allergies  History reviewed. No pertinent family  history.  Prior to Admission medications   Medication Sig Start Date End Date Taking? Authorizing Provider  amLODipine  (NORVASC ) 5 MG tablet Take 1 tablet (5 mg total) by mouth daily. 01/19/24 04/19/24   [name], MD  amoxicillin -clavulanate (AUGMENTIN ) 875-125 MG tablet Take 1 tablet by mouth 2 (two) times daily for 10 days. 04/05/24 04/15/24  Desiderio Schanz, MD  atorvastatin  (LIPITOR) 20 MG tablet Take 1 tablet (20 mg total) by mouth daily. 01/19/24 04/19/24  [name], MD  Blood Glucose Monitoring Suppl (BLOOD GLUCOSE MONITOR SYSTEM) w/Device KIT Use 3 (three) times daily. 06/18/23   Perri DELENA Meliton Mickey., MD  carvedilol  (COREG ) 3.125 MG tablet Take 1 tablet (3.125 mg total) by mouth 2 (two) times daily with a meal. 01/27/24 04/26/24  [name], MD  cephALEXin  (KEFLEX ) 500 MG capsule Take 1 capsule (500 mg total) by mouth 2 (two) times daily. 03/01/24   Newlin, Enobong, MD  gabapentin  (NEURONTIN ) 300 MG capsule Take 1 capsule (300 mg total) by mouth at bedtime. 01/27/24   [name], MD  Glucose Blood (BLOOD GLUCOSE TEST STRIPS) STRP Use 3 (three) times daily as directed to check blood sugar. 06/18/23   Perri DELENA Meliton Mickey., MD  ibuprofen  (ADVIL ) 600 MG tablet Take 1 tablet (600 mg total) by mouth every 8 (eight) hours as needed. 03/01/24   Newlin, Enobong, MD  Insulin  Glargine (BASAGLAR  KWIKPEN) 100 UNIT/ML Inject 20 Units into the skin 2 (two) times daily. 01/27/24   [name], MD  Insulin  Pen Needle (PEN NEEDLES) 32G X 4 MM MISC Use to inject Basaglar  once daily. 10/27/23   [name], MD  Insulin  Syringe-Needle U-100 (TRUEPLUS INSULIN  SYRINGE) 31G X 5/16 0.5 ML MISC Use to inject 70/30 insulin  twice daily. 09/19/23   [name], MD  lisinopril  (ZESTRIL ) 2.5 MG tablet Take 1 tablet (2.5 mg total) by mouth daily. 01/27/24   [name], MD  loperamide  (IMODIUM  A-D) 2 MG tablet Take 1 tablet (2 mg total) by mouth 3 (three) times daily as needed for diarrhea or loose stools. 11/25/23   [name], MD  metFORMIN  (GLUCOPHAGE -XR) 500 MG 24 hr tablet Take 1 tablet (500 mg total) by mouth daily with breakfast. 01/27/24   [name], MD   miconazole  (MICATIN) 2 % cream Apply 1 Application topically 2 (two) times daily. 12/05/23   [name], MD    Physical Exam: Vitals:   04/07/24 0600 04/07/24 0700 04/07/24 0744 04/07/24 0800  BP: (!) 110/58 127/78  131/73  Pulse: 86 83  83  Resp: 16 15  16   Temp:   98.6 F (37 C)   TempSrc:   Oral   SpO2: 97% 98%  98%  Weight:      Height:       Physical Exam Vitals and nursing note reviewed.  Constitutional:      General: He is awake. He is not in acute distress.    Appearance: Normal appearance. He is ill-appearing.  HENT:     Head: Normocephalic.     Nose: No rhinorrhea.     Mouth/Throat:     Mouth: Mucous membranes are dry.  Eyes:     General: No scleral icterus.    Pupils: Pupils are equal, round, and reactive to light.  Neck:     Vascular: No JVD.  Cardiovascular:     Rate and Rhythm: Normal rate and regular rhythm.     Heart sounds: S1  normal and S2 normal.  Pulmonary:     Effort: Pulmonary effort is normal.     Breath sounds: Normal breath sounds. No wheezing, rhonchi or rales.  Abdominal:     General: Bowel sounds are normal. There is no distension.     Palpations: Abdomen is soft.     Tenderness: There is no abdominal tenderness. There is no right CVA tenderness or left CVA tenderness.  Musculoskeletal:     Cervical back: Neck supple.     Right lower leg: No edema.     Left lower leg: No edema.  Skin:    General: Skin is warm and dry.     Findings: Lesion present.  Neurological:     General: No focal deficit present.     Mental Status: He is alert and oriented to person, place, and time.  Psychiatric:        Mood and Affect: Mood normal.        Behavior: Behavior normal. Behavior is cooperative.     Data Reviewed:   Results are pending, will review when available.   Family Communication:  Primary team communication:  Thank you very much for involving us  in the care of your patient.  Author: Alm Dorn Castor, MD 04/07/2024 9:00  AM  For on call review [email or web address].   This document was prepared using Dragon voice recognition software and may contain some unintended transcription errors.

## 2024-04-07 NOTE — ED Notes (Signed)
 Pt wiped down fully with chlorhexidine wipes and pt brushed his teeth. Pt dressed into gown for surgery.

## 2024-04-07 NOTE — Anesthesia Preprocedure Evaluation (Addendum)
 Anesthesia Evaluation  Patient identified by MRN, date of birth, ID band Patient awake    Reviewed: Allergy & Precautions, NPO status , Patient's Chart, lab work & pertinent test results  History of Anesthesia Complications Negative for: history of anesthetic complications  Airway Mallampati: II  TM Distance: >3 FB Neck ROM: Full    Dental  (+) Missing,    Pulmonary neg pulmonary ROS   Pulmonary exam normal        Cardiovascular hypertension, Pt. on medications + CAD and + Past MI  Normal cardiovascular exam     Neuro/Psych  Headaches    GI/Hepatic Neg liver ROS,,,Perianal abscess   Endo/Other  diabetes, Type 2, Insulin  Dependent, Oral Hypoglycemic Agents    Renal/GU negative Renal ROS     Musculoskeletal negative musculoskeletal ROS (+)    Abdominal   Peds  Hematology negative hematology ROS (+)   Anesthesia Other Findings Day of surgery medications reviewed with patient.  Reproductive/Obstetrics                              Anesthesia Physical Anesthesia Plan  ASA: 3  Anesthesia Plan: General   Post-op Pain Management: Tylenol  PO (pre-op)*   Induction: Intravenous  PONV Risk Score and Plan: 3 and Treatment may vary due to age or medical condition, Ondansetron , Dexamethasone  and Midazolam   Airway Management Planned: Oral ETT  Additional Equipment: None  Intra-op Plan:   Post-operative Plan: Extubation in OR  Informed Consent: I have reviewed the patients History and Physical, chart, labs and discussed the procedure including the risks, benefits and alternatives for the proposed anesthesia with the patient or authorized representative who has indicated his/her understanding and acceptance.     Dental advisory given and Interpreter used for interview  Plan Discussed with: CRNA  Anesthesia Plan Comments:          Anesthesia Quick Evaluation

## 2024-04-07 NOTE — Transfer of Care (Signed)
 Immediate Anesthesia Transfer of Care Note  Patient: Drew [name]  Procedure(s) Performed: IRRIGATION AND DEBRIDEMENT BUTTOCKS  Patient Location: PACU  Anesthesia Type:General  Level of Consciousness: drowsy and patient cooperative  Airway & Oxygen Therapy: Patient Spontanous Breathing  Post-op Assessment: Report given to RN and Post -op Vital signs reviewed and stable  Post vital signs: Reviewed and stable  Last Vitals:  Vitals Value Taken Time  BP 120/63 04/07/24 15:20  Temp    Pulse 99 04/07/24 15:22  Resp 11 04/07/24 15:22  SpO2 96 % 04/07/24 15:22  Vitals shown include unfiled device data.  Last Pain:  Vitals:   04/07/24 1227  TempSrc: Oral  PainSc:          Complications: No notable events documented.

## 2024-04-08 ENCOUNTER — Encounter (HOSPITAL_COMMUNITY): Payer: Self-pay | Admitting: Surgery

## 2024-04-08 LAB — COMPREHENSIVE METABOLIC PANEL WITH GFR
ALT: 11 U/L (ref 0–44)
AST: 11 U/L — ABNORMAL LOW (ref 15–41)
Albumin: 2.1 g/dL — ABNORMAL LOW (ref 3.5–5.0)
Alkaline Phosphatase: 164 U/L — ABNORMAL HIGH (ref 38–126)
Anion gap: 12 (ref 5–15)
BUN: 22 mg/dL — ABNORMAL HIGH (ref 6–20)
CO2: 20 mmol/L — ABNORMAL LOW (ref 22–32)
Calcium: 7.7 mg/dL — ABNORMAL LOW (ref 8.9–10.3)
Chloride: 96 mmol/L — ABNORMAL LOW (ref 98–111)
Creatinine, Ser: 0.77 mg/dL (ref 0.61–1.24)
GFR, Estimated: >60 mL/min (ref >60)
Glucose, Bld: 425 mg/dL — ABNORMAL HIGH (ref 70–99)
Potassium: 3.7 mmol/L (ref 3.5–5.1)
Sodium: 128 mmol/L — ABNORMAL LOW (ref 135–145)
Total Bilirubin: 1.4 mg/dL — ABNORMAL HIGH (ref 0.0–1.2)
Total Protein: 5.9 g/dL — ABNORMAL LOW (ref 6.5–8.1)

## 2024-04-08 LAB — CBC
HCT: 33.1 % — ABNORMAL LOW (ref 39.0–52.0)
Hemoglobin: 11.4 g/dL — ABNORMAL LOW (ref 13.0–17.0)
MCH: 29.1 pg (ref 26.0–34.0)
MCHC: 34.4 g/dL (ref 30.0–36.0)
MCV: 84.4 fL (ref 80.0–100.0)
Platelets: 249 K/uL (ref 150–400)
RBC: 3.92 MIL/uL — ABNORMAL LOW (ref 4.22–5.81)
RDW: 11.9 % (ref 11.5–15.5)
WBC: 23.9 K/uL — ABNORMAL HIGH (ref 4.0–10.5)
nRBC: 0 % (ref 0.0–0.2)

## 2024-04-08 LAB — GLUCOSE, CAPILLARY
Glucose-Capillary: 426 mg/dL — ABNORMAL HIGH (ref 70–99)
Glucose-Capillary: 393 mg/dL — ABNORMAL HIGH (ref 70–99)
Glucose-Capillary: 369 mg/dL — ABNORMAL HIGH (ref 70–99)
Glucose-Capillary: 344 mg/dL — ABNORMAL HIGH (ref 70–99)

## 2024-04-08 LAB — MAGNESIUM: Magnesium: 2.1 mg/dL (ref 1.7–2.4)

## 2024-04-08 LAB — PHOSPHORUS: Phosphorus: 4.7 mg/dL — ABNORMAL HIGH (ref 2.5–4.6)

## 2024-04-08 NOTE — TOC Initial Note (Signed)
 Transition of Care [facility]) - Initial/Assessment Note    Patient Details  Name: Drew [name] MRN: [id number] Date of Birth: 10-27-1975  Transition of Care [facility]) CM/SW Contact:    Alfonse JONELLE Rex, RN Phone Number: 04/08/2024, 2:27 PM  Clinical Narrative:  Met with patient using Spanish video intepreter  to introduce role of TOC/NCM and review for dc planning. Patient confirmed he has a PCP, states PCP provides taxi transportation to his appointments. Patient reports he resides in an apartment alone, reports no home care services, states he has learned to furniture walk and is able to move around in his apartment and manage the steps to his apartment. Patient reports he is supported by [facility] in [place], they assist in preparing meals for him, assist with ADL's,  chores in his home, and assist with medications. Patient inquiring regarding referral to eye specialist to evaluate treatment for his blindness. Patient reports he lives alone and has no relatives in [place] , states the has had feelings of depression and suicidal thoughts in the past, denies feels of depression or suicidal thoughts now because he is the hospital and everyone is taking good care of him.  Teams chat sent to team informing patient requesting referral to eye specialist and regarding his feelings of depression and suicidal thoughts. TOC will continue to follow, assist with referrals as need, MATCH for medications and transportation at discharge.                 Expected Discharge Plan: Home/Self Care Barriers to Discharge: Continued Medical Work up   Patient Goals and CMS Choice Patient states their goals for this hospitalization and ongoing recovery are:: return home          Expected Discharge Plan and Services       Living arrangements for the past 2 months: Apartment                                      Prior Living Arrangements/Services Living arrangements for the past 2 months:  Apartment Lives with:: Self Patient language and need for interpreter reviewed:: Yes Do you feel safe going back to the place where you live?: Yes      Need for Family Participation in Patient Care: Yes (Comment) Care giver support system in place?: Yes (comment)   Criminal Activity/Legal Involvement Pertinent to Current Situation/Hospitalization: No - Comment as needed  Activities of Daily Living   ADL Screening (condition at time of admission) Independently performs ADLs?: Yes (appropriate for developmental age) Is the patient deaf or have difficulty hearing?: No Does the patient have difficulty seeing, even when wearing glasses/contacts?: Yes Does the patient have difficulty concentrating, remembering, or making decisions?: No  Permission Sought/Granted                  Emotional Assessment Appearance:: Appears stated age Attitude/Demeanor/Rapport: Engaged, Gracious Affect (typically observed): Accepting Orientation: : Oriented to Self, Oriented to Place, Oriented to  Time, Oriented to Situation Alcohol / Substance Use: Not Applicable Psych Involvement: No (comment)  Admission diagnosis:  Urinary retention [R33.9] Gluteal abscess [L02.31] Abscess, gluteal, left [L02.31] Sepsis, due to unspecified organism, unspecified whether acute organ dysfunction present [facility]) [A41.9] Patient Active Problem List   Diagnosis Date Noted   Abscess, gluteal, left 04/07/2024   Gluteal abscess 04/07/2024   CAD (coronary artery disease) 04/07/2024   Coronary artery calcification seen on CT scan 03/22/2024  Pure hypercholesterolemia 03/22/2024   Essential hypertension 07/14/2023   Hyperlipidemia due to type 1 diabetes mellitus (HCC) 07/14/2023   NSTEMI (non-ST elevated myocardial infarction) (HCC) 06/17/2023   Hypertensive urgency 06/17/2023   Hypertensive emergency 06/17/2023   Hyperglycemic crisis due to diabetes mellitus (HCC) 06/17/2023   Headache 06/17/2023   Diabetic  retinopathy (HCC) 06/17/2023   Blindness of right eye 06/17/2023   Insulin  dependent type 1 diabetes mellitus (HCC) 06/17/2023   AKI (acute kidney injury) (HCC) 06/17/2023   DKA (diabetic ketoacidosis) (HCC) 02/14/2014   Nausea with vomiting 02/14/2014   PCP:  [name], MD Pharmacy:   [facility] [address], [address] [place] [address] Phone: [phone] Fax: [phone]     Social Drivers of Health (SDOH) Social History: SDOH Screenings   Food Insecurity: Food Insecurity Present (04/07/2024)  Housing: High Risk (04/07/2024)  Transportation Needs: Unmet Transportation Needs (04/07/2024)  Utilities: Not At Risk (04/07/2024)  Depression (PHQ2-9): Low Risk  (09/19/2023)  Financial Resource Strain: High Risk (01/28/2024)  Tobacco Use: Low Risk  (04/07/2024)  Health Literacy: Inadequate Health Literacy (03/24/2024)   SDOH Interventions:     Readmission Risk Interventions    04/08/2024    2:26 PM  Readmission Risk Prevention Plan  Transportation Screening Complete  PCP or Specialist Appt within 5-7 Days Complete  Home Care Screening Complete  Medication Review (RN CM) Complete

## 2024-04-08 NOTE — Plan of Care (Signed)
  Problem: Activity: Goal: Risk for activity intolerance will decrease Outcome: Progressing   Problem: Pain Managment: Goal: General experience of comfort will improve and/or be controlled Outcome: Progressing   Problem: Safety: Goal: Ability to remain free from injury will improve Outcome: Progressing   Problem: Fluid Volume: Goal: Ability to maintain a balanced intake and output will improve Outcome: Progressing   Problem: Nutritional: Goal: Maintenance of adequate nutrition will improve Outcome: Progressing

## 2024-04-08 NOTE — Hospital Course (Addendum)
 Patient is a 48 y.o. male with past medical history of CAD, diabetic retinopathy with blindness, hyperlipidemia, hypertension, type 2 diabetes, history of DKA, left gluteal abscess treated with I&D on 08/20/2023 presented to hospital with intermittent fever nausea vomiting and urinary retention and diarrhea.  He was seen twice since last month, given cephalexin  and most recently has been taking Augmentin .  He has not been compliant with his treatment regimen.  Hemoglobin A1c was greater than 15 around 2 and half months ago.  On this admission he was admitted to the hospital for left gluteal/perianal abscess, s/p incision and drainage on 7/9, currently on IV antibiotics cefepime  and vancomycin .  Assessment and Plan:   Left gluteal/perianal abscess Seen by general surgery and underwent incision and drainage on 7/9.  Continue IV cefepime .  Vancomycin  has been discontinued.  Blood cultures negative in 2 days.  Wound culture with gram-negative rods.  Continue to follow sensitivity.- Continue pain medications.  Mild leukocytosis present.   Nausea and vomiting: Resolved As needed antiemetics.   Essential HTN On Coreg  and lisinopril .  Appears stable   Hyperlipidemia - Continue atorvastatin    Coronary artery disease. No active issues.  Continue e aspirin  and statin and beta-blocker   Diabetic retinopathy/blindness with hyperglycemia. Follow-up with ophthalmology as outpatient.  Continue sliding scale insulin , Accu-Cheks, diabetic diet.  Will get hemoglobin A1c.  Previous hemoglobin A1c more than 15, two and half of months ago.  Will consult diabetic coordinator.  Blood cultures negative in 2 days.   History of depression/suicidal ideation Currently stable.   Mild hypokalemia.   Has been replenished.  Latest potassium 3.5.

## 2024-04-08 NOTE — Plan of Care (Signed)

## 2024-04-08 NOTE — Inpatient Diabetes Management (Signed)
 Inpatient Diabetes Program Recommendations  AACE/ADA: New Consensus Statement on Inpatient Glycemic Control (2015)  Target Ranges:  Prepandial:   less than 140 mg/dL      Peak postprandial:   less than 180 mg/dL (1-2 hours)      Critically ill patients:  140 - 180 mg/dL   Lab Results  Component Value Date   GLUCAP 369 (H) 04/08/2024   HGBA1C >15.0 01/27/2024    Review of Glycemic Control  Diabetes history: DM1? Outpatient Diabetes medications: Basaglar  20 units BID (pt states he's taking 40 BID), metformin  500 with breakfast (not taking) Current orders for Inpatient glycemic control: Semglee  15 BID, Novolog  0-15 TID  HgbA1C - > 15% BGs 426, 393,369    Inpatient Diabetes Program Recommendations:    Used interpreter services for bedside visit.  Pt states he is blind d/t retinopathy and lives alone. Does not check blood sugars. States he takes Basaglar  40 units BID (orders are for 20 units BID) States he counts the clicks to know how much insulin  he's getting. Has PCP - [facility], and sees pharmacist for meds every 3 months. Has transportation to doctor appts. Needs talking glucose meter for the blind. Very poor support system.  Pt says he eats variety of foods, drinks a lot of water, likes juices. Discussed importance of drinking beverages without sugar, such as diet soda and Gatorade 0. Also discussed blood glucose monitoring and getting assistance from family or friends.  Very tough case.  Needs social support for assistance with controlling his blood sugars. Language barrier, blindness, poor socioeconomic status make this a difficult situation. Pt would like eye specialist consult. Discussed importance of getting better control to reduce risks of complications and poor healing. TOC is meeting with pt during this hospitalization. Needs tight glycemic control for healing.  70/30 insulin  may be a better option since pt is not on a rapid-acting insulin  for meal coverage.  Will need further  discussion with pt using interpreter. NEEDS BLOOD GLUCOSE METER FOR THE BLIND - Spanish-speaking meter  Continue to follow.  Thank you. Shona Brandy, RD, LDN, CDCES Inpatient Diabetes Coordinator [phone]

## 2024-04-08 NOTE — Progress Notes (Signed)
  Progress Note   Patient: Drew [name] FMW:[id number] DOB: [birth year] DOA: 04/07/2024     1 DOS: the patient was seen and examined on 04/08/2024   Brief hospital course: 48 y.o. male with past medical history of CAD, diabetic retinopathy with blindness, hyperlipidemia, hypertension, type 2 diabetes, history of DKA, left gluteal abscess treated with I&D on 08/20/2023 who developed an abscess 2 weeks ago and presented to the emergency department with complaints of intermittent fever, pain, nausea associated with emesis and urinary retention.  He has also had diarrhea.  He was seen twice since last month, given cephalexin  and most recently has been taking Augmentin .  He has not been compliant with his treatment regimen.  Hemoglobin A1c was greater than 15 2-1/2 months ago. He was admitted to the hospital for left gluteal/perianal abscess, s/p incision and drainage, 7/9, on IV antibiotics cefepime  and vancomycin .  Assessment and Plan:    left gluteal/perianal abscess - S/p incision and drainage 7/9 -Continue IV cefepime  and vancomycin  - Continue to follow the cultures - Continue pain medications  2.  Nausea and vomiting: Resolved - Continue to follow-up with antiemetics if needed  3.  HTN - Stable - Continue Coreg  and lisinopril   4.  HLD - Continue atorvastatin   5.  CAD - Stable Continue aspirin  and statin and beta-blocker  6.  Diabetic retinopathy/blindness - It appears to be chronic - Will discuss with the patient for outpatient follow-up with ophthalmology     Subjective: Complaints of pain over the left gluteal area  Physical Exam: Vitals:   04/07/24 2116 04/08/24 0208 04/08/24 0620 04/08/24 0918  BP: (!) 145/84 124/75 118/68 124/75  Pulse: 95 85 89 89  Resp: 16 18 15 16   Temp: 97.7 F (36.5 C) 97.8 F (36.6 C) 98 F (36.7 C)   TempSrc:      SpO2: 95% 97% 98% 100%  Weight:      Height:       Constitutional: Alert, awake, calm, comfortable HEENT: Neck  supple Respiratory: clear to auscultation bilaterally, no wheezing, no crackles. Normal respiratory effort. No accessory muscle use.  Cardiovascular: Regular rate and rhythm, no murmurs / rubs / gallops. No extremity edema. 2+ pedal pulses. No carotid bruits.  Abdomen: no tenderness, no masses palpated. No hepatosplenomegaly. Bowel sounds positive.  Musculoskeletal: Left gluteal abscess, s/p incision and drainage, gauze present Skin: no rashes, lesions, ulcers. No induration Neurologic: CN 2-12 grossly intact. Sensation intact, DTR normal. Strength 5/5 x all 4 extremities.  Psychiatric: Normal judgment and insight. Alert and oriented x 3. Normal mood.   Data Reviewed:  White count 23.9, sodium 128, BUN 22, creatinine 0.77  Family Communication: No family was present  Disposition: Status is: Inpatient Remains inpatient appropriate because: Ongoing treatment of gluteal/perineal abscess  Planned Discharge Destination: Home    Time spent: 35  minutes  Author: Nena Rebel, MD 04/08/2024 3:59 PM  For on call review [email or web address].

## 2024-04-08 NOTE — Progress Notes (Signed)
 1 Day Post-Op  Subjective: CC: Patient doing well overall. Pain is well managed. Tolerating food with no complaints of N/V. Patient is blind and is Spanish speaking only. I used the Ipad device for interpretation/translation.   Objective: Vital signs in last 24 hours: Temp:  [97.7 F (36.5 C)-99.1 F (37.3 C)] 98 F (36.7 C) (07/10 0620) Pulse Rate:  [85-106] 89 (07/10 0918) Resp:  [9-18] 16 (07/10 0918) BP: (118-179)/(63-94) 124/75 (07/10 0918) SpO2:  [93 %-100 %] 100 % (07/10 0918) Weight:  [65.8 kg] 65.8 kg (07/09 1239) Last BM Date : 04/07/24  Intake/Output from previous day: 07/09 0701 - 07/10 0700 In: 1908.9 [P.O.:840; I.V.:700; IV Piggyback:368.9] Out: 1310 [Urine:1300; Blood:10] Intake/Output this shift: Total I/O In: -  Out: 800 [Urine:800]  PE: Constitutional:      General: He is not in acute distress.    Appearance: He is not ill-appearing.  Eyes:     Comments: Patient is blind   Cardiovascular:     Rate and Rhythm: Normal rate.  Pulmonary:     Effort: Pulmonary effort is normal.  Genitourinary:    Comments: Abscess drained in OR. Wound packed with iodoform. Incision cite C/D/I without signs of infx. Old blood noted but no active bleeding.   Skin:    General: Skin is warm and dry.  Neurological:     General: No focal deficit present.     Mental Status: He is alert.  Psychiatric:        Mood and Affect: Mood normal.    Lab Results:  Recent Labs    04/07/24 0435 04/08/24 0329  WBC 22.9* 23.9*  HGB 12.1* 11.4*  HCT 34.6* 33.1*  PLT 251 249   BMET Recent Labs    04/07/24 0435 04/08/24 0329  NA 132* 128*  K 2.9* 3.7  CL 98 96*  CO2 24 20*  GLUCOSE 201* 425*  BUN 23* 22*  CREATININE 0.65 0.77  CALCIUM  8.5* 7.7*   PT/INR Recent Labs    04/07/24 0435  LABPROT 14.2  INR 1.0   CMP     Component Value Date/Time   NA 128 (L) 04/08/2024 0329   NA 139 07/14/2023 1609   K 3.7 04/08/2024 0329   CL 96 (L) 04/08/2024 0329   CO2 20  (L) 04/08/2024 0329   GLUCOSE 425 (H) 04/08/2024 0329   BUN 22 (H) 04/08/2024 0329   BUN 20 07/14/2023 1609   CREATININE 0.77 04/08/2024 0329   CALCIUM  7.7 (L) 04/08/2024 0329   PROT 5.9 (L) 04/08/2024 0329   ALBUMIN 2.1 (L) 04/08/2024 0329   AST 11 (L) 04/08/2024 0329   ALT 11 04/08/2024 0329   ALKPHOS 164 (H) 04/08/2024 0329   BILITOT 1.4 (H) 04/08/2024 0329   GFRNONAA >60 04/08/2024 0329   GFRAA >60 07/31/2018 1904   Lipase     Component Value Date/Time   LIPASE 45 08/20/2023 2103    Studies/Results: CT ABDOMEN PELVIS W CONTRAST Result Date: 04/07/2024 EXAM: CT ABDOMEN AND PELVIS WITH CONTRAST 04/07/2024 06:34:07 AM TECHNIQUE: CT of the abdomen and pelvis was performed with the administration of intravenous contrast. Multiplanar reformatted images are provided for review. Automated exposure control, iterative reconstruction, and/or weight based adjustment of the mA/kV was utilized to reduce the radiation dose to as low as reasonably achievable. COMPARISON: CT of the abdomen and pelvis with contrast 08/21/2023. CLINICAL HISTORY: Abdominal pain, acute, nonlocalized; Perineal abscess. Abscess on the buttocks started 2 weeks ago. Urinary retention started yesterday, has  only urinated once yesterday morning, vomiting started last week and is persistent. Was given an antibiotic for it yesterday morning and it has been making him sick, WBC's 22.9, GFR>60. FINDINGS: LOWER CHEST: Mild dependent atelectasis is present at the lung bases bilaterally. LIVER: Diffuse fatty infiltration of the liver is present. No focal lesions are present. GALLBLADDER AND BILE DUCTS: Gallbladder is unremarkable. No biliary ductal dilatation. SPLEEN: No acute abnormality. PANCREAS: No acute abnormality. ADRENAL GLANDS: No acute abnormality. KIDNEYS, URETERS AND BLADDER: No stones in the kidneys or ureters. No hydronephrosis. No perinephric or periureteral stranding. A Foley catheter is present within the urinary bladder.  GI AND BOWEL: Stomach demonstrates no acute abnormality. There is no bowel obstruction. No bowel wall thickening. The distal sigmoid colon and rectum are otherwise unremarkable. PERITONEUM AND RETROPERITONEUM: No ascites. No free air. Diffuse left perirectal inflammatory changes are present along the left gluteal fold measuring 7.9 x 6.1 x 7.5 cm. No discrete fluid collection is present. No fistulous tract is evident. VASCULATURE: Atherosclerotic changes are present in the aorta and branch vessels without aneurysm. LYMPH NODES: No lymphadenopathy. REPRODUCTIVE ORGANS: No acute abnormality. BONES AND SOFT TISSUES: No acute osseous abnormality. No focal soft tissue abnormality. IMPRESSION: 1. Diffuse left perirectal inflammatory changes along the left gluteal fold measuring 7.9 x 6.1 x 7.5 cm, without discrete fluid collection or fistulous tract. This likely reflects residual or recurrent infection. Electronically signed by: Lonni Necessary MD 04/07/2024 06:43 AM EDT RP Workstation: [id number]   DG Chest Port 1 View Result Date: 04/07/2024 EXAM: 1 VIEW XRAY OF THE CHEST 04/07/2024 04:45:00 AM COMPARISON: 2 view chest x-ray 06/16/2023. CLINICAL HISTORY: Questionable sepsis - evaluate for abnormality. ? Sepsis, abscess to buttocks, fever, vomiting, urinary retention. FINDINGS: LUNGS AND PLEURA: Lung volumes are low. No focal pulmonary opacity. No pulmonary edema. No pleural effusion. No pneumothorax. HEART AND MEDIASTINUM: No acute abnormality of the cardiac and mediastinal silhouettes. BONES AND SOFT TISSUES: No acute osseous abnormality. IMPRESSION: 1. No acute process. 2. Low lung volumes. Electronically signed by: Lonni Necessary MD 04/07/2024 05:01 AM EDT RP Workstation: [id number]    Anti-infectives: Anti-infectives (From admission, onward)    Start     Dose/Rate Route Frequency Ordered Stop   04/07/24 1400  vancomycin  (VANCOCIN ) IVPB 1000 mg/200 mL premix        1,000 mg 200 mL/hr over 60  Minutes Intravenous Every 12 hours 04/07/24 0953     04/07/24 1300  ceFEPIme  (MAXIPIME ) 2 g in sodium chloride  0.9 % 100 mL IVPB        2 g 200 mL/hr over 30 Minutes Intravenous Every 8 hours 04/07/24 0953     04/07/24 0415  vancomycin  (VANCOCIN ) IVPB 1000 mg/200 mL premix        1,000 mg 200 mL/hr over 60 Minutes Intravenous  Once 04/07/24 0400 04/07/24 0543   04/07/24 0415  ceFEPIme  (MAXIPIME ) 2 g in sodium chloride  0.9 % 100 mL IVPB        2 g 200 mL/hr over 30 Minutes Intravenous  Once 04/07/24 0400 04/07/24 0516        Assessment/Plan POD1- s/p INCISION AND DRAINAGE PERIANAL ABSCESS, Dr. Vernetta, 7/9   -Removed iodoform packing and changed dressing then covered with ABD pad.  -Pain control -IV ABX -Appreciate TRH consult with DM management.  -Monitor glucose.  -Case management consulted to provide assistance with DM management  FEN - Carb modified.  VTE - SCDs, Lovenox   ID - Vancomycin  and cefepime   Foley - In  place for urinary retention  Uncontrolled DM Type 2- on insulin   Diabetic retinopathy HTN   I reviewed nursing notes, hospitalist notes, last 24 h vitals and pain scores, last 48 h intake and output, last 24 h labs and trends, and last 24 h imaging results.     LOS: 1 day    Eulah Hammonds, [facility] 04/08/2024, 10:05 AM Please see Amion for pager number during day hours 7:00am-4:30pm

## 2024-04-09 LAB — CBC
HCT: 32 % — ABNORMAL LOW (ref 39.0–52.0)
Hemoglobin: 11 g/dL — ABNORMAL LOW (ref 13.0–17.0)
MCH: 28.8 pg (ref 26.0–34.0)
MCHC: 34.4 g/dL (ref 30.0–36.0)
MCV: 83.8 fL (ref 80.0–100.0)
Platelets: 290 K/uL (ref 150–400)
RBC: 3.82 MIL/uL — ABNORMAL LOW (ref 4.22–5.81)
RDW: 11.9 % (ref 11.5–15.5)
WBC: 23.5 K/uL — ABNORMAL HIGH (ref 4.0–10.5)
nRBC: 0 % (ref 0.0–0.2)

## 2024-04-09 LAB — C DIFFICILE QUICK SCREEN W PCR REFLEX
C Diff antigen: NEGATIVE
C Diff interpretation: NOT DETECTED
C Diff toxin: NEGATIVE

## 2024-04-09 LAB — COMPREHENSIVE METABOLIC PANEL WITH GFR
ALT: 13 U/L (ref 0–44)
AST: 14 U/L — ABNORMAL LOW (ref 15–41)
Albumin: 2.1 g/dL — ABNORMAL LOW (ref 3.5–5.0)
Alkaline Phosphatase: 152 U/L — ABNORMAL HIGH (ref 38–126)
Anion gap: 8 (ref 5–15)
BUN: 27 mg/dL — ABNORMAL HIGH (ref 6–20)
CO2: 25 mmol/L (ref 22–32)
Calcium: 8.1 mg/dL — ABNORMAL LOW (ref 8.9–10.3)
Chloride: 98 mmol/L (ref 98–111)
Creatinine, Ser: 0.73 mg/dL (ref 0.61–1.24)
GFR, Estimated: >60 mL/min (ref >60)
Glucose, Bld: 395 mg/dL — ABNORMAL HIGH (ref 70–99)
Potassium: 3.6 mmol/L (ref 3.5–5.1)
Sodium: 131 mmol/L — ABNORMAL LOW (ref 135–145)
Total Bilirubin: 1 mg/dL (ref 0.0–1.2)
Total Protein: 5.8 g/dL — ABNORMAL LOW (ref 6.5–8.1)

## 2024-04-09 LAB — MAGNESIUM: Magnesium: 2 mg/dL (ref 1.7–2.4)

## 2024-04-09 LAB — GLUCOSE, CAPILLARY
Glucose-Capillary: 306 mg/dL — ABNORMAL HIGH (ref 70–99)
Glucose-Capillary: 315 mg/dL — ABNORMAL HIGH (ref 70–99)
Glucose-Capillary: 266 mg/dL — ABNORMAL HIGH (ref 70–99)
Glucose-Capillary: 357 mg/dL — ABNORMAL HIGH (ref 70–99)

## 2024-04-09 MED ORDER — INSULIN ASPART 100 UNIT/ML IJ SOLN
0.0000 [IU] | Freq: Every day | INTRAMUSCULAR | Status: DC
  Administered 2024-04-09: 5 [IU] via SUBCUTANEOUS
  Administered 2024-04-11: 2 [IU] via SUBCUTANEOUS

## 2024-04-09 MED ORDER — INSULIN ASPART 100 UNIT/ML IJ SOLN
0.0000 [IU] | Freq: Three times a day (TID) | INTRAMUSCULAR | Status: DC
  Administered 2024-04-09: 8 [IU] via SUBCUTANEOUS
  Administered 2024-04-10 – 2024-04-11 (×5): 11 [IU] via SUBCUTANEOUS
  Administered 2024-04-11 – 2024-04-12 (×2): 5 [IU] via SUBCUTANEOUS
  Administered 2024-04-12: 8 [IU] via SUBCUTANEOUS

## 2024-04-09 MED ORDER — INSULIN GLARGINE-YFGN 100 UNIT/ML ~~LOC~~ SOLN
20.0000 [IU] | Freq: Two times a day (BID) | SUBCUTANEOUS | Status: DC
  Administered 2024-04-09 – 2024-04-10 (×2): 20 [IU] via SUBCUTANEOUS
  Filled 2024-04-09 (×3): qty 0.2

## 2024-04-09 NOTE — Progress Notes (Signed)
 2 Days Post-Op  Subjective: CC: Reports a lot of left buttock pain from incision. Reports 2 loose stools last night and 1 loose, bloody stool at 0600. Also reports LLQ pain. Denies nausea/vomiting.   Objective: Vital signs in last 24 hours: Temp:  [97.8 F (36.6 C)-97.9 F (36.6 C)] 97.8 F (36.6 C) (07/11 0615) Pulse Rate:  [86-89] 86 (07/11 0615) Resp:  [15-16] 15 (07/11 0615) BP: (124-146)/(75-80) 146/79 (07/11 0615) SpO2:  [98 %-100 %] 98 % (07/11 0615) Last BM Date : 04/08/24  Intake/Output from previous day: 07/10 0701 - 07/11 0700 In: 1841.9 [P.O.:1080; IV Piggyback:761.9] Out: 800 [Urine:800] Intake/Output this shift: Total I/O In: 350 [P.O.:350] Out: -   PE: Constitutional:      General: He is not in acute distress.    Appearance: He is not ill-appearing.  Eyes:     Comments: Patient is blind   Cardiovascular:     Rate and Rhythm: Normal rate.  Pulmonary:     Effort: Pulmonary effort is normal.  Genitourinary:    Comments: L buttock incision clean and dry, minimal induration, no cellulitis, penrose in place. Incision cite C/D/I  Skin:    General: Skin is warm and dry.  Neurological:     General: No focal deficit present.     Mental Status: He is alert.  Psychiatric:        Mood and Affect: Mood normal.    Lab Results:  Recent Labs    04/08/24 0329 04/09/24 0328  WBC 23.9* 23.5*  HGB 11.4* 11.0*  HCT 33.1* 32.0*  PLT 249 290   BMET Recent Labs    04/08/24 0329 04/09/24 0328  NA 128* 131*  K 3.7 3.6  CL 96* 98  CO2 20* 25  GLUCOSE 425* 395*  BUN 22* 27*  CREATININE 0.77 0.73  CALCIUM  7.7* 8.1*   PT/INR Recent Labs    04/07/24 0435  LABPROT 14.2  INR 1.0   CMP     Component Value Date/Time   NA 131 (L) 04/09/2024 0328   NA 139 07/14/2023 1609   K 3.6 04/09/2024 0328   CL 98 04/09/2024 0328   CO2 25 04/09/2024 0328   GLUCOSE 395 (H) 04/09/2024 0328   BUN 27 (H) 04/09/2024 0328   BUN 20 07/14/2023 1609   CREATININE 0.73  04/09/2024 0328   CALCIUM  8.1 (L) 04/09/2024 0328   PROT 5.8 (L) 04/09/2024 0328   ALBUMIN 2.1 (L) 04/09/2024 0328   AST 14 (L) 04/09/2024 0328   ALT 13 04/09/2024 0328   ALKPHOS 152 (H) 04/09/2024 0328   BILITOT 1.0 04/09/2024 0328   GFRNONAA >60 04/09/2024 0328   GFRAA >60 07/31/2018 1904   Lipase     Component Value Date/Time   LIPASE 45 08/20/2023 2103    Studies/Results: No results found.   Anti-infectives: Anti-infectives (From admission, onward)    Start     Dose/Rate Route Frequency Ordered Stop   04/07/24 1400  vancomycin  (VANCOCIN ) IVPB 1000 mg/200 mL premix        1,000 mg 200 mL/hr over 60 Minutes Intravenous Every 12 hours 04/07/24 0953     04/07/24 1300  ceFEPIme  (MAXIPIME ) 2 g in sodium chloride  0.9 % 100 mL IVPB        2 g 200 mL/hr over 30 Minutes Intravenous Every 8 hours 04/07/24 0953     04/07/24 0415  vancomycin  (VANCOCIN ) IVPB 1000 mg/200 mL premix        1,000 mg 200  mL/hr over 60 Minutes Intravenous  Once 04/07/24 0400 04/07/24 0543   04/07/24 0415  ceFEPIme  (MAXIPIME ) 2 g in sodium chloride  0.9 % 100 mL IVPB        2 g 200 mL/hr over 30 Minutes Intravenous  Once 04/07/24 0400 04/07/24 0516        Assessment/Plan POD2- s/p INCISION AND DRAINAGE PERIANAL ABSCESS, Dr. Vernetta, 7/9  - AFVSS, having diarrhea, unclear if blood tinged. Suspect it is from surgical incision and not true blood in stool. C.dif negative. Hgb stable 11.0 from 11.4.  - sitz baths ordered TID  -Pain control -IV ABX -Appreciate TRH consult  -Case management consulted to provide assistance with DM management  FEN - Carb modified.  VTE - SCDs, Lovenox   ID - Vancomycin  and cefepime ;  Cx still pending Foley - In place for urinary retention Dispo- med surg, needs to remain inpatient for IV abx and hyperglycemia  Uncontrolled DM Type 2- on insulin ; DM coordinator consult; A1c>15% Diabetic retinopathy HTN   I reviewed nursing notes, hospitalist notes, last 24 h vitals  and pain scores, last 48 h intake and output, last 24 h labs and trends, and last 24 h imaging results.     LOS: 2 days    Almarie GORMAN Pringle, [facility] Surgery 04/09/2024, 9:13 AM Please see Amion for pager number during day hours 7:00am-4:30pm

## 2024-04-09 NOTE — Inpatient Diabetes Management (Signed)
 Inpatient Diabetes Program Recommendations  AACE/ADA: New Consensus Statement on Inpatient Glycemic Control (2015)  Target Ranges:  Prepandial:   less than 140 mg/dL      Peak postprandial:   less than 180 mg/dL (1-2 hours)      Critically ill patients:  140 - 180 mg/dL   Lab Results  Component Value Date   GLUCAP 315 (H) 04/09/2024   HGBA1C >15.0 01/27/2024    Latest Reference Range & Units 04/08/24 07:20 04/08/24 11:45 04/08/24 16:19 04/08/24 21:14 04/09/24 07:38 04/09/24 12:57  Glucose-Capillary 70 - 99 mg/dL 573 (H) 606 (H) 630 (H) 344 (H) 306 (H) 315 (H)  (H): Data is abnormally high  Diabetes history: DM1? Outpatient Diabetes medications: Basaglar  20 units BID (pt states he's taking 40 BID), metformin  500 with breakfast (not taking) Current orders for Inpatient glycemic control: Semglee  15 BID, Novolog  0-15 TID  Inpatient Diabetes Program Recommendations:   Please consider: -Increase Semglee  to 20 units bid (home insulin  dose) -Add Novolog  0-5 units hs correction  Spoke with patient using Spanish video interpretor [id number]. Reviewed again food for lowering CBGs and stablelize.  Thank you, Nannie Starzyk E. Coner Gibbard, RN, MSN, CDCES  Diabetes Coordinator Inpatient Glycemic Control Team Team Pager [phone] (8am-5pm) 04/09/2024 3:22 PM

## 2024-04-09 NOTE — Progress Notes (Signed)
 C-diff test result:  NEGATIVE.  Can we d/c Enteric Precautions?

## 2024-04-09 NOTE — Progress Notes (Addendum)
  Progress Note   Patient: Drew [name] FMW:[id number] DOB: 28-Nov-1975 DOA: 04/07/2024     2 DOS: the patient was seen and examined on 04/09/2024   Brief hospital course: 48 y.o. male with past medical history of CAD, diabetic retinopathy with blindness, hyperlipidemia, hypertension, type 2 diabetes, history of DKA, left gluteal abscess treated with I&D on 08/20/2023 who developed an abscess 2 weeks ago and presented to the emergency department with complaints of intermittent fever, pain, nausea associated with emesis and urinary retention.  He has also had diarrhea.  He was seen twice since last month, given cephalexin  and most recently has been taking Augmentin .  He has not been compliant with his treatment regimen.  Hemoglobin A1c was greater than 15 2-1/2 months ago. He was admitted to the hospital for left gluteal/perianal abscess, s/p incision and drainage, 7/9, on IV antibiotics cefepime  and vancomycin .  Assessment and Plan:  1.Left gluteal/perianal abscess - S/p incision and drainage 7/9 -Continue IV cefepime  and d/c vancomycin  as there is no concern for staph, BC neg, wound c/s looking like strepto, and  GNR - Continue to follow the cultures, BC neg, wound c/s gpc and GNR - Continue pain medications   2.  Nausea and vomiting: Resolved - Continue to follow-up with antiemetics if needed   3.  HTN - Stable - Continue Coreg  and lisinopril    4.  HLD - Continue atorvastatin    5.  CAD - Stable Continue aspirin  and statin and beta-blocker   6.  Diabetic retinopathy/blindness - It appears to be chronic - Will discuss with the patient for outpatient follow-up with ophthalmology at discharge  7.  There was concern about a history of depression/suicidal ideation - Discussed with the patient, he is alert awake and oriented - Denied any active signs of hurting self and others. - Will continue to monitor  8.  Hypokalemia on admission - Replaced and resolved - Continue to monitor  potassium while he is in the hospital       Subjective: Feels better, denies any depression or suicidal ideation  Physical Exam: Vitals:   04/08/24 0918 04/08/24 2100 04/09/24 0615 04/09/24 1037  BP: 124/75 (!) 141/80 (!) 146/79 (!) 140/75  Pulse: 89 89 86 86  Resp: 16 16 15 16   Temp:  97.9 F (36.6 C) 97.8 F (36.6 C) 98.5 F (36.9 C)  TempSrc:    Oral  SpO2: 100% 100% 98% 98%  Weight:      Height:       Constitutional: Alert, awake, calm, comfortable HEENT: Neck supple Respiratory: clear to auscultation bilaterally, no wheezing, no crackles. Normal respiratory effort. No accessory muscle use.  Cardiovascular: Regular rate and rhythm, no murmurs / rubs / gallops. No extremity edema. 2+ pedal pulses. No carotid bruits.  Abdomen: no tenderness, no masses palpated. No hepatosplenomegaly. Bowel sounds positive.  Musculoskeletal: Left gluteal abscess has dressing. Skin: no rashes, lesions, ulcers. No induration Neurologic: CN 2-12 grossly intact. Sensation intact, DTR normal. Strength 5/5 x all 4 extremities.  Psychiatric: Normal judgment and insight. Alert and oriented x 3. Normal mood.   Data Reviewed:  Abscess culture showed gram-positive cocci  Family Communication: None today  Disposition: Status is: Inpatient Remains inpatient appropriate because: Ongoing treatment of left gluteal abscess waiting for cultures  Planned Discharge Destination: Home with Home Health    Time spent: 35 minutes  Author: Nena Rebel, MD 04/09/2024 12:10 PM  For on call review [email or web address].

## 2024-04-09 NOTE — Plan of Care (Signed)
  Problem: Education: Goal: Knowledge of General Education information will improve Description: Including pain rating scale, medication(s)/side effects and non-pharmacologic comfort measures Outcome: Progressing   Problem: Health Behavior/Discharge Planning: Goal: Ability to manage health-related needs will improve Outcome: Progressing   Problem: Clinical Measurements: Goal: Ability to maintain clinical measurements within normal limits will improve Outcome: Progressing Goal: Will remain free from infection Outcome: Progressing Goal: Diagnostic test results will improve Outcome: Progressing Goal: Respiratory complications will improve Outcome: Progressing Goal: Cardiovascular complication will be avoided Outcome: Progressing   Problem: Activity: Goal: Risk for activity intolerance will decrease Outcome: Progressing   Problem: Nutrition: Goal: Adequate nutrition will be maintained Outcome: Completed/Met   Problem: Coping: Goal: Level of anxiety will decrease Outcome: Progressing   Problem: Elimination: Goal: Will not experience complications related to bowel motility Outcome: Progressing Goal: Will not experience complications related to urinary retention Outcome: Completed/Met   Problem: Pain Managment: Goal: General experience of comfort will improve and/or be controlled Outcome: Progressing   Problem: Safety: Goal: Ability to remain free from injury will improve Outcome: Progressing   Problem: Skin Integrity: Goal: Risk for impaired skin integrity will decrease Outcome: Progressing   Problem: Clinical Measurements: Goal: Ability to avoid or minimize complications of infection will improve Outcome: Progressing   Problem: Skin Integrity: Goal: Skin integrity will improve Outcome: Progressing

## 2024-04-09 NOTE — Plan of Care (Signed)

## 2024-04-10 LAB — CBC
HCT: 32.5 % — ABNORMAL LOW (ref 39.0–52.0)
Hemoglobin: 11 g/dL — ABNORMAL LOW (ref 13.0–17.0)
MCH: 28.6 pg (ref 26.0–34.0)
MCHC: 33.8 g/dL (ref 30.0–36.0)
MCV: 84.4 fL (ref 80.0–100.0)
Platelets: 296 K/uL (ref 150–400)
RBC: 3.85 MIL/uL — ABNORMAL LOW (ref 4.22–5.81)
RDW: 11.8 % (ref 11.5–15.5)
WBC: 13.5 K/uL — ABNORMAL HIGH (ref 4.0–10.5)
nRBC: 0 % (ref 0.0–0.2)

## 2024-04-10 LAB — GLUCOSE, CAPILLARY
Glucose-Capillary: 321 mg/dL — ABNORMAL HIGH (ref 70–99)
Glucose-Capillary: 322 mg/dL — ABNORMAL HIGH (ref 70–99)
Glucose-Capillary: 326 mg/dL — ABNORMAL HIGH (ref 70–99)
Glucose-Capillary: 328 mg/dL — ABNORMAL HIGH (ref 70–99)
Glucose-Capillary: 412 mg/dL — ABNORMAL HIGH (ref 70–99)

## 2024-04-10 LAB — MAGNESIUM: Magnesium: 1.8 mg/dL (ref 1.7–2.4)

## 2024-04-10 LAB — BASIC METABOLIC PANEL WITH GFR
Anion gap: 8 (ref 5–15)
BUN: 13 mg/dL (ref 6–20)
CO2: 25 mmol/L (ref 22–32)
Calcium: 7.9 mg/dL — ABNORMAL LOW (ref 8.9–10.3)
Chloride: 99 mmol/L (ref 98–111)
Creatinine, Ser: 0.68 mg/dL (ref 0.61–1.24)
GFR, Estimated: >60 mL/min (ref >60)
Glucose, Bld: 380 mg/dL — ABNORMAL HIGH (ref 70–99)
Potassium: 3.5 mmol/L (ref 3.5–5.1)
Sodium: 132 mmol/L — ABNORMAL LOW (ref 135–145)

## 2024-04-10 MED ORDER — PANTOPRAZOLE SODIUM 40 MG PO TBEC
40.0000 mg | DELAYED_RELEASE_TABLET | Freq: Every day | ORAL | Status: DC
  Administered 2024-04-10 – 2024-04-12 (×3): 40 mg via ORAL
  Filled 2024-04-10 (×3): qty 1

## 2024-04-10 MED ORDER — INSULIN GLARGINE-YFGN 100 UNIT/ML ~~LOC~~ SOLN
22.0000 [IU] | Freq: Two times a day (BID) | SUBCUTANEOUS | Status: DC
  Administered 2024-04-10: 22 [IU] via SUBCUTANEOUS
  Filled 2024-04-10 (×2): qty 0.22

## 2024-04-10 MED ORDER — LOPERAMIDE HCL 2 MG PO CAPS
2.0000 mg | ORAL_CAPSULE | ORAL | Status: DC | PRN
  Administered 2024-04-10: 2 mg via ORAL
  Filled 2024-04-10: qty 1

## 2024-04-10 MED ORDER — INSULIN ASPART 100 UNIT/ML IJ SOLN
6.0000 [IU] | Freq: Once | INTRAMUSCULAR | Status: AC
  Administered 2024-04-10: 6 [IU] via SUBCUTANEOUS

## 2024-04-10 MED ORDER — HYDROMORPHONE HCL 1 MG/ML IJ SOLN
0.5000 mg | INTRAMUSCULAR | Status: DC | PRN
  Administered 2024-04-10 – 2024-04-11 (×2): 0.5 mg via INTRAVENOUS
  Filled 2024-04-10 (×2): qty 0.5

## 2024-04-10 MED ORDER — RISAQUAD PO CAPS
1.0000 | ORAL_CAPSULE | Freq: Every day | ORAL | Status: DC
  Administered 2024-04-10 – 2024-04-12 (×3): 1 via ORAL
  Filled 2024-04-10 (×3): qty 1

## 2024-04-10 NOTE — Progress Notes (Signed)
 AM assessment completed with the assistance of Spanish interpreter via iPad.  Name: Drew [name]  Interpreter ID [id number]

## 2024-04-10 NOTE — Progress Notes (Signed)
 PROGRESS NOTE  Drew [name] FMW:[id number] DOB: 02-25-1976 DOA: 04/07/2024 PCP: [name], MD   LOS: 3 days   Brief narrative:  Patient is a 48 y.o. male with past medical history of CAD, diabetic retinopathy with blindness, hyperlipidemia, hypertension, type 2 diabetes, history of DKA, left gluteal abscess treated with I&D on 08/20/2023 presented to hospital with intermittent fever nausea vomiting and urinary retention and diarrhea.  He was seen twice since last month, given cephalexin  and most recently has been taking Augmentin .  He has not been compliant with his treatment regimen.  Hemoglobin A1c was greater than 15 around 2 and half months ago.  On this admission he was admitted to the hospital for left gluteal/perianal abscess, s/p incision and drainage on 7/9, currently on IV antibiotics cefepime  and vancomycin .     Assessment/Plan: Principal Problem:   Abscess, gluteal, left Active Problems:   Nausea with vomiting   Essential hypertension   Pure hypercholesterolemia   CAD (coronary artery disease)    Left gluteal/perianal abscess Seen by general surgery and underwent incision and drainage on 7/9.  Continue IV cefepime .  Vancomycin  has been discontinued.  Blood cultures negative in 2 days.  Wound culture with gram-negative rods.  Continue to follow sensitivity. Continue pain medications.  Mild leukocytosis present.  Check CBC in AM.   Nausea and vomiting:  Resolved As needed antiemetics.   Essential HTN On Coreg  and lisinopril .  Appears stable   Hyperlipidemia - Continue atorvastatin    Coronary artery disease. No active issues.  Continue e aspirin  and statin and beta-blocker   Diabetic retinopathy/blindness with hyperglycemia. Follow-up with ophthalmology as outpatient.  Continue sliding scale insulin , Accu-Cheks, diabetic diet.  Previous hemoglobin A1c more than 15, two and half of months ago.    Blood cultures negative in 2 days.  Will increase Semglee  to  22 units from 20 units twice daily.  Diabetic coordinator on board.   History of depression/suicidal ideation Currently stable.   Mild hypokalemia.   Has been replenished.  Latest potassium 3.5.  Add an additional dose of 40 mill equivalents of potassium.  DVT prophylaxis: enoxaparin  (LOVENOX ) injection 40 mg Start: 04/08/24 1000 SCDs Start: 04/07/24 1613 SCDs Start: 04/07/24 9074   Disposition: home likely in one to two days  Status is: Inpatient Remains inpatient appropriate because: pending clinical improvement, surgical followup, await culture and sensitivity    Code Status:     Code Status: Full Code  Family Communication: none   Consultants: General surgery   Procedures: incision and drainage of the gluteal perianal abscess on 7/9.   Anti-infectives:  Cefepime   Anti-infectives (From admission, onward)    Start     Dose/Rate Route Frequency Ordered Stop   04/07/24 1400  vancomycin  (VANCOCIN ) IVPB 1000 mg/200 mL premix  Status:  Discontinued        1,000 mg 200 mL/hr over 60 Minutes Intravenous Every 12 hours 04/07/24 0953 04/09/24 1136   04/07/24 1300  ceFEPIme  (MAXIPIME ) 2 g in sodium chloride  0.9 % 100 mL IVPB        2 g 200 mL/hr over 30 Minutes Intravenous Every 8 hours 04/07/24 0953     04/07/24 0415  vancomycin  (VANCOCIN ) IVPB 1000 mg/200 mL premix        1,000 mg 200 mL/hr over 60 Minutes Intravenous  Once 04/07/24 0400 04/07/24 0543   04/07/24 0415  ceFEPIme  (MAXIPIME ) 2 g in sodium chloride  0.9 % 100 mL IVPB        2 g  200 mL/hr over 30 Minutes Intravenous  Once 04/07/24 0400 04/07/24 0516        Subjective: Today, patient was seen and examined at bedside.  Used [place] interpreter.  Mild back and anterior abdominal discomfort.  Had bowel movements.  Denies nausea vomiting fever or chills.    Objective: Vitals:   04/10/24 0529 04/10/24 0826  BP: 135/79 134/86  Pulse: 90 92  Resp: 15 17  Temp: 98.4 F (36.9 C) 97.9 F (36.6 C)  SpO2: 96%  99%    Intake/Output Summary (Last 24 hours) at 04/10/2024 1027 Last data filed at 04/10/2024 0815 Gross per 24 hour  Intake 120 ml  Output --  Net 120 ml   Filed Weights   04/07/24 0131 04/07/24 1239  Weight: 65.8 kg 65.8 kg   Body mass index is 24.13 kg/m.   Physical Exam: GENERAL: Patient is alert awake and oriented. Not in obvious distress. HENT: No scleral pallor or icterus. Pupils equally reactive to light. Oral mucosa is moist NECK: is supple, no gross swelling noted. CHEST: Clear to auscultation. No crackles or wheezes.  Diminished breath sounds bilaterally. CVS: S1 and S2 heard, no murmur. Regular rate and rhythm.  ABDOMEN: Soft, non-tender, bowel sounds are present.  EXTREMITIES: No edema. CNS: Cranial nerves are intact. No focal motor deficits. SKIN: warm and dry, perianal area wound with clean with Penrose drain in place.  Data Review: I have personally reviewed the following laboratory data and studies,  CBC: Recent Labs  Lab 04/07/24 0435 04/08/24 0329 04/09/24 0328 04/10/24 0359  WBC 22.9* 23.9* 23.5* 13.5*  NEUTROABS 18.4*  --   --   --   HGB 12.1* 11.4* 11.0* 11.0*  HCT 34.6* 33.1* 32.0* 32.5*  MCV 82.2 84.4 83.8 84.4  PLT 251 249 290 296   Basic Metabolic Panel: Recent Labs  Lab 04/07/24 0435 04/08/24 0329 04/09/24 0328 04/10/24 0359  NA 132* 128* 131* 132*  K 2.9* 3.7 3.6 3.5  CL 98 96* 98 99  CO2 24 20* 25 25  GLUCOSE 201* 425* 395* 380*  BUN 23* 22* 27* 13  CREATININE 0.65 0.77 0.73 0.68  CALCIUM  8.5* 7.7* 8.1* 7.9*  MG 2.0 2.1 2.0 1.8  PHOS  --  4.7*  --   --    Liver Function Tests: Recent Labs  Lab 04/07/24 0435 04/08/24 0329 04/09/24 0328  AST 16 11* 14*  ALT 13 11 13   ALKPHOS 168* 164* 152*  BILITOT 1.4* 1.4* 1.0  PROT 7.2 5.9* 5.8*  ALBUMIN 2.6* 2.1* 2.1*   No results for input(s): LIPASE, AMYLASE in the last 168 hours. No results for input(s): AMMONIA in the last 168 hours. Cardiac Enzymes: No results for  input(s): CKTOTAL, CKMB, CKMBINDEX, TROPONINI in the last 168 hours. BNP (last 3 results) No results for input(s): BNP in the last 8760 hours.  ProBNP (last 3 results) No results for input(s): PROBNP in the last 8760 hours.  CBG: Recent Labs  Lab 04/09/24 1257 04/09/24 1646 04/09/24 2146 04/10/24 0010 04/10/24 0744  GLUCAP 315* 266* 357* 321* 328*   Recent Results (from the past 240 hours)  Resp panel by RT-PCR (RSV, Flu A&B, Covid) Anterior Nasal Swab     Status: None   Collection Time: 04/07/24  4:21 AM   Specimen: Anterior Nasal Swab  Result Value Ref Range Status   SARS Coronavirus 2 by RT PCR NEGATIVE NEGATIVE Final    Comment: (NOTE) SARS-CoV-2 target nucleic acids are NOT DETECTED.  The  SARS-CoV-2 RNA is generally detectable in upper respiratory specimens during the acute phase of infection. The lowest concentration of SARS-CoV-2 viral copies this assay can detect is 138 copies/mL. A negative result does not preclude SARS-Cov-2 infection and should not be used as the sole basis for treatment or other patient management decisions. A negative result may occur with  improper specimen collection/handling, submission of specimen other than nasopharyngeal swab, presence of viral mutation(s) within the areas targeted by this assay, and inadequate number of viral copies(<138 copies/mL). A negative result must be combined with clinical observations, patient history, and epidemiological information. The expected result is Negative.  Fact Sheet for Patients:  BloggerCourse.com  Fact Sheet for Healthcare Providers:  SeriousBroker.it  This test is no t yet approved or cleared by the [place]  FDA and  has been authorized for detection and/or diagnosis of SARS-CoV-2 by FDA under an Emergency Use Authorization (EUA). This EUA will remain  in effect (meaning this test can be used) for the duration of the COVID-19  declaration under Section 564(b)(1) of the Act, 21 U.S.C.section 360bbb-3(b)(1), unless the authorization is terminated  or revoked sooner.       Influenza A by PCR NEGATIVE NEGATIVE Final   Influenza B by PCR NEGATIVE NEGATIVE Final    Comment: (NOTE) The Xpert Xpress SARS-CoV-2/FLU/RSV plus assay is intended as an aid in the diagnosis of influenza from Nasopharyngeal swab specimens and should not be used as a sole basis for treatment. Nasal washings and aspirates are unacceptable for Xpert Xpress SARS-CoV-2/FLU/RSV testing.  Fact Sheet for Patients: BloggerCourse.com  Fact Sheet for Healthcare Providers: SeriousBroker.it  This test is not yet approved or cleared by the [place]  FDA and has been authorized for detection and/or diagnosis of SARS-CoV-2 by FDA under an Emergency Use Authorization (EUA). This EUA will remain in effect (meaning this test can be used) for the duration of the COVID-19 declaration under Section 564(b)(1) of the Act, 21 U.S.C. section 360bbb-3(b)(1), unless the authorization is terminated or revoked.     Resp Syncytial Virus by PCR NEGATIVE NEGATIVE Final    Comment: (NOTE) Fact Sheet for Patients: BloggerCourse.com  Fact Sheet for Healthcare Providers: SeriousBroker.it  This test is not yet approved or cleared by the [place]  FDA and has been authorized for detection and/or diagnosis of SARS-CoV-2 by FDA under an Emergency Use Authorization (EUA). This EUA will remain in effect (meaning this test can be used) for the duration of the COVID-19 declaration under Section 564(b)(1) of the Act, 21 U.S.C. section 360bbb-3(b)(1), unless the authorization is terminated or revoked.  Performed at [facility], [address], [place], [place] [address]   Blood Culture (routine x 2)     Status: None (Preliminary result)    Collection Time: 04/07/24  4:35 AM   Specimen: BLOOD RIGHT FOREARM  Result Value Ref Range Status   Specimen Description   Final    BLOOD RIGHT FOREARM Performed at [facility], [address], [place], [place] [address]    Special Requests   Final    BOTTLES DRAWN AEROBIC AND ANAEROBIC Blood Culture adequate volume Performed at [facility], [address], [place], [place] [address]    Culture   Final    NO GROWTH 3 DAYS Performed at [facility], [address] [place], [place] [address]    Report Status PENDING  Incomplete  Blood Culture (routine x 2)     Status: None (Preliminary result)  Collection Time: 04/07/24  4:35 AM   Specimen: BLOOD LEFT FOREARM  Result Value Ref Range Status   Specimen Description   Final    BLOOD LEFT FOREARM Performed at [facility], [address], [place], [place] [address]    Special Requests   Final    BOTTLES DRAWN AEROBIC AND ANAEROBIC Blood Culture adequate volume Performed at [facility], [address] [place], [place] [address]    Culture   Final    NO GROWTH 3 DAYS Performed at [facility], [address] [place], [place] [address]    Report Status PENDING  Incomplete  Aerobic/Anaerobic Culture w Gram Stain (surgical/deep wound)     Status: None (Preliminary result)   Collection Time: 04/07/24  3:00 PM   Specimen: Wound; Abscess  Result Value Ref Range Status   Specimen Description   Final    ABSCESS Performed at [facility], [address] [place], [place] [address]    Special Requests   Final    PERIANAL Performed at [facility], [address] [place], [place] [address]    Gram Stain   Final    ABUNDANT WBC PRESENT, PREDOMINANTLY PMN RARE GRAM POSITIVE COCCI IN PAIRS Performed at [facility], [address] [place], [place] [address]    Culture   Final    ABUNDANT GRAM NEGATIVE RODS SUSCEPTIBILITIES  TO FOLLOW NO ANAEROBES ISOLATED; CULTURE IN PROGRESS FOR 5 DAYS    Report Status PENDING  Incomplete  C Difficile Quick Screen w PCR reflex     Status: None   Collection Time: 04/09/24  3:47 AM   Specimen: STOOL  Result Value Ref Range Status   C Diff antigen NEGATIVE NEGATIVE Final   C Diff toxin NEGATIVE NEGATIVE Final   C Diff interpretation No C. difficile detected.  Final    Comment: Performed at [facility], [address] [place], [place] [address]     Studies: No results found.    Danaly Bari, MD  [facility] 04/10/2024  If 7PM-7AM, please contact night-coverage

## 2024-04-10 NOTE — Plan of Care (Signed)
  Problem: Education: Goal: Knowledge of General Education information will improve Description: Including pain rating scale, medication(s)/side effects and non-pharmacologic comfort measures Outcome: Progressing   Problem: Health Behavior/Discharge Planning: Goal: Ability to manage health-related needs will improve Outcome: Progressing   Problem: Clinical Measurements: Goal: Ability to maintain clinical measurements within normal limits will improve Outcome: Progressing Goal: Will remain free from infection Outcome: Progressing Goal: Diagnostic test results will improve Outcome: Progressing Goal: Respiratory complications will improve Outcome: Progressing Goal: Cardiovascular complication will be avoided Outcome: Progressing   Problem: Activity: Goal: Risk for activity intolerance will decrease Outcome: Progressing   Problem: Coping: Goal: Level of anxiety will decrease Outcome: Progressing   Problem: Elimination: Goal: Will not experience complications related to bowel motility Outcome: Progressing   Problem: Pain Managment: Goal: General experience of comfort will improve and/or be controlled Outcome: Progressing   Problem: Safety: Goal: Ability to remain free from injury will improve Outcome: Progressing   Problem: Skin Integrity: Goal: Risk for impaired skin integrity will decrease Outcome: Progressing   Problem: Clinical Measurements: Goal: Ability to avoid or minimize complications of infection will improve Outcome: Progressing   Problem: Skin Integrity: Goal: Skin integrity will improve Outcome: Progressing   Problem: Education: Goal: Ability to describe self-care measures that may prevent or decrease complications (Diabetes Survival Skills Education) will improve Outcome: Progressing Goal: Individualized Educational Video(s) Outcome: Progressing   Problem: Coping: Goal: Ability to adjust to condition or change in health will improve Outcome:  Progressing   Problem: Fluid Volume: Goal: Ability to maintain a balanced intake and output will improve Outcome: Progressing   Problem: Health Behavior/Discharge Planning: Goal: Ability to identify and utilize available resources and services will improve Outcome: Progressing Goal: Ability to manage health-related needs will improve Outcome: Progressing   Problem: Metabolic: Goal: Ability to maintain appropriate glucose levels will improve Outcome: Progressing   Problem: Nutritional: Goal: Maintenance of adequate nutrition will improve Outcome: Progressing Goal: Progress toward achieving an optimal weight will improve Outcome: Progressing   Problem: Skin Integrity: Goal: Risk for impaired skin integrity will decrease Outcome: Progressing   Problem: Tissue Perfusion: Goal: Adequacy of tissue perfusion will improve Outcome: Progressing

## 2024-04-10 NOTE — Inpatient Diabetes Management (Addendum)
 Inpatient Diabetes Program Recommendations  AACE/ADA: New Consensus Statement on Inpatient Glycemic Control (2015)  Target Ranges:  Prepandial:   less than 140 mg/dL      Peak postprandial:   less than 180 mg/dL (1-2 hours)      Critically ill patients:  140 - 180 mg/dL   Lab Results  Component Value Date   GLUCAP 328 (H) 04/10/2024   HGBA1C >15.0 01/27/2024     Latest Reference Range & Units 04/09/24 07:38 04/09/24 12:57 04/09/24 16:46 04/09/24 21:46 04/10/24 00:10 04/10/24 07:44  Glucose-Capillary 70 - 99 mg/dL 693 (H) 684 (H) 733 (H) 357 (H) 321 (H) 328 (H)   Diabetes history: DM1? Outpatient Diabetes medications: Basaglar  20 units BID (pt states he's taking 40 BID), metformin  500 with breakfast (not taking) Current orders for Inpatient glycemic control: Semglee  20 BID, Novolog  0-15 TID +HS  Inpatient Diabetes Program Recommendations:   Please consider: -Increase Semglee  to 25 units bid   Thanks, Clotilda Bull RN, MSN, BC-ADM Inpatient Diabetes Coordinator Team Pager [phone] (8a-5p)

## 2024-04-10 NOTE — Progress Notes (Signed)
 Patient's blood sugar is 412. Messaged [name].

## 2024-04-10 NOTE — Progress Notes (Signed)
 Care turned over for remainder of shift to [facility].  Bedside handoff completed.    04/10/24 1635  Hand-off documentation  Hand-off Given Given to shift RN/LPN  Report given to (Full Name) [facility]

## 2024-04-10 NOTE — Progress Notes (Signed)
 3 Days Post-Op   Subjective/Chief Complaint: No complaints this morning   Objective: Vital signs in last 24 hours: Temp:  [98.4 F (36.9 C)-98.6 F (37 C)] 98.4 F (36.9 C) (07/12 0529) Pulse Rate:  [86-93] 90 (07/12 0529) Resp:  [15-16] 15 (07/12 0529) BP: (135-168)/(75-90) 135/79 (07/12 0529) SpO2:  [96 %-98 %] 96 % (07/12 0529) Last BM Date : 04/08/24  Intake/Output from previous day: 07/11 0701 - 07/12 0700 In: 350 [P.O.:350] Out: -  Intake/Output this shift: No intake/output data recorded.  Exam: Awake and alert Looks comfortable Perianal wound clean, No purulence when probed deep.  Penrose in place  Lab Results:  Recent Labs    04/09/24 0328 04/10/24 0359  WBC 23.5* 13.5*  HGB 11.0* 11.0*  HCT 32.0* 32.5*  PLT 290 296   BMET Recent Labs    04/09/24 0328 04/10/24 0359  NA 131* 132*  K 3.6 3.5  CL 98 99  CO2 25 25  GLUCOSE 395* 380*  BUN 27* 13  CREATININE 0.73 0.68  CALCIUM  8.1* 7.9*   PT/INR No results for input(s): LABPROT, INR in the last 72 hours. ABG No results for input(s): PHART, HCO3 in the last 72 hours.  Invalid input(s): PCO2, PO2  Studies/Results: No results found.  Anti-infectives: Anti-infectives (From admission, onward)    Start     Dose/Rate Route Frequency Ordered Stop   04/07/24 1400  vancomycin  (VANCOCIN ) IVPB 1000 mg/200 mL premix  Status:  Discontinued        1,000 mg 200 mL/hr over 60 Minutes Intravenous Every 12 hours 04/07/24 0953 04/09/24 1136   04/07/24 1300  ceFEPIme  (MAXIPIME ) 2 g in sodium chloride  0.9 % 100 mL IVPB        2 g 200 mL/hr over 30 Minutes Intravenous Every 8 hours 04/07/24 0953     04/07/24 0415  vancomycin  (VANCOCIN ) IVPB 1000 mg/200 mL premix        1,000 mg 200 mL/hr over 60 Minutes Intravenous  Once 04/07/24 0400 04/07/24 0543   04/07/24 0415  ceFEPIme  (MAXIPIME ) 2 g in sodium chloride  0.9 % 100 mL IVPB        2 g 200 mL/hr over 30 Minutes Intravenous  Once 04/07/24 0400  04/07/24 0516       Assessment/Plan: POD3- s/p INCISION AND DRAINAGE PERIANAL ABSCESS, Dr. Vernetta, 7/9   -waiting cultures -WBC down -blood glucose still running high  Will continue IV antibiotics until cultures back. -TRH working with patient's diabetic control -will likely be here through the weekend secondary to diabetes Vicenta Vernetta 04/10/2024

## 2024-04-10 NOTE — Plan of Care (Signed)
 Alert and oriented. Spanish interpreter utilized for communication needs.  Medicated for pain, see MAR for details.  PRN requested from MD regarding diarrhea.  Educated on plan of care.   Problem: Education: Goal: Knowledge of General Education information will improve Description: Including pain rating scale, medication(s)/side effects and non-pharmacologic comfort measures Outcome: Progressing   Problem: Health Behavior/Discharge Planning: Goal: Ability to manage health-related needs will improve Outcome: Progressing   Problem: Clinical Measurements: Goal: Ability to maintain clinical measurements within normal limits will improve Outcome: Progressing Goal: Will remain free from infection Outcome: Progressing

## 2024-04-11 LAB — GLUCOSE, CAPILLARY
Glucose-Capillary: 217 mg/dL — ABNORMAL HIGH (ref 70–99)
Glucose-Capillary: 243 mg/dL — ABNORMAL HIGH (ref 70–99)
Glucose-Capillary: 303 mg/dL — ABNORMAL HIGH (ref 70–99)
Glucose-Capillary: 320 mg/dL — ABNORMAL HIGH (ref 70–99)
Glucose-Capillary: 347 mg/dL — ABNORMAL HIGH (ref 70–99)

## 2024-04-11 LAB — CBC
HCT: 32.9 % — ABNORMAL LOW (ref 39.0–52.0)
Hemoglobin: 11.1 g/dL — ABNORMAL LOW (ref 13.0–17.0)
MCH: 28.2 pg (ref 26.0–34.0)
MCHC: 33.7 g/dL (ref 30.0–36.0)
MCV: 83.7 fL (ref 80.0–100.0)
Platelets: 305 K/uL (ref 150–400)
RBC: 3.93 MIL/uL — ABNORMAL LOW (ref 4.22–5.81)
RDW: 11.7 % (ref 11.5–15.5)
WBC: 9.5 K/uL (ref 4.0–10.5)
nRBC: 0 % (ref 0.0–0.2)

## 2024-04-11 LAB — MAGNESIUM: Magnesium: 1.7 mg/dL (ref 1.7–2.4)

## 2024-04-11 LAB — BASIC METABOLIC PANEL WITH GFR
Anion gap: 8 (ref 5–15)
BUN: 10 mg/dL (ref 6–20)
CO2: 27 mmol/L (ref 22–32)
Calcium: 8.1 mg/dL — ABNORMAL LOW (ref 8.9–10.3)
Chloride: 98 mmol/L (ref 98–111)
Creatinine, Ser: 0.52 mg/dL — ABNORMAL LOW (ref 0.61–1.24)
GFR, Estimated: >60 mL/min (ref >60)
Glucose, Bld: 292 mg/dL — ABNORMAL HIGH (ref 70–99)
Potassium: 3.2 mmol/L — ABNORMAL LOW (ref 3.5–5.1)
Sodium: 133 mmol/L — ABNORMAL LOW (ref 135–145)

## 2024-04-11 MED ORDER — POTASSIUM CHLORIDE CRYS ER 20 MEQ PO TBCR
40.0000 meq | EXTENDED_RELEASE_TABLET | Freq: Two times a day (BID) | ORAL | Status: AC
  Administered 2024-04-11 – 2024-04-12 (×3): 40 meq via ORAL
  Filled 2024-04-11 (×3): qty 2

## 2024-04-11 MED ORDER — FLUCONAZOLE 100 MG PO TABS
100.0000 mg | ORAL_TABLET | Freq: Every day | ORAL | Status: DC
  Administered 2024-04-11 – 2024-04-12 (×2): 100 mg via ORAL
  Filled 2024-04-11 (×2): qty 1

## 2024-04-11 MED ORDER — INSULIN ASPART 100 UNIT/ML IJ SOLN: 4.0000 [IU] | Freq: Three times a day (TID) | INTRAMUSCULAR | Status: DC

## 2024-04-11 MED ORDER — NYSTATIN 100000 UNIT/GM EX CREA
TOPICAL_CREAM | Freq: Two times a day (BID) | CUTANEOUS | Status: DC
  Administered 2024-04-11 (×2): 1 via TOPICAL
  Filled 2024-04-11: qty 30

## 2024-04-11 MED ORDER — INSULIN GLARGINE-YFGN 100 UNIT/ML ~~LOC~~ SOLN
25.0000 [IU] | Freq: Two times a day (BID) | SUBCUTANEOUS | Status: DC
  Administered 2024-04-11 – 2024-04-12 (×3): 25 [IU] via SUBCUTANEOUS
  Filled 2024-04-11 (×4): qty 0.25

## 2024-04-11 MED ORDER — INSULIN ASPART 100 UNIT/ML IJ SOLN
2.0000 [IU] | Freq: Three times a day (TID) | INTRAMUSCULAR | Status: DC
  Administered 2024-04-11 – 2024-04-12 (×4): 2 [IU] via SUBCUTANEOUS

## 2024-04-11 MED ORDER — AMOXICILLIN-POT CLAVULANATE 875-125 MG PO TABS
1.0000 | ORAL_TABLET | Freq: Two times a day (BID) | ORAL | Status: DC
  Administered 2024-04-11 – 2024-04-12 (×3): 1 via ORAL
  Filled 2024-04-11 (×3): qty 1

## 2024-04-11 NOTE — Progress Notes (Signed)
 Pt went to bathroom with one assist

## 2024-04-11 NOTE — Progress Notes (Signed)
 PROGRESS NOTE  Drew [name] FMW:[id number] DOB: [birth year] DOA: 04/07/2024 PCP: [name], MD   LOS: 4 days   Brief narrative:  Patient is a 48 y.o. male with past medical history of CAD, diabetic retinopathy with blindness, hyperlipidemia, hypertension, type 2 diabetes, history of DKA, left gluteal abscess treated with I&D on 08/20/2023 presented to hospital with intermittent fever nausea vomiting and urinary retention and diarrhea.  He was seen twice since last month, given cephalexin  and most recently has been taking Augmentin .  He has not been compliant with his treatment regimen.  Hemoglobin A1c was greater than 15 around 2 and half months ago.  On this admission he was admitted to the hospital for left gluteal/perianal abscess, s/p incision and drainage on 7/9, currently on IV antibiotics cefepime .     Assessment/Plan: Principal Problem:   Abscess, gluteal, left Active Problems:   Nausea with vomiting   Essential hypertension   Pure hypercholesterolemia   CAD (coronary artery disease)    Left gluteal/perianal abscess Seen by general surgery and underwent incision and drainage on 04/07/24.  Achieved IV cefepime ..  Blood cultures negative in 4 days.  Wound culture with gram-negative rods especially E. coli sensitive to multiple antibiotic.  Antibiotics has been changed to Augmentin  at this time..  No fever and leukocytosis has improved.  Communicated with general surgery and plan for disposition home likely tomorrow after removing Penrose drain.  Plan for 5 more days of antibiotic on discharge..   Nausea and vomiting:  Resolved  Penile candidal infection, we will put the patient on the fluconazole  and nystatin  cream.   Essential HTN On Coreg  and lisinopril .  Appears stable   Hyperlipidemia - Continue atorvastatin    Coronary artery disease. No active issues.  Continue  aspirin  and statin and beta-blocker   Diabetic retinopathy/blindness with  hyperglycemia. Follow-up with ophthalmology as outpatient.  Continue sliding scale insulin , Accu-Cheks, diabetic diet.  Previous hemoglobin A1c more than 15, two and half of months ago.    Blood cultures negative in 2 days.  Will increase Semglee  to 22 units from 20 units twice daily.  Diabetic coordinator on board.   History of depression/suicidal ideation Currently stable.   Mild hypokalemia.   Potassium 3.2 today.  Will give her 40 mill equivalents of potassium x 3 doses today.  Check BMP in AM.  DVT prophylaxis: enoxaparin  (LOVENOX ) injection 40 mg Start: 04/08/24 1000 SCDs Start: 04/07/24 1613 SCDs Start: 04/07/24 0925   Disposition: home likely on 04/12/2024  Status is: Inpatient Remains inpatient appropriate because: pending clinical improvement,     Code Status:     Code Status: Full Code  Family Communication: none   Consultants: General surgery   Procedures: incision and drainage of the gluteal perianal abscess on 7/9.   Anti-infectives:  Augmentin   Anti-infectives (From admission, onward)    Start     Dose/Rate Route Frequency Ordered Stop   04/11/24 1215  fluconazole  (DIFLUCAN ) tablet 100 mg        100 mg Oral Daily 04/11/24 1119     04/11/24 1130  amoxicillin -clavulanate (AUGMENTIN ) 875-125 MG per tablet 1 tablet        1 tablet Oral Every 12 hours 04/11/24 1035     04/07/24 1400  vancomycin  (VANCOCIN ) IVPB 1000 mg/200 mL premix  Status:  Discontinued        1,000 mg 200 mL/hr over 60 Minutes Intravenous Every 12 hours 04/07/24 0953 04/09/24 1136   04/07/24 1300  ceFEPIme  (MAXIPIME ) 2 g in  sodium chloride  0.9 % 100 mL IVPB  Status:  Discontinued        2 g 200 mL/hr over 30 Minutes Intravenous Every 8 hours 04/07/24 0953 04/11/24 1035   04/07/24 0415  vancomycin  (VANCOCIN ) IVPB 1000 mg/200 mL premix        1,000 mg 200 mL/hr over 60 Minutes Intravenous  Once 04/07/24 0400 04/07/24 0543   04/07/24 0415  ceFEPIme  (MAXIPIME ) 2 g in sodium chloride  0.9 % 100  mL IVPB        2 g 200 mL/hr over 30 Minutes Intravenous  Once 04/07/24 0400 04/07/24 0516        Subjective: Today, patient was seen and examined at bedside.  Used [place] interpreter.  Still has a mild waist pain and complains of a knot around the penile area.  Objective: Vitals:   04/11/24 0817 04/11/24 1023  BP: 127/80 127/80  Pulse: 88   Resp:    Temp:    SpO2:      Intake/Output Summary (Last 24 hours) at 04/11/2024 1122 Last data filed at 04/11/2024 0857 Gross per 24 hour  Intake 969.21 ml  Output --  Net 969.21 ml   Filed Weights   04/07/24 0131 04/07/24 1239  Weight: 65.8 kg 65.8 kg   Body mass index is 24.13 kg/m.   Physical Exam: GENERAL: Patient is alert awake and oriented. Not in obvious distress. HENT: No scleral pallor or icterus. Pupils equally reactive to light. Oral mucosa is moist NECK: is supple, no gross swelling noted. CHEST: Clear to auscultation. No crackles or wheezes.  Diminished breath sounds bilaterally. CVS: S1 and S2 heard, no murmur. Regular rate and rhythm.  ABDOMEN: Soft, non-tender, bowel sounds are present.  Penile area with lump and whitish patch over the glans penis area/prepuce. EXTREMITIES: No edema. CNS: Cranial nerves are intact. No focal motor deficits. SKIN: warm and dry, perianal area wound with clean with Penrose drain in place.  Data Review: I have personally reviewed the following laboratory data and studies,  CBC: Recent Labs  Lab 04/07/24 0435 04/08/24 0329 04/09/24 0328 04/10/24 0359 04/11/24 0353  WBC 22.9* 23.9* 23.5* 13.5* 9.5  NEUTROABS 18.4*  --   --   --   --   HGB 12.1* 11.4* 11.0* 11.0* 11.1*  HCT 34.6* 33.1* 32.0* 32.5* 32.9*  MCV 82.2 84.4 83.8 84.4 83.7  PLT 251 249 290 296 305   Basic Metabolic Panel: Recent Labs  Lab 04/07/24 0435 04/08/24 0329 04/09/24 0328 04/10/24 0359 04/11/24 0353  NA 132* 128* 131* 132* 133*  K 2.9* 3.7 3.6 3.5 3.2*  CL 98 96* 98 99 98  CO2 24 20* 25 25 27    GLUCOSE 201* 425* 395* 380* 292*  BUN 23* 22* 27* 13 10  CREATININE 0.65 0.77 0.73 0.68 0.52*  CALCIUM  8.5* 7.7* 8.1* 7.9* 8.1*  MG 2.0 2.1 2.0 1.8 1.7  PHOS  --  4.7*  --   --   --    Liver Function Tests: Recent Labs  Lab 04/07/24 0435 04/08/24 0329 04/09/24 0328  AST 16 11* 14*  ALT 13 11 13   ALKPHOS 168* 164* 152*  BILITOT 1.4* 1.4* 1.0  PROT 7.2 5.9* 5.8*  ALBUMIN 2.6* 2.1* 2.1*   No results for input(s): LIPASE, AMYLASE in the last 168 hours. No results for input(s): AMMONIA in the last 168 hours. Cardiac Enzymes: No results for input(s): CKTOTAL, CKMB, CKMBINDEX, TROPONINI in the last 168 hours. BNP (last 3 results) No results  for input(s): BNP in the last 8760 hours.  ProBNP (last 3 results) No results for input(s): PROBNP in the last 8760 hours.  CBG: Recent Labs  Lab 04/10/24 1142 04/10/24 1636 04/10/24 2125 04/11/24 0001 04/11/24 0805  GLUCAP 326* 322* 412* 303* 320*   Recent Results (from the past 240 hours)  Resp panel by RT-PCR (RSV, Flu A&B, Covid) Anterior Nasal Swab     Status: None   Collection Time: 04/07/24  4:21 AM   Specimen: Anterior Nasal Swab  Result Value Ref Range Status   SARS Coronavirus 2 by RT PCR NEGATIVE NEGATIVE Final    Comment: (NOTE) SARS-CoV-2 target nucleic acids are NOT DETECTED.  The SARS-CoV-2 RNA is generally detectable in upper respiratory specimens during the acute phase of infection. The lowest concentration of SARS-CoV-2 viral copies this assay can detect is 138 copies/mL. A negative result does not preclude SARS-Cov-2 infection and should not be used as the sole basis for treatment or other patient management decisions. A negative result may occur with  improper specimen collection/handling, submission of specimen other than nasopharyngeal swab, presence of viral mutation(s) within the areas targeted by this assay, and inadequate number of viral copies(<138 copies/mL). A negative result must  be combined with clinical observations, patient history, and epidemiological information. The expected result is Negative.  Fact Sheet for Patients:  BloggerCourse.com  Fact Sheet for Healthcare Providers:  SeriousBroker.it  This test is no t yet approved or cleared by the [place]  FDA and  has been authorized for detection and/or diagnosis of SARS-CoV-2 by FDA under an Emergency Use Authorization (EUA). This EUA will remain  in effect (meaning this test can be used) for the duration of the COVID-19 declaration under Section 564(b)(1) of the Act, 21 U.S.C.section 360bbb-3(b)(1), unless the authorization is terminated  or revoked sooner.       Influenza A by PCR NEGATIVE NEGATIVE Final   Influenza B by PCR NEGATIVE NEGATIVE Final    Comment: (NOTE) The Xpert Xpress SARS-CoV-2/FLU/RSV plus assay is intended as an aid in the diagnosis of influenza from Nasopharyngeal swab specimens and should not be used as a sole basis for treatment. Nasal washings and aspirates are unacceptable for Xpert Xpress SARS-CoV-2/FLU/RSV testing.  Fact Sheet for Patients: BloggerCourse.com  Fact Sheet for Healthcare Providers: SeriousBroker.it  This test is not yet approved or cleared by the [place]  FDA and has been authorized for detection and/or diagnosis of SARS-CoV-2 by FDA under an Emergency Use Authorization (EUA). This EUA will remain in effect (meaning this test can be used) for the duration of the COVID-19 declaration under Section 564(b)(1) of the Act, 21 U.S.C. section 360bbb-3(b)(1), unless the authorization is terminated or revoked.     Resp Syncytial Virus by PCR NEGATIVE NEGATIVE Final    Comment: (NOTE) Fact Sheet for Patients: BloggerCourse.com  Fact Sheet for Healthcare Providers: SeriousBroker.it  This test is not  yet approved or cleared by the [place]  FDA and has been authorized for detection and/or diagnosis of SARS-CoV-2 by FDA under an Emergency Use Authorization (EUA). This EUA will remain in effect (meaning this test can be used) for the duration of the COVID-19 declaration under Section 564(b)(1) of the Act, 21 U.S.C. section 360bbb-3(b)(1), unless the authorization is terminated or revoked.  Performed at [facility], [address], [place], [place] [address]   Blood Culture (routine x 2)     Status: None (Preliminary result)   Collection Time: 04/07/24  4:35 AM  Specimen: BLOOD RIGHT FOREARM  Result Value Ref Range Status   Specimen Description   Final    BLOOD RIGHT FOREARM Performed at [facility], [address] [place], [place] [address]    Special Requests   Final    BOTTLES DRAWN AEROBIC AND ANAEROBIC Blood Culture adequate volume Performed at [facility], [address] [place], [place] [address]    Culture   Final    NO GROWTH 4 DAYS Performed at [facility], [address] [place], [place] [address]    Report Status PENDING  Incomplete  Blood Culture (routine x 2)     Status: None (Preliminary result)   Collection Time: 04/07/24  4:35 AM   Specimen: BLOOD LEFT FOREARM  Result Value Ref Range Status   Specimen Description   Final    BLOOD LEFT FOREARM Performed at [facility], [address], [place], [place] [address]    Special Requests   Final    BOTTLES DRAWN AEROBIC AND ANAEROBIC Blood Culture adequate volume Performed at [facility], [address], [place], [place] [address]    Culture   Final    NO GROWTH 4 DAYS Performed at [facility], [address] [place], [place] [address]    Report Status PENDING  Incomplete  Aerobic/Anaerobic Culture w Gram Stain (surgical/deep wound)     Status: None (Preliminary result)   Collection Time: 04/07/24  3:00 PM    Specimen: Wound; Abscess  Result Value Ref Range Status   Specimen Description   Final    ABSCESS Performed at [facility], [address], [place], [place] [address]    Special Requests   Final    PERIANAL Performed at [facility], [address], [place], [place] [address]    Gram Stain   Final    ABUNDANT WBC PRESENT, PREDOMINANTLY PMN RARE GRAM POSITIVE COCCI IN PAIRS    Culture   Final    ABUNDANT ESCHERICHIA COLI NO ANAEROBES ISOLATED; CULTURE IN PROGRESS FOR 5 DAYS CULTURE REINCUBATED FOR BETTER GROWTH Performed at [facility], [address], [place], [place] [address]    Report Status PENDING  Incomplete   Organism ID, Bacteria ESCHERICHIA COLI  Final      Susceptibility   Escherichia coli - MIC*    AMPICILLIN <=2 SENSITIVE Sensitive     CEFEPIME  <=0.12 SENSITIVE Sensitive     CEFTAZIDIME <=1 SENSITIVE Sensitive     CEFTRIAXONE <=0.25 SENSITIVE Sensitive     CIPROFLOXACIN  <=0.25 SENSITIVE Sensitive     GENTAMICIN <=1 SENSITIVE Sensitive     IMIPENEM <=0.25 SENSITIVE Sensitive     TRIMETH/SULFA <=20 SENSITIVE Sensitive     AMPICILLIN/SULBACTAM <=2 SENSITIVE Sensitive     PIP/TAZO <=4 SENSITIVE Sensitive ug/mL    * ABUNDANT ESCHERICHIA COLI  C Difficile Quick Screen w PCR reflex     Status: None   Collection Time: 04/09/24  3:47 AM   Specimen: STOOL  Result Value Ref Range Status   C Diff antigen NEGATIVE NEGATIVE Final   C Diff toxin NEGATIVE NEGATIVE Final   C Diff interpretation No C. difficile detected.  Final    Comment: Performed at [facility], [address], [place], [place] [address]     Studies: No results found.    Juanmanuel Marohl, MD  [facility] 04/11/2024  If 7PM-7AM, please contact night-coverage

## 2024-04-11 NOTE — Progress Notes (Signed)
 4 Days Post-Op   Subjective/Chief Complaint: No complaints Pain controlled   Objective: Vital signs in last 24 hours: Temp:  [98.3 F (36.8 C)-98.7 F (37.1 C)] 98.5 F (36.9 C) (07/13 0549) Pulse Rate:  [88-94] 88 (07/13 0817) Resp:  [15-18] 18 (07/13 0549) BP: (127-157)/(72-87) 127/80 (07/13 1023) SpO2:  [96 %-98 %] 96 % (07/13 0549) Last BM Date : 04/10/24  Intake/Output from previous day: 07/12 0701 - 07/13 0700 In: 969.2 [P.O.:600; IV Piggyback:369.2] Out: -  Intake/Output this shift: Total I/O In: 240 [P.O.:240] Out: -   Exam: Wound clean No purulence, penrose in place  Lab Results:  Recent Labs    04/10/24 0359 04/11/24 0353  WBC 13.5* 9.5  HGB 11.0* 11.1*  HCT 32.5* 32.9*  PLT 296 305   BMET Recent Labs    04/10/24 0359 04/11/24 0353  NA 132* 133*  K 3.5 3.2*  CL 99 98  CO2 25 27  GLUCOSE 380* 292*  BUN 13 10  CREATININE 0.68 0.52*  CALCIUM  7.9* 8.1*   PT/INR No results for input(s): LABPROT, INR in the last 72 hours. ABG No results for input(s): PHART, HCO3 in the last 72 hours.  Invalid input(s): PCO2, PO2  Studies/Results: No results found.  Anti-infectives: Anti-infectives (From admission, onward)    Start     Dose/Rate Route Frequency Ordered Stop   04/11/24 1130  amoxicillin -clavulanate (AUGMENTIN ) 875-125 MG per tablet 1 tablet        1 tablet Oral Every 12 hours 04/11/24 1035     04/07/24 1400  vancomycin  (VANCOCIN ) IVPB 1000 mg/200 mL premix  Status:  Discontinued        1,000 mg 200 mL/hr over 60 Minutes Intravenous Every 12 hours 04/07/24 0953 04/09/24 1136   04/07/24 1300  ceFEPIme  (MAXIPIME ) 2 g in sodium chloride  0.9 % 100 mL IVPB  Status:  Discontinued        2 g 200 mL/hr over 30 Minutes Intravenous Every 8 hours 04/07/24 0953 04/11/24 1035   04/07/24 0415  vancomycin  (VANCOCIN ) IVPB 1000 mg/200 mL premix        1,000 mg 200 mL/hr over 60 Minutes Intravenous  Once 04/07/24 0400 04/07/24 0543    04/07/24 0415  ceFEPIme  (MAXIPIME ) 2 g in sodium chloride  0.9 % 100 mL IVPB        2 g 200 mL/hr over 30 Minutes Intravenous  Once 04/07/24 0400 04/07/24 0516       Assessment/Plan: POD4- s/p INCISION AND DRAINAGE PERIANAL ABSCESS, Dr. Vernetta, 7/9   -improving -changing antibiotics to po -CBG's improving -hopefully can remove the penrose drain and d/c tomorrow on 5 more days of antibiotics  Vicenta Vernetta 04/11/2024

## 2024-04-12 ENCOUNTER — Ambulatory Visit: Payer: Self-pay | Admitting: Pharmacist

## 2024-04-12 ENCOUNTER — Other Ambulatory Visit (HOSPITAL_BASED_OUTPATIENT_CLINIC_OR_DEPARTMENT_OTHER): Payer: Self-pay

## 2024-04-12 ENCOUNTER — Other Ambulatory Visit: Payer: Self-pay

## 2024-04-12 ENCOUNTER — Other Ambulatory Visit: Payer: Self-pay | Admitting: Internal Medicine

## 2024-04-12 LAB — CBC
HCT: 35.3 % — ABNORMAL LOW (ref 39.0–52.0)
Hemoglobin: 11.9 g/dL — ABNORMAL LOW (ref 13.0–17.0)
MCH: 28.3 pg (ref 26.0–34.0)
MCHC: 33.7 g/dL (ref 30.0–36.0)
MCV: 84 fL (ref 80.0–100.0)
Platelets: 351 K/uL (ref 150–400)
RBC: 4.2 MIL/uL — ABNORMAL LOW (ref 4.22–5.81)
RDW: 11.8 % (ref 11.5–15.5)
WBC: 9.5 K/uL (ref 4.0–10.5)
nRBC: 0 % (ref 0.0–0.2)

## 2024-04-12 LAB — CULTURE, BLOOD (ROUTINE X 2)
Culture: NO GROWTH
Culture: NO GROWTH
Special Requests: ADEQUATE
Special Requests: ADEQUATE

## 2024-04-12 LAB — AEROBIC/ANAEROBIC CULTURE W GRAM STAIN (SURGICAL/DEEP WOUND)

## 2024-04-12 LAB — BASIC METABOLIC PANEL WITH GFR
Anion gap: 9 (ref 5–15)
BUN: 11 mg/dL (ref 6–20)
CO2: 26 mmol/L (ref 22–32)
Calcium: 8.3 mg/dL — ABNORMAL LOW (ref 8.9–10.3)
Chloride: 98 mmol/L (ref 98–111)
Creatinine, Ser: 0.54 mg/dL — ABNORMAL LOW (ref 0.61–1.24)
GFR, Estimated: >60 mL/min (ref >60)
Glucose, Bld: 255 mg/dL — ABNORMAL HIGH (ref 70–99)
Potassium: 4 mmol/L (ref 3.5–5.1)
Sodium: 133 mmol/L — ABNORMAL LOW (ref 135–145)

## 2024-04-12 LAB — GLUCOSE, CAPILLARY
Glucose-Capillary: 248 mg/dL — ABNORMAL HIGH (ref 70–99)
Glucose-Capillary: 276 mg/dL — ABNORMAL HIGH (ref 70–99)

## 2024-04-12 MED ORDER — NYSTATIN 100000 UNIT/GM EX CREA
TOPICAL_CREAM | Freq: Two times a day (BID) | CUTANEOUS | 0 refills | Status: AC
  Filled 2024-04-12 (×2): qty 30, 30d supply, fill #0

## 2024-04-12 MED ORDER — METHOCARBAMOL 500 MG PO TABS
500.0000 mg | ORAL_TABLET | Freq: Three times a day (TID) | ORAL | 0 refills | Status: AC | PRN
  Filled 2024-04-12: qty 15, 5d supply, fill #0

## 2024-04-12 MED ORDER — FLUCONAZOLE 100 MG PO TABS
100.0000 mg | ORAL_TABLET | Freq: Every day | ORAL | 0 refills | Status: AC
  Filled 2024-04-12: qty 7, 7d supply, fill #0

## 2024-04-12 MED ORDER — AMOXICILLIN-POT CLAVULANATE 875-125 MG PO TABS
1.0000 | ORAL_TABLET | Freq: Two times a day (BID) | ORAL | 0 refills | Status: AC
  Filled 2024-04-12 – 2024-04-13 (×2): qty 10, 5d supply, fill #0

## 2024-04-12 MED ORDER — RISAQUAD PO CAPS
1.0000 | ORAL_CAPSULE | Freq: Every day | ORAL | 0 refills | Status: DC
  Filled 2024-04-12: qty 10, 10d supply, fill #0

## 2024-04-12 MED ORDER — OXYCODONE HCL 5 MG PO TABS
5.0000 mg | ORAL_TABLET | Freq: Four times a day (QID) | ORAL | 0 refills | Status: DC | PRN
  Filled 2024-04-12: qty 15, 4d supply, fill #0

## 2024-04-12 MED ORDER — CIPROFLOXACIN HCL 500 MG PO TABS
500.0000 mg | ORAL_TABLET | Freq: Two times a day (BID) | ORAL | 0 refills | Status: AC
  Filled 2024-04-12: qty 14, 7d supply, fill #0

## 2024-04-12 MED ORDER — BASAGLAR KWIKPEN 100 UNIT/ML ~~LOC~~ SOPN
25.0000 [IU] | PEN_INJECTOR | Freq: Two times a day (BID) | SUBCUTANEOUS | 1 refills | Status: DC
Start: 2024-04-12 — End: 2024-08-13
  Filled 2024-04-12: qty 15, 30d supply, fill #0
  Filled 2024-05-06: qty 15, 30d supply, fill #1
  Filled 2024-06-02: qty 15, 30d supply, fill #2
  Filled 2024-07-05 – 2024-07-21 (×2): qty 15, 30d supply, fill #3

## 2024-04-12 NOTE — Progress Notes (Signed)
 PROGRESS NOTE  Drew [name] FMW:[id number] DOB: [birth year] DOA: 04/07/2024 PCP: [name], MD   LOS: 5 days   Brief narrative:  Patient is a 48 y.o. male with past medical history of CAD, diabetic retinopathy with blindness, hyperlipidemia, hypertension, type 2 diabetes, history of DKA, left gluteal abscess treated with I&D on 08/20/2023 presented to hospital with intermittent fever, nausea, vomiting and urinary retention and diarrhea.  He was seen twice since last month, given cephalexin  and most recently has been taking Augmentin .  He has not been compliant with his treatment regimen.  Hemoglobin A1c was greater than 15 around 2 and half months ago.  On this admission he was admitted to the hospital for left gluteal/perianal abscess, s/p incision and drainage on 7/9, currently on IV antibiotics cefepime .     Assessment/Plan: Principal Problem:   Abscess, gluteal, left Active Problems:   Nausea with vomiting   Essential hypertension   Pure hypercholesterolemia   CAD (coronary artery disease)    Left gluteal/perianal abscess Seen by general surgery and underwent incision and drainage on 04/07/24.  Received IV cefepime ..  Blood cultures negative in 5 days.  Wound culture with gram-negative rods especially E. coli sensitive to multiple antibiotic.  Antibiotics has been changed to Augmentin  at this time..  No fever and leukocytosis has improved.   Plan for 5 more days of antibiotic on discharge..   Nausea and vomiting:  Resolved  Penile candidal infection, on  fluconazole  and nystatin  cream.   Essential HTN On Coreg  and lisinopril .  Appears stable   Hyperlipidemia - Continue atorvastatin    Coronary artery disease. No active issues.  Continue  aspirin  and statin and beta-blocker   Diabetic retinopathy/blindness with hyperglycemia. Follow-up with ophthalmology as outpatient.  On  sliding scale insulin , Accu-Cheks, diabetic diet.  Previous hemoglobin A1c more than 15, two  and half of months ago.    Blood cultures negative in 2 days.  Will increase Semglee  to 25 units from 20 units twice daily.  Diabetic coordinator followed the patient during hospitalization.  Will need good glycemic control as outpatient with his PCP.   History of depression/suicidal ideation Currently stable.   Mild hypokalemia.   Improved after replacement.  Latest potassium of 4.0  DVT prophylaxis: enoxaparin  (LOVENOX ) injection 40 mg Start: 04/08/24 1000 SCDs Start: 04/07/24 1613 SCDs Start: 04/07/24 0925   Disposition: home likely on 04/12/2024.  Medically stable for disposition.  Would recommend outpatient PCP follow-up for better management of his diabetes.  Status is: Inpatient    Code Status:     Code Status: Full Code  Family Communication: none   Consultants: General surgery   Procedures: incision and drainage of the gluteal perianal abscess on 7/9.   Anti-infectives:  Augmentin   Anti-infectives (From admission, onward)    Start     Dose/Rate Route Frequency Ordered Stop   04/12/24 0000  fluconazole  (DIFLUCAN ) 100 MG tablet        100 mg Oral Daily 04/12/24 0944 04/19/24 2359   04/12/24 0000  amoxicillin -clavulanate (AUGMENTIN ) 875-125 MG tablet        1 tablet Oral Every 12 hours 04/12/24 0944 04/17/24 2359   04/11/24 1215  fluconazole  (DIFLUCAN ) tablet 100 mg        100 mg Oral Daily 04/11/24 1119     04/11/24 1130  amoxicillin -clavulanate (AUGMENTIN ) 875-125 MG per tablet 1 tablet        1 tablet Oral Every 12 hours 04/11/24 1035     04/07/24 1400  vancomycin  (VANCOCIN ) IVPB 1000 mg/200 mL premix  Status:  Discontinued        1,000 mg 200 mL/hr over 60 Minutes Intravenous Every 12 hours 04/07/24 0953 04/09/24 1136   04/07/24 1300  ceFEPIme  (MAXIPIME ) 2 g in sodium chloride  0.9 % 100 mL IVPB  Status:  Discontinued        2 g 200 mL/hr over 30 Minutes Intravenous Every 8 hours 04/07/24 0953 04/11/24 1035   04/07/24 0415  vancomycin  (VANCOCIN ) IVPB 1000  mg/200 mL premix        1,000 mg 200 mL/hr over 60 Minutes Intravenous  Once 04/07/24 0400 04/07/24 0543   04/07/24 0415  ceFEPIme  (MAXIPIME ) 2 g in sodium chloride  0.9 % 100 mL IVPB        2 g 200 mL/hr over 30 Minutes Intravenous  Once 04/07/24 0400 04/07/24 0516        Subjective: Today, patient was seen and examined at bedside.  Used [place] interpreter.  Complains of overall feeling better with some back discomfort discomfort.  No nausea vomiting fever chills.  Objective: Vitals:   04/11/24 2142 04/12/24 0559  BP: (!) 145/95 124/74  Pulse: 97 85  Resp: 18 18  Temp: 98.6 F (37 C) 98.1 F (36.7 C)  SpO2: 98% 99%    Intake/Output Summary (Last 24 hours) at 04/12/2024 0958 Last data filed at 04/12/2024 0900 Gross per 24 hour  Intake 840 ml  Output --  Net 840 ml   Filed Weights   04/07/24 0131 04/07/24 1239  Weight: 65.8 kg 65.8 kg   Body mass index is 24.13 kg/m.   Physical Exam: GENERAL: Patient is alert awake and oriented. Not in obvious distress.  Legally blind. HENT: No scleral pallor or icterus. Oral mucosa is moist NECK: is supple, no gross swelling noted. CHEST: Clear to auscultation. No crackles or wheezes.  Diminished breath sounds bilaterally. CVS: S1 and S2 heard, no murmur. Regular rate and rhythm.  ABDOMEN: Soft, non-tender, bowel sounds are present.  Penile area -whitish patch over the glans penis area/prepuce. EXTREMITIES: No edema. CNS: Cranial nerves are intact. No focal motor deficits. SKIN: warm and dry, perianal area wound   Data Review: I have personally reviewed the following laboratory data and studies,  CBC: Recent Labs  Lab 04/07/24 0435 04/08/24 0329 04/09/24 0328 04/10/24 0359 04/11/24 0353 04/12/24 0330  WBC 22.9* 23.9* 23.5* 13.5* 9.5 9.5  NEUTROABS 18.4*  --   --   --   --   --   HGB 12.1* 11.4* 11.0* 11.0* 11.1* 11.9*  HCT 34.6* 33.1* 32.0* 32.5* 32.9* 35.3*  MCV 82.2 84.4 83.8 84.4 83.7 84.0  PLT 251 249 290 296 305  351   Basic Metabolic Panel: Recent Labs  Lab 04/07/24 0435 04/08/24 0329 04/09/24 0328 04/10/24 0359 04/11/24 0353 04/12/24 0330  NA 132* 128* 131* 132* 133* 133*  K 2.9* 3.7 3.6 3.5 3.2* 4.0  CL 98 96* 98 99 98 98  CO2 24 20* 25 25 27 26   GLUCOSE 201* 425* 395* 380* 292* 255*  BUN 23* 22* 27* 13 10 11   CREATININE 0.65 0.77 0.73 0.68 0.52* 0.54*  CALCIUM  8.5* 7.7* 8.1* 7.9* 8.1* 8.3*  MG 2.0 2.1 2.0 1.8 1.7  --   PHOS  --  4.7*  --   --   --   --    Liver Function Tests: Recent Labs  Lab 04/07/24 0435 04/08/24 0329 04/09/24 0328  AST 16 11* 14*  ALT 13  11 13  ALKPHOS 168* 164* 152*  BILITOT 1.4* 1.4* 1.0  PROT 7.2 5.9* 5.8*  ALBUMIN 2.6* 2.1* 2.1*   No results for input(s): LIPASE, AMYLASE in the last 168 hours. No results for input(s): AMMONIA in the last 168 hours. Cardiac Enzymes: No results for input(s): CKTOTAL, CKMB, CKMBINDEX, TROPONINI in the last 168 hours. BNP (last 3 results) No results for input(s): BNP in the last 8760 hours.  ProBNP (last 3 results) No results for input(s): PROBNP in the last 8760 hours.  CBG: Recent Labs  Lab 04/11/24 0805 04/11/24 1151 04/11/24 1634 04/11/24 2143 04/12/24 0726  GLUCAP 320* 347* 217* 243* 248*   Recent Results (from the past 240 hours)  Resp panel by RT-PCR (RSV, Flu A&B, Covid) Anterior Nasal Swab     Status: None   Collection Time: 04/07/24  4:21 AM   Specimen: Anterior Nasal Swab  Result Value Ref Range Status   SARS Coronavirus 2 by RT PCR NEGATIVE NEGATIVE Final    Comment: (NOTE) SARS-CoV-2 target nucleic acids are NOT DETECTED.  The SARS-CoV-2 RNA is generally detectable in upper respiratory specimens during the acute phase of infection. The lowest concentration of SARS-CoV-2 viral copies this assay can detect is 138 copies/mL. A negative result does not preclude SARS-Cov-2 infection and should not be used as the sole basis for treatment or other patient management  decisions. A negative result may occur with  improper specimen collection/handling, submission of specimen other than nasopharyngeal swab, presence of viral mutation(s) within the areas targeted by this assay, and inadequate number of viral copies(<138 copies/mL). A negative result must be combined with clinical observations, patient history, and epidemiological information. The expected result is Negative.  Fact Sheet for Patients:  BloggerCourse.com  Fact Sheet for Healthcare Providers:  SeriousBroker.it  This test is no t yet approved or cleared by the [place]  FDA and  has been authorized for detection and/or diagnosis of SARS-CoV-2 by FDA under an Emergency Use Authorization (EUA). This EUA will remain  in effect (meaning this test can be used) for the duration of the COVID-19 declaration under Section 564(b)(1) of the Act, 21 U.S.C.section 360bbb-3(b)(1), unless the authorization is terminated  or revoked sooner.       Influenza A by PCR NEGATIVE NEGATIVE Final   Influenza B by PCR NEGATIVE NEGATIVE Final    Comment: (NOTE) The Xpert Xpress SARS-CoV-2/FLU/RSV plus assay is intended as an aid in the diagnosis of influenza from Nasopharyngeal swab specimens and should not be used as a sole basis for treatment. Nasal washings and aspirates are unacceptable for Xpert Xpress SARS-CoV-2/FLU/RSV testing.  Fact Sheet for Patients: BloggerCourse.com  Fact Sheet for Healthcare Providers: SeriousBroker.it  This test is not yet approved or cleared by the [place]  FDA and has been authorized for detection and/or diagnosis of SARS-CoV-2 by FDA under an Emergency Use Authorization (EUA). This EUA will remain in effect (meaning this test can be used) for the duration of the COVID-19 declaration under Section 564(b)(1) of the Act, 21 U.S.C. section 360bbb-3(b)(1), unless the  authorization is terminated or revoked.     Resp Syncytial Virus by PCR NEGATIVE NEGATIVE Final    Comment: (NOTE) Fact Sheet for Patients: BloggerCourse.com  Fact Sheet for Healthcare Providers: SeriousBroker.it  This test is not yet approved or cleared by the [place]  FDA and has been authorized for detection and/or diagnosis of SARS-CoV-2 by FDA under an Emergency Use Authorization (EUA). This EUA will remain in effect (meaning this test  can be used) for the duration of the COVID-19 declaration under Section 564(b)(1) of the Act, 21 U.S.C. section 360bbb-3(b)(1), unless the authorization is terminated or revoked.  Performed at [facility], [address], [place], [place] [address]   Blood Culture (routine x 2)     Status: None   Collection Time: 04/07/24  4:35 AM   Specimen: BLOOD RIGHT FOREARM  Result Value Ref Range Status   Specimen Description   Final    BLOOD RIGHT FOREARM Performed at [facility], [address], [place], [place] [address]    Special Requests   Final    BOTTLES DRAWN AEROBIC AND ANAEROBIC Blood Culture adequate volume Performed at [facility], [address] [place], [place] [address]    Culture   Final    NO GROWTH 5 DAYS Performed at [facility], [address], [place], [place] [address]    Report Status 04/12/2024 FINAL  Final  Blood Culture (routine x 2)     Status: None   Collection Time: 04/07/24  4:35 AM   Specimen: BLOOD LEFT FOREARM  Result Value Ref Range Status   Specimen Description   Final    BLOOD LEFT FOREARM Performed at [facility], [address] [place], [place] [address]    Special Requests   Final    BOTTLES DRAWN AEROBIC AND ANAEROBIC Blood Culture adequate volume Performed at [facility], [address] [place], [place] [address]    Culture   Final    NO GROWTH 5 DAYS Performed  at [facility], [address], [place], [place] [address]    Report Status 04/12/2024 FINAL  Final  Aerobic/Anaerobic Culture w Gram Stain (surgical/deep wound)     Status: None (Preliminary result)   Collection Time: 04/07/24  3:00 PM   Specimen: Wound; Abscess  Result Value Ref Range Status   Specimen Description   Final    ABSCESS Performed at [facility], [address], [place], [place] [address]    Special Requests   Final    PERIANAL Performed at [facility], [address], [place], [place] [address]    Gram Stain   Final    ABUNDANT WBC PRESENT, PREDOMINANTLY PMN RARE GRAM POSITIVE COCCI IN PAIRS Performed at [facility], [address], [place], [place] [address]    Culture   Final    ABUNDANT ESCHERICHIA COLI WITHIN MIXED ORGANISMS NO ANAEROBES ISOLATED; CULTURE IN PROGRESS FOR 5 DAYS    Report Status PENDING  Incomplete   Organism ID, Bacteria ESCHERICHIA COLI  Final      Susceptibility   Escherichia coli - MIC*    AMPICILLIN <=2 SENSITIVE Sensitive     CEFEPIME  <=0.12 SENSITIVE Sensitive     CEFTAZIDIME <=1 SENSITIVE Sensitive     CEFTRIAXONE <=0.25 SENSITIVE Sensitive     CIPROFLOXACIN  <=0.25 SENSITIVE Sensitive     GENTAMICIN <=1 SENSITIVE Sensitive     IMIPENEM <=0.25 SENSITIVE Sensitive     TRIMETH/SULFA <=20 SENSITIVE Sensitive     AMPICILLIN/SULBACTAM <=2 SENSITIVE Sensitive     PIP/TAZO <=4 SENSITIVE Sensitive ug/mL    * ABUNDANT ESCHERICHIA COLI  C Difficile Quick Screen w PCR reflex     Status: None   Collection Time: 04/09/24  3:47 AM   Specimen: STOOL  Result Value Ref Range Status   C Diff antigen NEGATIVE NEGATIVE Final   C  Diff toxin NEGATIVE NEGATIVE Final   C Diff interpretation No C. difficile detected.  Final    Comment: Performed at [facility], [address] [place], [place] [address]     Studies: No results found.    Dalante Minus, MD  [facility] 04/12/2024  If 7PM-7AM, please contact night-coverage

## 2024-04-12 NOTE — Inpatient Diabetes Management (Signed)
 Inpatient Diabetes Program Recommendations  AACE/ADA: New Consensus Statement on Inpatient Glycemic Control (2015)  Target Ranges:  Prepandial:   less than 140 mg/dL      Peak postprandial:   less than 180 mg/dL (1-2 hours)      Critically ill patients:  140 - 180 mg/dL   Lab Results  Component Value Date   GLUCAP 248 (H) 04/12/2024   HGBA1C >15.0 01/27/2024    Review of Glycemic Control  Latest Reference Range & Units 04/11/24 08:05 04/11/24 11:51 04/11/24 16:34 04/11/24 21:43 04/12/24 07:26  Glucose-Capillary 70 - 99 mg/dL 679 (H) 652 (H) 782 (H) 243 (H) 248 (H)  (H): Data is abnormally high  Diabetes history: DM1? Outpatient Diabetes medications: Basaglar  20 units BID (pt states he's taking 40 BID), metformin  500 with breakfast (not taking) Current orders for Inpatient glycemic control: Semglee  25 BID, Novolog  0-15 TID +HS, Novolog  2 units TID [facility]  Inpatient Diabetes Program Recommendations:    Semglee  30 units BID Novolog  4 units [facility]  Thank you, Wyvonna Pinal, MSN, CDCES Diabetes Coordinator Inpatient Diabetes Program [phone] (team pager from 8a-5p)

## 2024-04-12 NOTE — Plan of Care (Signed)
  Problem: Coping: Goal: Level of anxiety will decrease Outcome: Progressing   Problem: Elimination: Goal: Will not experience complications related to bowel motility Outcome: Progressing   Problem: Safety: Goal: Ability to remain free from injury will improve Outcome: Progressing   Problem: Pain Managment: Goal: General experience of comfort will improve and/or be controlled Outcome: Progressing

## 2024-04-12 NOTE — Discharge Summary (Signed)
 Patient ID: Drew [name] [id number] 06-Feb-1976 48 y.o.  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 diabetes mellitus (HCC) 07/14/2023   NSTEMI (non-ST elevated myocardial infarction) (HCC) 06/17/2023   Hypertensive urgency 06/17/2023   Hypertensive emergency 06/17/2023   Hyperglycemic crisis due to diabetes mellitus (HCC) 06/17/2023   Headache 06/17/2023   Diabetic retinopathy (HCC) 06/17/2023   Blindness of right eye 06/17/2023   Insulin  dependent type 1 diabetes mellitus (HCC) 06/17/2023   AKI (acute kidney injury) (HCC) 06/17/2023   DKA (diabetic ketoacidosis) (HCC) 02/14/2014   Nausea with vomiting 02/14/2014    Consultants Hospitalist  Reason for Admission: This is a 48 year old Hispanic gentleman with type 1 diabetes who presents with a large left renal abscess. Apparently has had incision and drainage elsewhere in the past. He was recently seen elsewhere and placed on oral antibiotics 2 days ago but because of worsening discomfort presents to the Emergency Department here. He does not speak [place]. He is also blind. He presented with urinary retention as well and a Foley catheter has been placed. He reports no current drainage from the wound but just severe pain in his left perineum   Procedures Incision and drainage of perirectal abscess with penrose drain placement, Dr. Vernetta 04/07/24  Hospital Course:  The patient was admitted and underwent I&D with penrose placement for this abscess.  He was treated with IV abx therapy for 5 days and  then discharged for 5 more days on oral augmentin .  His cxs revealed pansensitive e coli.  His penrose was able to be removed on day of discharge.  His wound was overall clean and healing.   Appreciate the hospitalist care for this patient.  This is from their note: Nausea and vomiting:  Resolved   Penile candidal infection, we will put the patient on the fluconazole  and nystatin  cream.   Essential HTN On Coreg  and lisinopril .  Appears stable   Hyperlipidemia - Continue atorvastatin    Coronary artery disease. No active issues.  Continue  aspirin  and statin and beta-blocker   Diabetic retinopathy/blindness with hyperglycemia. Follow-up with ophthalmology as outpatient.  Continue sliding scale insulin , Accu-Cheks, diabetic diet.  Previous hemoglobin A1c more than 15, two and half of months ago.    Blood cultures negative in 2 days.  Will increase Semglee  to 22 units from 20 units twice daily.  Diabetic coordinator on board.   History of depression/suicidal ideation Currently stable.   Mild hypokalemia.   Potassium 3.2 today.  Will give her 40 mill equivalents of potassium x 3 doses today.  K 4 on day of discharge.    Physical Exam: Gen: NAD Lungs: effort unlabored Skin: perirectal wound is overall clean, some fibrin noted, but no significant purulent drainage. Penrose removed with no issues.  Allergies as of 04/12/2024   No Known Allergies      Medication List     STOP taking these medications    metFORMIN  500 MG 24 hr tablet Commonly known as: GLUCOPHAGE -XR       TAKE these medications    Accu-Chek Guide test strip Generic drug: glucose blood Use  3 (three) times daily as directed to check blood sugar.   Accu-Chek Guide w/Device Kit Use 3 (three) times daily.   acidophilus Caps capsule Take 1 capsule by mouth daily for 10 days.   amLODipine  5 MG tablet Commonly known as: NORVASC  Tome 1 tableta (5 mg en total) por va oral diariamente. (Take 1 tablet (5 mg  total) by mouth daily.)   amoxicillin -clavulanate 875-125 MG tablet Commonly known as: AUGMENTIN  Take 1 tablet by mouth every 12 (twelve) hours for 5 days. What changed: when to take this   atorvastatin  20 MG tablet Commonly known as: LIPITOR Tome 1 tableta (20 mg en total) por va oral diariamente. (Take 1 tablet (20 mg total) by mouth daily.)   Basaglar  KwikPen 100 UNIT/ML Inject 25 Units into the skin 2 (two) times daily. What changed: how much to take   carvedilol  3.125 MG tablet Commonly known as: COREG  Take 1 tablet (3.125 mg total) by mouth 2 (two) times daily with a meal.   fluconazole  100 MG tablet Commonly known as: DIFLUCAN  Take 1 tablet (100 mg total) by mouth daily for 7 days.   gabapentin  300 MG capsule Commonly known as: NEURONTIN  Tome 1 cpsula (300 mg en total) por va oral antes de acostarse. (Take 1 capsule (300 mg total) by mouth at bedtime.)   ibuprofen  600 MG tablet Commonly known as: ADVIL  Take 1 tablet (600 mg total) by mouth every 8 (eight) hours as needed. What changed: reasons to take this   Insulin  Syringe-Needle U-100 31G X 5/16 0.5 ML Misc Commonly known as: TRUEplus Insulin  Syringe Use to inject 70/30 insulin  twice daily.   Insupen Pen Needles 32G X 4 MM Misc Generic drug: Insulin  Pen Needle Use to inject Basaglar  once daily.   lisinopril  2.5 MG tablet Commonly known as: Zestril  Tome 1 tableta (2.5 mg en total) por va oral diariamente. (Take 1 tablet (2.5 mg total) by mouth daily.)   loperamide  2 MG tablet Commonly known as: Imodium  A-D Take 1 tablet (2 mg total) by mouth 3 (three) times daily as needed for diarrhea or loose stools.   methocarbamol  500 MG tablet Commonly known as: ROBAXIN  Take 1 tablet (500 mg total) by mouth every 8 (eight) hours as needed for up to 5 days for muscle spasms.   miconazole  2 % cream Commonly known as: Micatin Apply 1 Application topically 2 (two) times daily.   nystatin  cream Commonly known  as: MYCOSTATIN  Apply topically 2 (two) times daily. Apply to penile area, apply after retracting foreskin   oxyCODONE  5 MG immediate release tablet Commonly known as: Oxy IR/ROXICODONE  Take 1 tablet (5 mg total) by mouth every 6 (six) hours as needed for moderate pain (pain score 4-6) or severe pain (pain score 7-10).          Follow-up Information     [name], MD Follow up in 1 week(s).   Specialty: Internal Medicine Contact information: [address] [place] [address] [phone]         Tari Tonja Barban, PA-C Follow up on 05/06/2024.   Specialty: General Surgery Why: 3:45pm,, Arrive 30 minutes prior to your appointment time, Please bring your insurance card and photo ID Contact information: [address] [facility] [place] [address] [phone]                 Signed: Burnard Banter, [facility] 04/12/2024, 9:44 AM Please see Amion for pager number  during day hours 7:00am-4:30pm, 7-11:30am on Weekends

## 2024-04-12 NOTE — Discharge Instructions (Signed)
 Dry dressing over wound to prevent drainage on your clothes.  Change as needed.  Some drainage on this is expected.

## 2024-04-13 ENCOUNTER — Other Ambulatory Visit: Payer: Self-pay

## 2024-04-13 ENCOUNTER — Other Ambulatory Visit (HOSPITAL_BASED_OUTPATIENT_CLINIC_OR_DEPARTMENT_OTHER): Payer: Self-pay

## 2024-04-15 ENCOUNTER — Encounter: Payer: Self-pay | Admitting: Internal Medicine

## 2024-04-15 ENCOUNTER — Ambulatory Visit: Payer: Self-pay | Attending: Internal Medicine | Admitting: Internal Medicine

## 2024-04-15 ENCOUNTER — Telehealth: Payer: Self-pay | Admitting: Internal Medicine

## 2024-04-15 ENCOUNTER — Telehealth: Payer: Self-pay

## 2024-04-15 VITALS — BP 120/78 | HR 117 | Temp 98.3°F | Ht 65.0 in | Wt 138.0 lb

## 2024-04-15 DIAGNOSIS — L0231 Cutaneous abscess of buttock: Secondary | ICD-10-CM

## 2024-04-15 DIAGNOSIS — E113593 Type 2 diabetes mellitus with proliferative diabetic retinopathy without macular edema, bilateral: Secondary | ICD-10-CM

## 2024-04-15 DIAGNOSIS — Z794 Long term (current) use of insulin: Secondary | ICD-10-CM

## 2024-04-15 DIAGNOSIS — Z09 Encounter for follow-up examination after completed treatment for conditions other than malignant neoplasm: Secondary | ICD-10-CM

## 2024-04-15 LAB — POCT URINALYSIS DIP (CLINITEK)
Bilirubin, UA: NEGATIVE
Glucose, UA: >=1000 mg/dL — AB
Ketones, POC UA: NEGATIVE mg/dL
Leukocytes, UA: NEGATIVE
Nitrite, UA: NEGATIVE
POC PROTEIN,UA: 100 — AB
Spec Grav, UA: 1.015 (ref 1.010–1.025)
Urobilinogen, UA: 0.2 U/dL
pH, UA: 6 (ref 5.0–8.0)

## 2024-04-15 LAB — GLUCOSE, POCT (MANUAL RESULT ENTRY)
POC Glucose: 526 mg/dL — AB (ref 70–99)
POC Glucose: 501 mg/dL — AB (ref 70–99)

## 2024-04-15 MED ORDER — INSULIN ASPART 100 UNIT/ML IJ SOLN
10.0000 [IU] | Freq: Once | INTRAMUSCULAR | Status: AC
  Administered 2024-04-15: 10 [IU] via SUBCUTANEOUS

## 2024-04-15 NOTE — Telephone Encounter (Addendum)
 Transportation Voucher completed.   Pick up date and time: 04/15/2024 4:19 pm.  Pick up address: [address] [employer] [address], [address]  Drop off address: [address] [place] [address]

## 2024-04-15 NOTE — Progress Notes (Signed)
 Patient ID: Drew [name], male    DOB: September 11, 1976  MRN: [id number]  CC: Hospitalization Follow-up (Hospitalization f/u. Emelia L buttocks due to drainage of abscess of buttocks/Painful upper abdomen, SOB due to pain /Episodes of chills at night & sweating )   Subjective: Drew [name] is a 48 y.o. male who presents for chronic ds management. 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  History of Present Illness Drew [name] is a 48 year old male who presents for follow-up after a recent hospitalization for a buttock abscess.  He was recently hospitalized for LT gluteal abscess and was discharged on April 12, 2024. He was prescribed Augmentin  and Cipro , both to be taken twice daily, and he adheres to this regimen. Since discharge, he experienced a fever yesterday and noted that the abscess area was improving until a car ride today caused pain due to bumps. He continues to experience drainage from the site, which is described as bloody and malodorous, though the inflammation and amount of drainage  is decreasing.  Regarding his diabetes management, he was instructed to increase his Glargine insulin  from 20 to 25 units twice daily upon hospital discharge. However, he has been administering 20 units twice daily due to episodes of hypoglycemia, characterized by sweating and tremors. He is unable to check his blood sugar independently due to blindness. He reports eating and drinking fluids adequately.  Blood sugar here today was 526.  He reports that he took his insulin  this morning and last ate around 10 AM.  It is now after 3 PM.    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) 07/14/2023   NSTEMI (non-ST elevated myocardial infarction) (HCC) 06/17/2023   Hypertensive urgency 06/17/2023   Hypertensive emergency 06/17/2023   Hyperglycemic crisis due to diabetes mellitus (HCC) 06/17/2023   Headache 06/17/2023   Diabetic retinopathy (HCC) 06/17/2023   Blindness of right eye 06/17/2023   Insulin  dependent type 1 diabetes mellitus (HCC) 06/17/2023   AKI (acute kidney injury) (HCC) 06/17/2023   DKA (diabetic ketoacidosis) (HCC) 02/14/2014   Nausea with vomiting 02/14/2014     Current Outpatient Medications on File Prior to Visit  Medication Sig Dispense Refill   acidophilus (RISAQUAD) CAPS capsule Take 1 capsule by mouth daily for 10 days. 10 capsule 0   amLODipine  (NORVASC ) 5 MG tablet Take 1 tablet (5 mg total) by mouth daily. 90 tablet 1   amoxicillin -clavulanate (AUGMENTIN ) 875-125 MG tablet Take 1 tablet by mouth every 12 (twelve) hours for 5 days. 10 tablet 0   atorvastatin  (LIPITOR) 20 MG tablet Take 1 tablet (20 mg total) by mouth daily. 90 tablet 1   Blood Glucose Monitoring Suppl (BLOOD GLUCOSE MONITOR SYSTEM) w/Device KIT Use 3 (three) times daily. 1 kit 0   carvedilol  (COREG ) 3.125 MG tablet Take 1 tablet (3.125 mg total) by mouth 2 (two) times daily with a meal. 180 tablet 2  ciprofloxacin  (CIPRO ) 500 MG tablet Take 1 tablet (500 mg total) by mouth 2 (two) times daily for 7 days. 14 tablet 0   fluconazole  (DIFLUCAN ) 100 MG tablet Take 1 tablet (100 mg total) by mouth daily for 7 days. 7 tablet 0   gabapentin  (NEURONTIN ) 300 MG capsule Take 1 capsule (300 mg total) by mouth at bedtime. 90 capsule 3   Glucose Blood (BLOOD GLUCOSE TEST STRIPS) STRP Use 3 (three)  times daily as directed to check blood sugar. 100 strip 0   ibuprofen  (ADVIL ) 600 MG tablet Take 1 tablet (600 mg total) by mouth every 8 (eight) hours as needed. (Patient taking differently: Take 600 mg by mouth every 8 (eight) hours as needed for mild pain (pain score 1-3) or moderate pain (pain score 4-6).) 30 tablet 0   Insulin  Glargine (BASAGLAR  KWIKPEN) 100 UNIT/ML Inject 25 Units into the skin 2 (two) times daily. 36 mL 1   Insulin  Pen Needle (PEN NEEDLES) 32G X 4 MM MISC Use to inject Basaglar  once daily. 100 each 3   Insulin  Syringe-Needle U-100 (TRUEPLUS INSULIN  SYRINGE) 31G X 5/16 0.5 ML MISC Use to inject 70/30 insulin  twice daily. 100 each 6   lisinopril  (ZESTRIL ) 2.5 MG tablet Take 1 tablet (2.5 mg total) by mouth daily. 90 tablet 1   loperamide  (IMODIUM  A-D) 2 MG tablet Take 1 tablet (2 mg total) by mouth 3 (three) times daily as needed for diarrhea or loose stools. 24 tablet 0   methocarbamol  (ROBAXIN ) 500 MG tablet Take 1 tablet (500 mg total) by mouth every 8 (eight) hours as needed for up to 5 days for muscle spasms. 15 tablet 0   miconazole  (MICATIN) 2 % cream Apply 1 Application topically 2 (two) times daily. 30 g 0   nystatin  cream (MYCOSTATIN ) Apply topically 2 (two) times daily. Apply to penile area, apply after retracting foreskin 30 g 0   oxyCODONE  (OXY IR/ROXICODONE ) 5 MG immediate release tablet Take 1 tablet (5 mg total) by mouth every 6 (six) hours as needed for moderate pain (pain score 4-6) or severe pain (pain score 7-10). 15 tablet 0   No current facility-administered medications on file prior to visit.    No Known Allergies  Social History   Socioeconomic History   Marital status: Single    Spouse name: Not on file   Number of children: Not on file   Years of education: Not on file   Highest education level: Not on file  Occupational History   Not on file  Tobacco Use   Smoking status: Never   Smokeless tobacco: Never  Vaping Use   Vaping status:  Never Used  Substance and Sexual Activity   Alcohol use: No   Drug use: No   Sexual activity: Not on file  Other Topics Concern   Not on file  Social History Narrative   Not on file   Social Drivers of Health   Financial Resource Strain: High Risk (01/28/2024)   Overall Financial Resource Strain (CARDIA)    Difficulty of Paying Living Expenses: Very hard  Food Insecurity: Food Insecurity Present (04/07/2024)   Hunger Vital Sign    Worried About Running Out of Food in the Last Year: Sometimes true    Ran Out of Food in the Last Year: Sometimes true  Transportation Needs: Unmet Transportation Needs (04/07/2024)   PRAPARE - Administrator, Civil Service (Medical): Yes    Lack of Transportation (Non-Medical): Yes  Physical Activity: Not  on file  Stress: Not on file  Social Connections: Not on file  Intimate Partner Violence: Not At Risk (04/07/2024)   Humiliation, Afraid, Rape, and Kick questionnaire    Fear of Current or Ex-Partner: No    Emotionally Abused: No    Physically Abused: No    Sexually Abused: No    No family history on file.  Past Surgical History:  Procedure Laterality Date   IRRIGATION AND DEBRIDEMENT BUTTOCKS N/A 04/07/2024   Procedure: IRRIGATION AND DEBRIDEMENT BUTTOCKS;  Surgeon: Vernetta Berg, MD;  Location: [facility];  Service: General;  Laterality: N/A;    ROS: Review of Systems Negative except as stated above  PHYSICAL EXAM: BP 120/78 (BP Location: Left Arm, Patient Position: Sitting, Cuff Size: Normal)   Pulse (!) 117   Temp 98.3 F (36.8 C) (Oral)   Ht 5' 5 (1.651 m)   Wt 138 lb (62.6 kg)   SpO2 98%   BMI 22.96 kg/m   Physical Exam  General appearance - alert, well appearing, and in no distress Mental status - normal mood, behavior, speech, dress, motor activity, and thought processes Mouth: Oral mucosa is moist Chest - clear to auscultation, no wheezes, rales or rhonchi, symmetric air entry Heart -tachycardic but  regular. Rectal -CMA Clarissa present.  On the lower inner aspect of the left buttock he has about a 4 cm incision noted.  There is no surrounding edema or erythema.  Slight induration on the upper border.  Small amount of yellowish discharge noted from the upper end. Results for orders placed or performed in visit on 04/15/24  POCT glucose (manual entry)   Collection Time: 04/15/24  3:31 PM  Result Value Ref Range   POC Glucose 526 (A) 70 - 99 mg/dl  POCT URINALYSIS DIP (CLINITEK)   Collection Time: 04/15/24  6:13 PM  Result Value Ref Range   Color, UA yellow yellow   Clarity, UA clear clear   Glucose, UA >=1,000 (A) negative mg/dL   Bilirubin, UA negative negative   Ketones, POC UA negative negative mg/dL   Spec Grav, UA 8.984 8.989 - 1.025   Blood, UA trace-intact (A) negative   pH, UA 6.0 5.0 - 8.0   POC PROTEIN,UA =100 (A) negative, trace   Urobilinogen, UA 0.2 0.2 or 1.0 E.U./dL   Nitrite, UA Negative Negative   Leukocytes, UA Negative Negative  POCT glucose (manual entry)   Collection Time: 04/15/24  6:15 PM  Result Value Ref Range   POC Glucose 501 (A) 70 - 99 mg/dl       Latest Ref Rng & Units 04/12/2024    3:30 AM 04/11/2024    3:53 AM 04/10/2024    3:59 AM  CMP  Glucose 70 - 99 mg/dL 744  707  619   BUN 6 - 20 mg/dL 11  10  13    Creatinine 0.61 - 1.24 mg/dL 9.45  9.47  9.31   Sodium 135 - 145 mmol/L 133  133  132   Potassium 3.5 - 5.1 mmol/L 4.0  3.2  3.5   Chloride 98 - 111 mmol/L 98  98  99   CO2 22 - 32 mmol/L 26  27  25    Calcium  8.9 - 10.3 mg/dL 8.3  8.1  7.9    Lipid Panel     Component Value Date/Time   CHOL 156 06/17/2023 0415   TRIG 181 (H) 06/17/2023 0415   HDL 43 06/17/2023 0415   CHOLHDL 3.6 06/17/2023 0415  VLDL 36 06/17/2023 0415   LDLCALC 77 06/17/2023 0415    CBC    Component Value Date/Time   WBC 9.5 04/12/2024 0330   RBC 4.20 (L) 04/12/2024 0330   HGB 11.9 (L) 04/12/2024 0330   HCT 35.3 (L) 04/12/2024 0330   PLT 351 04/12/2024  0330   MCV 84.0 04/12/2024 0330   MCH 28.3 04/12/2024 0330   MCHC 33.7 04/12/2024 0330   RDW 11.8 04/12/2024 0330   LYMPHSABS 2.3 04/07/2024 0435   MONOABS 1.7 (H) 04/07/2024 0435   EOSABS 0.0 04/07/2024 0435   BASOSABS 0.1 04/07/2024 0435    ASSESSMENT AND PLAN: 1. Hospital discharge follow-up   2. Gluteal abscess (Primary) Patient to continue Augmentin  and doxycycline until completed.  He reports symptoms are getting better with decreased pain and drainage until today when he rolled in the taxi here.  The bumps on the road increased his pain some.  He will try have the caseworker assist him with keeping follow-up appointment with the general surgeon the early part of next month.  3. 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  Treatment is challenging due to visual impairment.  He is unable to check his blood sugars because of this.  10 units of NovoLog  given today with several cups of water.  He will take his glargine insulin  tonight - POCT glucose (manual entry) - insulin  aspart (novoLOG ) injection 10 Units - Basic Metabolic Panel - POCT URINALYSIS DIP (CLINITEK) - POCT glucose (manual entry)    Patient was given the opportunity to ask questions.  Patient verbalized understanding of the plan and was able to repeat key elements of the plan.   This documentation was completed using Paediatric nurse.  Any transcriptional errors are unintentional.  Orders Placed This Encounter  Procedures   Basic Metabolic Panel   POCT glucose (manual entry)   POCT URINALYSIS DIP (CLINITEK)     Requested Prescriptions    No prescriptions requested or ordered in this encounter    No follow-ups on file.  Barnie Louder, MD, FACP

## 2024-04-15 NOTE — Telephone Encounter (Signed)
 Transportation Voucher completed.   Pick up date and time: 04/15/2024 1:30 pm.  Pick up address: [address] [place]  [address]   Drop off address: [address] [address]

## 2024-04-15 NOTE — Telephone Encounter (Signed)
 I met with the patient when he was in the clinic today for his appointment Drew [name], Spanish Interpreter with [facility] was present.   He came to the appointment alone. He explained  that his rent is now $1000/month and he has a roommate now who will be paying $420/month in rent starting in August and there is another roommate who will be joining them in August who will also be paying $420/month rent.  He has been received some money from a local Past[employer] for rent payments but he still owes at least $1800 and he stated he has no money to pay that,nor does he have money to pay any current rent.  As of now, he said that his landlord has not asked him about the back rent that is due.  The patient has no income.  He said that one of the roommates will be cooking/ preparing meals for him but he is not sure how many meals/ day or week.  He will also need assistance obtaining groceries. He said that Michelle/ [employer] has been helping him with some groceries.   He also said that Rosaline is going to help him find another ophthalmologist for another opinion on the status of his vision loss and possible treatments.  I reminded him that he has already been evaluated by 2 ophthalmologists and we had scheduled him for the 3rd opinion but he needs to pay a $200+ co-pay in order to be seen by the provider. He said he understood and we will wait to see how/if Rosaline is able to assist him.  He said Dr Vicci will be in to meet with him again before he leaves the clinic.

## 2024-04-15 NOTE — Telephone Encounter (Signed)
Noted, routing as FYI

## 2024-04-15 NOTE — Telephone Encounter (Signed)
 Copied from CRM [id number]. Topic: Appointments - Appointment Info/Confirmation >> Apr 15, 2024 11:14 AM [name] wrote:  Patient/patient representative is calling for information regarding an appointment.  I read Titan with interpreter ID [phone] the following: Transportation Voucher completed.  Pick up date and time: 04/15/2024 1:30 pm. Pick up address: [address] [place] [address]  Drop off address: [address] [place], [address] He will be at the appointment today. He had no questions.

## 2024-04-16 ENCOUNTER — Telehealth: Payer: Self-pay | Admitting: Internal Medicine

## 2024-04-16 NOTE — Telephone Encounter (Addendum)
 Transportation Voucher completed, for upcoming appointment April 29, 2024.   Pick up date and time: 04/29/2024 10:50 am.  Pick up address: [address] Drew [name] [facility] [address]   Drop off address:  [address] [place], [address]

## 2024-04-19 ENCOUNTER — Other Ambulatory Visit: Payer: Self-pay

## 2024-04-19 ENCOUNTER — Ambulatory Visit: Payer: Self-pay | Admitting: Surgery

## 2024-04-21 ENCOUNTER — Other Ambulatory Visit: Payer: Self-pay

## 2024-04-21 ENCOUNTER — Emergency Department (HOSPITAL_COMMUNITY): Payer: Self-pay

## 2024-04-21 ENCOUNTER — Ambulatory Visit: Payer: Self-pay

## 2024-04-21 ENCOUNTER — Inpatient Hospital Stay (HOSPITAL_COMMUNITY)
Admission: EM | Admit: 2024-04-21 | Discharge: 2024-04-23 | DRG: 603 | Disposition: A | Discharge status: Home / Self Care | Payer: Self-pay | Attending: Internal Medicine | Admitting: Internal Medicine

## 2024-04-21 ENCOUNTER — Encounter (HOSPITAL_COMMUNITY): Payer: Self-pay

## 2024-04-21 DIAGNOSIS — E785 Hyperlipidemia, unspecified: Secondary | ICD-10-CM | POA: Diagnosis present

## 2024-04-21 DIAGNOSIS — I10 Essential (primary) hypertension: Secondary | ICD-10-CM | POA: Diagnosis present

## 2024-04-21 DIAGNOSIS — R197 Diarrhea, unspecified: Secondary | ICD-10-CM | POA: Diagnosis present

## 2024-04-21 DIAGNOSIS — I2510 Atherosclerotic heart disease of native coronary artery without angina pectoris: Secondary | ICD-10-CM | POA: Diagnosis present

## 2024-04-21 DIAGNOSIS — L0231 Cutaneous abscess of buttock: Principal | ICD-10-CM

## 2024-04-21 DIAGNOSIS — H547 Unspecified visual loss: Secondary | ICD-10-CM | POA: Diagnosis present

## 2024-04-21 DIAGNOSIS — E1165 Type 2 diabetes mellitus with hyperglycemia: Secondary | ICD-10-CM | POA: Diagnosis present

## 2024-04-21 DIAGNOSIS — E1169 Type 2 diabetes mellitus with other specified complication: Secondary | ICD-10-CM | POA: Diagnosis present

## 2024-04-21 DIAGNOSIS — L02215 Cutaneous abscess of perineum: Principal | ICD-10-CM | POA: Diagnosis present

## 2024-04-21 DIAGNOSIS — E11319 Type 2 diabetes mellitus with unspecified diabetic retinopathy without macular edema: Secondary | ICD-10-CM | POA: Diagnosis present

## 2024-04-21 DIAGNOSIS — Z794 Long term (current) use of insulin: Secondary | ICD-10-CM

## 2024-04-21 DIAGNOSIS — Z79899 Other long term (current) drug therapy: Secondary | ICD-10-CM

## 2024-04-21 LAB — CBC WITH DIFFERENTIAL/PLATELET
Abs Immature Granulocytes: 0.04 K/uL (ref 0.00–0.07)
Basophils Absolute: 0.1 K/uL (ref 0.0–0.1)
Basophils Relative: 1 %
Eosinophils Absolute: 0.2 K/uL (ref 0.0–0.5)
Eosinophils Relative: 2 %
HCT: 34.2 % — ABNORMAL LOW (ref 39.0–52.0)
Hemoglobin: 11.6 g/dL — ABNORMAL LOW (ref 13.0–17.0)
Immature Granulocytes: 0 %
Lymphocytes Relative: 27 %
Lymphs Abs: 2.6 K/uL (ref 0.7–4.0)
MCH: 28.5 pg (ref 26.0–34.0)
MCHC: 33.9 g/dL (ref 30.0–36.0)
MCV: 84 fL (ref 80.0–100.0)
Monocytes Absolute: 0.6 K/uL (ref 0.1–1.0)
Monocytes Relative: 6 %
Neutro Abs: 6.2 K/uL (ref 1.7–7.7)
Neutrophils Relative %: 64 %
Platelets: 383 K/uL (ref 150–400)
RBC: 4.07 MIL/uL — ABNORMAL LOW (ref 4.22–5.81)
RDW: 12.3 % (ref 11.5–15.5)
WBC: 9.7 K/uL (ref 4.0–10.5)
nRBC: 0 % (ref 0.0–0.2)

## 2024-04-21 LAB — COMPREHENSIVE METABOLIC PANEL WITH GFR
ALT: 54 U/L — ABNORMAL HIGH (ref 0–44)
AST: 31 U/L (ref 15–41)
Albumin: 2.8 g/dL — ABNORMAL LOW (ref 3.5–5.0)
Alkaline Phosphatase: 191 U/L — ABNORMAL HIGH (ref 38–126)
Anion gap: 6 (ref 5–15)
BUN: 18 mg/dL (ref 6–20)
CO2: 25 mmol/L (ref 22–32)
Calcium: 8.7 mg/dL — ABNORMAL LOW (ref 8.9–10.3)
Chloride: 104 mmol/L (ref 98–111)
Creatinine, Ser: 0.59 mg/dL — ABNORMAL LOW (ref 0.61–1.24)
GFR, Estimated: >60 mL/min (ref >60)
Glucose, Bld: 180 mg/dL — ABNORMAL HIGH (ref 70–99)
Potassium: 3.5 mmol/L (ref 3.5–5.1)
Sodium: 135 mmol/L (ref 135–145)
Total Bilirubin: 0.8 mg/dL (ref 0.0–1.2)
Total Protein: 7.3 g/dL (ref 6.5–8.1)

## 2024-04-21 LAB — CBG MONITORING, ED: Glucose-Capillary: 75 mg/dL (ref 70–99)

## 2024-04-21 MED ORDER — VANCOMYCIN HCL 1500 MG/300ML IV SOLN
1500.0000 mg | Freq: Once | INTRAVENOUS | Status: AC
  Administered 2024-04-21: 1500 mg via INTRAVENOUS
  Filled 2024-04-21: qty 300

## 2024-04-21 MED ORDER — IOHEXOL 300 MG/ML  SOLN
100.0000 mL | Freq: Once | INTRAMUSCULAR | Status: AC | PRN
  Administered 2024-04-21: 100 mL via INTRAVENOUS

## 2024-04-21 MED ORDER — ONDANSETRON HCL 4 MG/2ML IJ SOLN: 4.0000 mg | Freq: Four times a day (QID) | INTRAMUSCULAR | Status: DC | PRN

## 2024-04-21 MED ORDER — CARVEDILOL 3.125 MG PO TABS
3.1250 mg | ORAL_TABLET | Freq: Two times a day (BID) | ORAL | Status: DC
  Administered 2024-04-22 – 2024-04-23 (×3): 3.125 mg via ORAL
  Filled 2024-04-21 (×3): qty 1

## 2024-04-21 MED ORDER — ATORVASTATIN CALCIUM 10 MG PO TABS
20.0000 mg | ORAL_TABLET | Freq: Every day | ORAL | Status: DC
  Administered 2024-04-22 – 2024-04-23 (×2): 20 mg via ORAL
  Filled 2024-04-21 (×2): qty 2

## 2024-04-21 MED ORDER — RISAQUAD PO CAPS
1.0000 | ORAL_CAPSULE | Freq: Every day | ORAL | Status: DC
  Administered 2024-04-22 – 2024-04-23 (×2): 1 via ORAL
  Filled 2024-04-21 (×2): qty 1

## 2024-04-21 MED ORDER — ACETAMINOPHEN 325 MG PO TABS
650.0000 mg | ORAL_TABLET | Freq: Four times a day (QID) | ORAL | Status: DC | PRN
  Administered 2024-04-23: 650 mg via ORAL
  Filled 2024-04-21: qty 2

## 2024-04-21 MED ORDER — FENTANYL CITRATE PF 50 MCG/ML IJ SOSY
50.0000 ug | PREFILLED_SYRINGE | Freq: Once | INTRAMUSCULAR | Status: AC
  Administered 2024-04-21: 50 ug via INTRAVENOUS
  Filled 2024-04-21: qty 1

## 2024-04-21 MED ORDER — ADULT MULTIVITAMIN W/MINERALS CH
1.0000 | ORAL_TABLET | Freq: Every day | ORAL | Status: DC
  Administered 2024-04-22 – 2024-04-23 (×2): 1 via ORAL
  Filled 2024-04-21 (×2): qty 1

## 2024-04-21 MED ORDER — IBUPROFEN 400 MG PO TABS: 600.0000 mg | ORAL_TABLET | Freq: Three times a day (TID) | ORAL | Status: DC | PRN

## 2024-04-21 MED ORDER — SODIUM CHLORIDE 0.9 % IV SOLN
3.0000 g | Freq: Once | INTRAVENOUS | Status: AC
  Administered 2024-04-21: 3 g via INTRAVENOUS
  Filled 2024-04-21: qty 8

## 2024-04-21 MED ORDER — GABAPENTIN 300 MG PO CAPS
300.0000 mg | ORAL_CAPSULE | Freq: Every day | ORAL | Status: DC
  Administered 2024-04-22 (×2): 300 mg via ORAL
  Filled 2024-04-21 (×2): qty 1

## 2024-04-21 MED ORDER — OXYCODONE HCL 5 MG PO TABS
5.0000 mg | ORAL_TABLET | Freq: Four times a day (QID) | ORAL | Status: DC | PRN
  Administered 2024-04-22 – 2024-04-23 (×4): 5 mg via ORAL
  Filled 2024-04-21 (×5): qty 1

## 2024-04-21 MED ORDER — ACETAMINOPHEN 650 MG RE SUPP: 650.0000 mg | Freq: Four times a day (QID) | RECTAL | Status: DC | PRN

## 2024-04-21 MED ORDER — ONDANSETRON HCL 4 MG/2ML IJ SOLN
4.0000 mg | Freq: Once | INTRAMUSCULAR | Status: AC
  Administered 2024-04-21: 4 mg via INTRAVENOUS
  Filled 2024-04-21: qty 2

## 2024-04-21 MED ORDER — SODIUM CHLORIDE 0.9 % IV SOLN
2.0000 g | Freq: Three times a day (TID) | INTRAVENOUS | Status: DC
  Administered 2024-04-22 – 2024-04-23 (×5): 2 g via INTRAVENOUS
  Filled 2024-04-21 (×5): qty 12.5

## 2024-04-21 MED ORDER — AMLODIPINE BESYLATE 5 MG PO TABS
5.0000 mg | ORAL_TABLET | Freq: Every day | ORAL | Status: DC
  Administered 2024-04-22 – 2024-04-23 (×2): 5 mg via ORAL
  Filled 2024-04-21 (×2): qty 1

## 2024-04-21 MED ORDER — SODIUM CHLORIDE 0.9 % IV BOLUS
1000.0000 mL | Freq: Once | INTRAVENOUS | Status: AC
  Administered 2024-04-21: 1000 mL via INTRAVENOUS

## 2024-04-21 MED ORDER — HEPARIN SODIUM (PORCINE) 5000 UNIT/ML IJ SOLN
5000.0000 [IU] | Freq: Three times a day (TID) | INTRAMUSCULAR | Status: DC
  Administered 2024-04-22 – 2024-04-23 (×5): 5000 [IU] via SUBCUTANEOUS
  Filled 2024-04-21 (×5): qty 1

## 2024-04-21 MED ORDER — LOPERAMIDE HCL 2 MG PO CAPS: 2.0000 mg | ORAL_CAPSULE | Freq: Three times a day (TID) | ORAL | Status: DC | PRN

## 2024-04-21 MED ORDER — ONDANSETRON HCL 4 MG PO TABS
4.0000 mg | ORAL_TABLET | Freq: Four times a day (QID) | ORAL | Status: DC | PRN
Start: 2024-04-21 — End: 2024-04-23

## 2024-04-21 MED ORDER — INSULIN GLARGINE-YFGN 100 UNIT/ML ~~LOC~~ SOLN
25.0000 [IU] | Freq: Two times a day (BID) | SUBCUTANEOUS | Status: DC
  Administered 2024-04-22 – 2024-04-23 (×4): 25 [IU] via SUBCUTANEOUS
  Filled 2024-04-21 (×5): qty 0.25

## 2024-04-21 NOTE — Telephone Encounter (Signed)
 FYI Only or Action Required?: Action required by provider: Requesting medication for post op pain.  Patient was last seen in primary care on 04/15/2024 by [name], MD.  Called Nurse Triage reporting Post-op Problem.  Symptoms began 7/9.  Interventions attempted: OTC medications: Oxycodone .  Symptoms are: unchanged.  Triage Disposition: Call PCP Now  Patient/caregiver understands and will follow disposition?: Yes    Copied from CRM #[id number]. Topic: Clinical - Medication Question >> Apr 21, 2024 10:05 AM Myrick T wrote: Reason for CRM: patient called stated he had surgery on his buttocks 15 days ago. He is still in pain and the meds that were given is not working. Patient is requesting a stronger medication. Please f/u with patient  Inerpreter [phone]    Reason for Disposition  [1] SEVERE post-op pain (e.g., excruciating, pain scale 8-10) AND [2] not controlled with pain medications  Answer Assessment - Initial Assessment Questions No appointments available. Patient requesting something for his pain from his recent surgery, stating the prescribed Oxycodone  is not helping his pain. Please advise.      1. SYMPTOM: What's the main symptom you're concerned about? (e.g., pain, fever, vomiting)     Pain 2. ONSET: When did pain start?     7/9 3. SURGERY: What surgery did you have?     Gluteal abscess  4. DATE of SURGERY: When was the surgery?      7/9 7. PAIN: Is there any pain? If Yes, ask: How bad is it?  (Scale 0-10; or none, mild, moderate, severe)     10/10 8. FEVER: Do you have a fever? If Yes, ask: What is your temperature, how was it measured, and when did it start?     Feels feverish 9. VOMITING: Is there any vomiting? If Yes, ask: How many times?     No 10. BLEEDING: Is there any bleeding? If Yes, ask: How much? and Where?       Some bleeding  11. OTHER SYMPTOMS: Do you have any other symptoms? (e.g., drainage from wound,  painful urination, constipation)       Nausea, diarrhea  Protocols used: Post-Op Symptoms and Questions-A-AH

## 2024-04-21 NOTE — Telephone Encounter (Addendum)
 Spoke with patient via in house interpreter. Patient voiced that he has not been feeling well. Voiced that he has had severe pain from recent I&D site to his buttock. Voiced the site is draining a lot. Patient is totally blind unable to see the color or report how much drainage is . He does voice it felt like this morning there was a lot of drainage and he thinks it is bleeding. Voiced that he has been warm with some Sweating. Unsure of what his temperature was but feel like he did have one last night.  Patient unable to notice any odor from the wound. Voiced that the medication he was given for pain is not helping at all. Patient voiced that he had gotten a cream from the [employer] and he was putting it on the wound to his buttock and it has helped with the pain. Patient was unable to tell me the name of the cream. Patient had this surgery on 04/07/2024.   Call to [facility] spoke with triage nurse, Boris. Triage nurse advised the they could possibility work him in tomorrow, But if he can't get there he will need to go to the ED or call 911 to be seen in the ED.   Call to patient. Advised that  I had spoken to [facility] and they can work him I tomorrow to be seen. Patient voiced he can't go because he does not have anyone to go with him. Voiced that he can not see to get to the office if transportation was arranged for him.Advised patient that due to his reported s/s it possible that he may require more antibiotics and or developed another abscess. Patient has agreed for us  to call 911 so he may be seen in the ED.  911 called .

## 2024-04-21 NOTE — H&P (Signed)
 History and Physical    Patient: Drew [name] FMW:[id number] DOB: 11/19/1975 DOA: 04/21/2024 DOS: the patient was seen and examined on 04/21/2024 PCP: [name], MD  Patient coming from: Home  Chief Complaint:  Chief Complaint  Patient presents with   Abscess   HPI: Drew [name] is a 48 y.o. male with medical history significant of clinical blindness secondary to diabetic retinopathy, CAD, hyperlipidemia, recent admission type 2 diabetes mellitus, recent admission for treatment of perineal abscess.  Of note, chart review revealed history of left gluteal abscess. Patient was seen and evaluated by general surgery at that time and is status post incision and drainage.  Patient was discharged home on oral antibiotics.  He presented to the emergency room on account of perineal pain.  He also reports having had profuse diarrhea over the past 6 hours.  Due to patient's clinical blindness, he is unable to visualize if there is any blood or mucus.  Patient admits to subjective fever and chills.  Denies nausea or vomiting.  He denies shortness of breath. Review of Systems: As mentioned in the history of present illness. All other systems reviewed and are negative. Past Medical History:  Diagnosis Date   Abscess, gluteal, left    Coronary artery disease    Diabetic retinopathy (HCC)    Hyperlipidemia associated with type 2 diabetes mellitus (HCC)    Hypertension    Type 2 diabetes mellitus (HCC)    Past Surgical History:  Procedure Laterality Date   IRRIGATION AND DEBRIDEMENT BUTTOCKS N/A 04/07/2024   Procedure: IRRIGATION AND DEBRIDEMENT BUTTOCKS;  Surgeon: Vernetta Berg, MD;  Location: [facility];  Service: General;  Laterality: N/A;   Social History:  reports that he has never smoked. He has never used smokeless tobacco. He reports that he does not drink alcohol and does not use drugs.  No Known Allergies  History reviewed. No pertinent family history.  Prior to Admission  medications   Medication Sig Start Date End Date Taking? Authorizing Provider  acidophilus (RISAQUAD) CAPS capsule Take 1 capsule by mouth daily for 10 days. 04/12/24 04/22/24  Tammy Sor, PA-C  amLODipine  (NORVASC ) 5 MG tablet Take 1 tablet (5 mg total) by mouth daily. 01/19/24 07/18/24  [name], MD  atorvastatin  (LIPITOR) 20 MG tablet Take 1 tablet (20 mg total) by mouth daily. 01/19/24 07/18/24  [name], MD  Blood Glucose Monitoring Suppl (BLOOD GLUCOSE MONITOR SYSTEM) w/Device KIT Use 3 (three) times daily. 06/18/23   Perri DELENA Meliton Mickey., MD  carvedilol  (COREG ) 3.125 MG tablet Take 1 tablet (3.125 mg total) by mouth 2 (two) times daily with a meal. 01/27/24 07/18/24  [name], MD  gabapentin  (NEURONTIN ) 300 MG capsule Take 1 capsule (300 mg total) by mouth at bedtime. 01/27/24   [name], MD  Glucose Blood (BLOOD GLUCOSE TEST STRIPS) STRP Use 3 (three) times daily as directed to check blood sugar. 06/18/23   Perri DELENA Meliton Mickey., MD  ibuprofen  (ADVIL ) 600 MG tablet Take 1 tablet (600 mg total) by mouth every 8 (eight) hours as needed. Patient taking differently: Take 600 mg by mouth every 8 (eight) hours as needed for mild pain (pain score 1-3) or moderate pain (pain score 4-6). 03/01/24   Newlin, Enobong, MD  Insulin  Glargine (BASAGLAR  KWIKPEN) 100 UNIT/ML Inject 25 Units into the skin 2 (two) times daily. 04/12/24   Tammy Sor, PA-C  Insulin  Pen Needle (PEN NEEDLES) 32G X 4 MM MISC Use to inject Basaglar  once  daily. 10/27/23   [name], MD  Insulin  Syringe-Needle U-100 (TRUEPLUS INSULIN  SYRINGE) 31G X 5/16 0.5 ML MISC Use to inject 70/30 insulin  twice daily. 09/19/23   [name], MD  lisinopril  (ZESTRIL ) 2.5 MG tablet Take 1 tablet (2.5 mg total) by mouth daily. 01/27/24   [name], MD  loperamide  (IMODIUM  A-D) 2 MG tablet Take 1 tablet (2 mg total) by mouth 3 (three) times daily as needed for diarrhea or loose stools.  11/25/23   [name], MD  miconazole  (MICATIN) 2 % cream Apply 1 Application topically 2 (two) times daily. 12/05/23   [name], MD  nystatin  cream (MYCOSTATIN ) Apply topically 2 (two) times daily. Apply to penile area, apply after retracting foreskin 04/12/24   Tammy Sor, PA-C  oxyCODONE  (OXY IR/ROXICODONE ) 5 MG immediate release tablet Take 1 tablet (5 mg total) by mouth every 6 (six) hours as needed for moderate pain (pain score 4-6) or severe pain (pain score 7-10). 04/12/24   Tammy Sor, PA-C    Physical Exam: Vitals:   04/21/24 1730 04/21/24 1815 04/21/24 2037 04/21/24 2211  BP: 133/78  (!) 152/80 (!) 152/81  Pulse: 88  (!) 107 (!) 114  Resp: 15  16 17   Temp:  97.9 F (36.6 C)  98 F (36.7 C)  TempSrc:  Oral    SpO2: 100%  100% 100%   General: Patient is a pleasant Hispanic gentleman who was seen laying comfortably in bed. HEENT: Patient is clinically blind.  He is not pale or icteric. Neck: Supple no JVD Chest: Clinically clear without any added sounds Abdomen: Soft with mild tenderness in the left flank. CNS shows no focal deficits Cardiovascular: Regular S1-S2 no murmur Skin: Difficult to assess perineal site.  Perineal drainage at site of previous incisions Data Reviewed: Sodium is 135, potassium 3.5, chloride is 104, bicarb 25, glucose 180, BUN 18, creatinine 0.59, calcium  8.7, AST 31, ALT 54.  WBC 9.7, hemoglobin 11.6, hematocrit 34.2, platelet count 383 CT abdomen and pelvis: Significant for 5 x 1.3 cm left anterior perianal/perineal elongated possibly draining abscess/fistula.   Assessment and Plan: 48 year old male with history of type 1 diabetes mellitus on insulin , recent I&D for perineal abscess.  Presents on account of worsening pain, fever and chills.  Repeat CT scan significant for persistent 5 x 1.3 cm left perianal/perineal abscess.  Patient has been empirically started on IV antibiotics.  General surgery consulted in the ED to reevaluate  for further incision and drainage.  #1.  Left gluteal/perineal abscess: Status post recent I&D on 07/09.  Patient was discharged home on p.o. antibiotics and returns to the emergency room with worsening pain and discharge.  Previous cultures reviewed.  Patient will be empirically covered with IV antibiotics.  General surgery was consulted to evaluate for I&D.  #2.  Increased watery stool since this afternoon.  Etiology is unclear.  Will send stool for workup.  Cdiff less likely at this time.  #3.  Type 2 diabetes mellitus: Patient is on insulin  sliding scale.  Continue with baseline insulin  regimen of glargine 25 units twice daily.  Fingerstick glucose monitoring per protocol.  #4.  Hypertension: Blood pressure stable at this time. Resume on home medications that include Coreg  and lisinopril .  #5.  Hyperlipidemia: Continue with atorvastatin .  #6.  Coronary disease: Asymptomatic at this time.  Continue with aspirin  and statins.  Patient is on beta-blocker at baseline.  #7.  Clinical blindness secondary to diabetic retinopathy.  #8.  VTE prophylaxis with heparin  and SCDs.   Advance Care Planning:   Code Status: Full Code   Consults: General Surgery  Family Communication: No family at bedside  Severity of Illness: The appropriate patient status for this patient is INPATIENT. Inpatient status is judged to be reasonable and necessary in order to provide the required intensity of service to ensure the patient's safety. The patient's presenting symptoms, physical exam findings, and initial radiographic and laboratory data in the context of their chronic comorbidities is felt to place them at high risk for further clinical deterioration. Furthermore, it is not anticipated that the patient will be medically stable for discharge from the hospital within 2 midnights of admission.   * I certify that at the point of admission it is my clinical judgment that the patient will require inpatient hospital  care spanning beyond 2 midnights from the point of admission due to high intensity of service, high risk for further deterioration and high frequency of surveillance required.*  Author: Maude MARLA Dart, MD 04/21/2024 11:03 PM  For on call review [email or web address].

## 2024-04-21 NOTE — Progress Notes (Signed)
 ED Pharmacy Antibiotic Sign Off An antibiotic consult was received from an ED provider for vancomycin  per pharmacy dosing for wound infection. A chart review was completed to assess appropriateness.   The following one time order(s) were placed:  Vancomycin  1500 mg  Further antibiotic and/or antibiotic pharmacy consults should be ordered by the admitting provider if indicated.   Thank you for allowing pharmacy to be a part of this patient's care.   Stefano MARLA Bologna, PharmD, BCPS Clinical Pharmacist 04/21/2024 6:50 PM

## 2024-04-21 NOTE — ED Notes (Signed)
 Patient transported to CT

## 2024-04-21 NOTE — ED Triage Notes (Signed)
 Pt reports with buttock pain from an abscess earlier this month. Pt states that it may be infected.

## 2024-04-21 NOTE — ED Provider Notes (Signed)
 [place] EMERGENCY DEPARTMENT AT [facility] Provider Note   CSN: [id number] Arrival date & time: 04/21/24  1328     Patient presents with: Abscess   Drew [name] is a 48 y.o. male who presents emergency department for evaluation of perineal abscess.  He is status post I&D on 04/07/2024 in the OR with Dr. Vernetta.  He has a past history of insulin -dependent diabetes with diabetic retinopathy and is legally blind.  Professional translation services utilized.  Patient reports that over the past 48 hours he has had progressive pain in the area of incision and drainage with worsening discharge and is concerned that he may now have a recurrent infection.  {Add pertinent medical, surgical, social history, OB history to HPI:32947}  Abscess      Prior to Admission medications   Medication Sig Start Date End Date Taking? Authorizing Provider  acidophilus (RISAQUAD) CAPS capsule Take 1 capsule by mouth daily for 10 days. 04/12/24 04/22/24  Tammy Sor, PA-C  amLODipine  (NORVASC ) 5 MG tablet Take 1 tablet (5 mg total) by mouth daily. 01/19/24 07/18/24  [name], MD  atorvastatin  (LIPITOR) 20 MG tablet Take 1 tablet (20 mg total) by mouth daily. 01/19/24 07/18/24  [name], MD  Blood Glucose Monitoring Suppl (BLOOD GLUCOSE MONITOR SYSTEM) w/Device KIT Use 3 (three) times daily. 06/18/23   Perri DELENA Meliton Mickey., MD  carvedilol  (COREG ) 3.125 MG tablet Take 1 tablet (3.125 mg total) by mouth 2 (two) times daily with a meal. 01/27/24 07/18/24  [name], MD  gabapentin  (NEURONTIN ) 300 MG capsule Take 1 capsule (300 mg total) by mouth at bedtime. 01/27/24   [name], MD  Glucose Blood (BLOOD GLUCOSE TEST STRIPS) STRP Use 3 (three) times daily as directed to check blood sugar. 06/18/23   Perri DELENA Meliton Mickey., MD  ibuprofen  (ADVIL ) 600 MG tablet Take 1 tablet (600 mg total) by mouth every 8 (eight) hours as needed. Patient taking differently: Take 600  mg by mouth every 8 (eight) hours as needed for mild pain (pain score 1-3) or moderate pain (pain score 4-6). 03/01/24   Newlin, Enobong, MD  Insulin  Glargine (BASAGLAR  KWIKPEN) 100 UNIT/ML Inject 25 Units into the skin 2 (two) times daily. 04/12/24   Tammy Sor, PA-C  Insulin  Pen Needle (PEN NEEDLES) 32G X 4 MM MISC Use to inject Basaglar  once daily. 10/27/23   [name], MD  Insulin  Syringe-Needle U-100 (TRUEPLUS INSULIN  SYRINGE) 31G X 5/16 0.5 ML MISC Use to inject 70/30 insulin  twice daily. 09/19/23   [name], MD  lisinopril  (ZESTRIL ) 2.5 MG tablet Take 1 tablet (2.5 mg total) by mouth daily. 01/27/24   [name], MD  loperamide  (IMODIUM  A-D) 2 MG tablet Take 1 tablet (2 mg total) by mouth 3 (three) times daily as needed for diarrhea or loose stools. 11/25/23   [name], MD  miconazole  (MICATIN) 2 % cream Apply 1 Application topically 2 (two) times daily. 12/05/23   [name], MD  nystatin  cream (MYCOSTATIN ) Apply topically 2 (two) times daily. Apply to penile area, apply after retracting foreskin 04/12/24   Tammy Sor, PA-C  oxyCODONE  (OXY IR/ROXICODONE ) 5 MG immediate release tablet Take 1 tablet (5 mg total) by mouth every 6 (six) hours as needed for moderate pain (pain score 4-6) or severe pain (pain score 7-10). 04/12/24   Tammy Sor, PA-C    Allergies: Patient has no known allergies.    Review of Systems  Updated Vital Signs BP 133/78 (BP Location: Left Arm)   Pulse 88   Temp 97.9 F (36.6 C) (Oral)   Resp 15   SpO2 100%   Physical Exam Vitals and nursing note reviewed.  Constitutional:      General: He is not in acute distress.    Appearance: He is well-developed. He is not diaphoretic.  HENT:     Head: Normocephalic and atraumatic.  Eyes:     General: No scleral icterus.    Conjunctiva/sclera: Conjunctivae normal.  Cardiovascular:     Rate and Rhythm: Normal rate and regular rhythm.     Heart sounds: Normal heart  sounds.  Pulmonary:     Effort: Pulmonary effort is normal. No respiratory distress.     Breath sounds: Normal breath sounds.  Abdominal:     Palpations: Abdomen is soft.     Tenderness: There is no abdominal tenderness.  Genitourinary:    Comments: Exam chaperoned.  There is a large surgical incision in the perineal region on the right buttock.  There appears to be some macerated necrotic tissue within the wound and purulent drainage noted from the wound with some surrounding erythema and exquisite tenderness. Musculoskeletal:     Cervical back: Normal range of motion and neck supple.  Skin:    General: Skin is warm and dry.  Neurological:     Mental Status: He is alert.  Psychiatric:        Behavior: Behavior normal.     (all labs ordered are listed, but only abnormal results are displayed) Labs Reviewed  CBC WITH DIFFERENTIAL/PLATELET - Abnormal; Notable for the following components:      Result Value   RBC 4.07 (*)    Hemoglobin 11.6 (*)    HCT 34.2 (*)    All other components within normal limits  COMPREHENSIVE METABOLIC PANEL WITH GFR - Abnormal; Notable for the following components:   Glucose, Bld 180 (*)    Creatinine, Ser 0.59 (*)    Calcium  8.7 (*)    Albumin 2.8 (*)    ALT 54 (*)    Alkaline Phosphatase 191 (*)    All other components within normal limits    EKG: None  Radiology: No results found.  {Document cardiac monitor, telemetry assessment procedure when appropriate:32947} Procedures   Medications Ordered in the ED - No data to display    {Click here for ABCD2, HEART and other calculators REFRESH Note before signing:1}                              Medical Decision Making Amount and/or Complexity of Data Reviewed Labs: ordered.   ***  {Document critical care time when appropriate  Document review of labs and clinical decision tools ie CHADS2VASC2, etc  Document your independent review of radiology images and any outside records  Document  your discussion with family members, caretakers and with consultants  Document social determinants of health affecting pt's care  Document your decision making why or why not admission, treatments were needed:32947:::1}   Final diagnoses:  None    ED Discharge Orders     None

## 2024-04-22 ENCOUNTER — Other Ambulatory Visit (HOSPITAL_COMMUNITY): Payer: Self-pay

## 2024-04-22 LAB — C DIFFICILE QUICK SCREEN W PCR REFLEX
C Diff antigen: NEGATIVE
C Diff interpretation: NOT DETECTED
C Diff toxin: NEGATIVE

## 2024-04-22 LAB — BASIC METABOLIC PANEL WITH GFR
Anion gap: 5 (ref 5–15)
BUN: 14 mg/dL (ref 6–20)
CO2: 22 mmol/L (ref 22–32)
Calcium: 8.4 mg/dL — ABNORMAL LOW (ref 8.9–10.3)
Chloride: 108 mmol/L (ref 98–111)
Creatinine, Ser: 0.6 mg/dL — ABNORMAL LOW (ref 0.61–1.24)
GFR, Estimated: >60 mL/min (ref >60)
Glucose, Bld: 294 mg/dL — ABNORMAL HIGH (ref 70–99)
Potassium: 3.3 mmol/L — ABNORMAL LOW (ref 3.5–5.1)
Sodium: 135 mmol/L (ref 135–145)

## 2024-04-22 LAB — GLUCOSE, CAPILLARY
Glucose-Capillary: 133 mg/dL — ABNORMAL HIGH (ref 70–99)
Glucose-Capillary: 171 mg/dL — ABNORMAL HIGH (ref 70–99)
Glucose-Capillary: 197 mg/dL — ABNORMAL HIGH (ref 70–99)
Glucose-Capillary: 295 mg/dL — ABNORMAL HIGH (ref 70–99)
Glucose-Capillary: 356 mg/dL — ABNORMAL HIGH (ref 70–99)

## 2024-04-22 LAB — CBC
HCT: 33.3 % — ABNORMAL LOW (ref 39.0–52.0)
Hemoglobin: 11.7 g/dL — ABNORMAL LOW (ref 13.0–17.0)
MCH: 29.5 pg (ref 26.0–34.0)
MCHC: 35.1 g/dL (ref 30.0–36.0)
MCV: 83.9 fL (ref 80.0–100.0)
Platelets: 352 K/uL (ref 150–400)
RBC: 3.97 MIL/uL — ABNORMAL LOW (ref 4.22–5.81)
RDW: 12.5 % (ref 11.5–15.5)
WBC: 10.1 K/uL (ref 4.0–10.5)
nRBC: 0 % (ref 0.0–0.2)

## 2024-04-22 MED ORDER — INSULIN ASPART 100 UNIT/ML IJ SOLN
0.0000 [IU] | Freq: Three times a day (TID) | INTRAMUSCULAR | Status: DC
  Administered 2024-04-22 (×2): 4 [IU] via SUBCUTANEOUS
  Administered 2024-04-23: 11 [IU] via SUBCUTANEOUS
  Administered 2024-04-23: 4 [IU] via SUBCUTANEOUS

## 2024-04-22 MED ORDER — INSULIN ASPART 100 UNIT/ML IJ SOLN
0.0000 [IU] | Freq: Every day | INTRAMUSCULAR | Status: DC
  Administered 2024-04-22: 5 [IU] via SUBCUTANEOUS

## 2024-04-22 NOTE — Plan of Care (Signed)

## 2024-04-22 NOTE — TOC Initial Note (Addendum)
 Transition of Care [facility]) - Initial/Assessment Note    Patient Details  Name: Drew [name] MRN: [id number] Date of Birth: 1975/10/22  [facility] CM/SW Contact:    Alfonse JONELLE Rex, RN Phone Number: 04/22/2024, 8:45 AM  Clinical Narrative:  Patient recently discharged on 04/12/24 with same complaints. Per documentation, seen at [facility] on  7/17- patient reported to Clinic CM- he receives some money for a local [employer] to assist with rent payments,  patient has no income, states his rent is now $1000/month,  he has a roommate now that will be  $420.00/month  in rent  starting in August and another roommate who will be joining them in August who will also be pay $420/month rent.   Notes states patient has already been evaluated by two ophthalmologist for evaluation of his blindness, had scheduled him for a 3rd opinion but he needs to pay a $200 co-pay in order to be seen by provider.  Patient has a PCP( [facility])  Patient supported by [facility] in [place], they assist in preparing meals for him, assist with ADL's, chores in his home, and assist with medications.  Patient will need assist with medications and transportation at discharge. TOC will continue to follow.        Patient Goals and CMS Choice            Expected Discharge Plan and Services                                              Prior Living Arrangements/Services                       Activities of Daily Living   ADL Screening (condition at time of admission) Independently performs ADLs?: Yes (appropriate for developmental age) Is the patient deaf or have difficulty hearing?: No Does the patient have difficulty seeing, even when wearing glasses/contacts?: Yes Does the patient have difficulty concentrating, remembering, or making decisions?: No  Permission Sought/Granted                  Emotional Assessment               Admission diagnosis:  Abscess of buttock, right [L02.31] Perineal abscess [L02.215] 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) 07/14/2023   NSTEMI (non-ST elevated myocardial infarction) (HCC) 06/17/2023   Hypertensive urgency 06/17/2023   Hypertensive emergency 06/17/2023   Hyperglycemic crisis due to diabetes mellitus (HCC) 06/17/2023   Headache 06/17/2023   Diabetic retinopathy (HCC) 06/17/2023   Blindness of right eye 06/17/2023   Insulin  dependent type 1 diabetes mellitus (HCC) 06/17/2023   AKI (acute kidney injury) (HCC) 06/17/2023   DKA (diabetic ketoacidosis) (HCC) 02/14/2014   Nausea with vomiting 02/14/2014   PCP:  [name], MD Pharmacy:   [facility] [address], [address] [place] [address] Phone: [phone] Fax: [phone]     Social Drivers of Health (SDOH) Social History: SDOH  Screenings   Food Insecurity: Food Insecurity Present (04/22/2024)  Housing: High Risk (04/22/2024)  Transportation Needs: Unmet Transportation Needs (04/22/2024)  Utilities: At Risk (04/22/2024)  Depression (PHQ2-9): Low Risk  (09/19/2023)  Financial Resource Strain: High Risk (01/28/2024)  Tobacco Use: Low Risk  (04/21/2024)  Health Literacy: Inadequate Health Literacy (03/24/2024)   SDOH Interventions:     Readmission Risk Interventions    04/08/2024    2:26 PM  Readmission Risk Prevention Plan  Transportation Screening Complete  PCP or Specialist Appt within 5-7 Days Complete  Home Care Screening Complete  Medication Review (RN CM) Complete

## 2024-04-22 NOTE — Progress Notes (Signed)
 PROGRESS NOTE    Drew [name]  FMW:[id number] DOB: [birth year] DOA: 04/21/2024 PCP: [name], MD   Brief Narrative:  Drew [name] is a 48 y.o. 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  Patient was recently admitted late June for evaluation by general surgery status post incision and drainage -tolerated well and discharged on antibiotics.  Assessment & Plan:   Principal Problem:   Perineal abscess    Left gluteal/perineal abscess:  -Status post incision and drainage of perirectal abscess with penrose drain placement, Dr. Vernetta 04/07/24 -tolerated well and discharged home on Augmentin  to complete 10-day course of antibiotics - Surgery consulted, appreciate insight recommendations, no indication now for procedure - Continue broad-spectrum antibiotics  Acute onset diarrhea - C. difficile negative -Continue to monitor -appears to be resolving   Type 2 diabetes mellitus, uncontrolled with hyperglycemia: -Continue resistant sliding scale and 25 units glargine twice daily   Hypertension: Well-controlled Carvedilol  and lisinopril . Hyperlipidemia: Continue with atorvastatin . Coronary disease: Asymptomatic at this time.  Continue aspirin  statins carvedilol  Clinical blindness secondary to diabetic retinopathy.   DVT prophylaxis: heparin  injection 5,000 Units Start: 04/22/24 0600 SCDs Start: 04/21/24 2237   Code Status:   Code Status: Full Code  Family Communication: None present  Status is: Inpatient  Dispo: The patient is from: Home              Anticipated d/c is to: Home              Anticipated d/c date is: 24 to 48 hours              Patient currently not medically stable for discharge  Consultants:  General Surgery  Procedures:  None  Antimicrobials:   Cefepime   Subjective: No acute issues or events overnight, diarrhea improving denies nausea vomiting fevers chills  Objective: Vitals:   04/22/24 0058 04/22/24 0125 04/22/24 0300 04/22/24 0517  BP: 134/75   127/79  Pulse: (!) 111  (!) 102 95  Resp: 16   16  Temp: 98.3 F (36.8 C)   98 F (36.7 C)  TempSrc: Oral     SpO2: 100%   97%  Weight:  64.3 kg    Height:  5' 5 (1.651 m)      Intake/Output Summary (Last 24 hours) at 04/22/2024 0730 Last data filed at 04/22/2024 0600 Gross per 24 hour  Intake 700 ml  Output --  Net 700 ml   Filed Weights   04/22/24 0125  Weight: 64.3 kg    Examination:  General:  Pleasantly resting in bed, No acute distress. HEENT:  Normocephalic atraumatic.  Sclerae nonicteric, noninjected.  Extraocular movements intact bilaterally. Neck:  Without mass or deformity.  Trachea is midline. Lungs:  Clear to auscultate bilaterally without rhonchi, wheeze, or rales. Heart:  Regular rate and rhythm.  Without murmurs, rubs, or gallops. Abdomen:  Soft, nontender, nondistended.  Without guarding or rebound.  Gluteal bandage clean dry intact  Data Reviewed: I have personally reviewed following labs and imaging studies  CBC: Recent Labs  Lab 04/21/24 1357 04/22/24 0336  WBC 9.7 10.1  NEUTROABS 6.2  --   HGB 11.6* 11.7*  HCT 34.2* 33.3*  MCV 84.0 83.9  PLT 383 352   Basic Metabolic Panel: Recent Labs  Lab 04/21/24 1357 04/22/24 0336  NA 135 135  K 3.5 3.3*  CL 104  108  CO2 25 22  GLUCOSE 180* 294*  BUN 18 14  CREATININE 0.59* 0.60*  CALCIUM  8.7* 8.4*   GFR: Estimated Creatinine Clearance: 98.2 mL/min (A) (by C-G formula based on SCr of 0.6 mg/dL (L)).  Liver Function Tests: Recent Labs  Lab 04/21/24 1357  AST 31  ALT 54*  ALKPHOS 191*  BILITOT 0.8  PROT 7.3  ALBUMIN 2.8*   CBG: Recent Labs  Lab 04/21/24 1906 04/22/24 0058  GLUCAP 75 356*    Recent Results (from the past 240 hours)  C Difficile Quick Screen w PCR  reflex     Status: None   Collection Time: 04/21/24 11:10 PM   Specimen: STOOL  Result Value Ref Range Status   C Diff antigen NEGATIVE NEGATIVE Final   C Diff toxin NEGATIVE NEGATIVE Final   C Diff interpretation No C. difficile detected.  Final    Comment: Performed at [facility], [address] [place], [place] [address]         Radiology Studies: CT PELVIS W CONTRAST Result Date: 04/21/2024 CLINICAL DATA:  Soft tissue infection suspected, pelvis, xray done EXAM: CT PELVIS WITH CONTRAST TECHNIQUE: Multidetector CT imaging of the pelvis was performed using the standard protocol following the bolus administration of intravenous contrast. RADIATION DOSE REDUCTION: This exam was performed according to the departmental dose-optimization program which includes automated exposure control, adjustment of the mA and/or kV according to patient size and/or use of iterative reconstruction technique. CONTRAST:  OMNIPAQUE  IOHEXOL  300 MG/ML  SOLN COMPARISON:  CT abdomen pelvis 04/07/2024 coma CT abdomen pelvis 08/21/2023 FINDINGS: Urinary Tract: Persistent mild anterior bladder wall thickening (12:86; 4, 34). The urinary bladder lumen is distended with fluid. Otherwise no abnormality visualized. Bowel:  Unremarkable visualized pelvic bowel loops. Vascular/Lymphatic: Severe atherosclerotic plaque. No pathologically enlarged lymph nodes. No significant vascular abnormality seen. Reproductive:  No mass or other significant abnormality Other: No intraperitoneal free fluid. No intraperitoneal free gas. No organized fluid collection. Musculoskeletal: There is a 5 x 1.3 cm left anterior perianal/perineal elongated fluid density lesion with peripheral enhancing walls (4:78). Finding extends to the dermis (14:91). Continuous lateral inferior pocket measuring up to 0.4 cm (14:102). Associated subcutaneus soft tissue edema of the perineal fat. No evidence of fracture, dislocation, or joint  effusion. No evidence of severe arthropathy. No aggressive appearing focal bone abnormality. IMPRESSION: 1. A 5 x 1.3 cm left anterior perianal/perineal elongated possibly draining abscess/fistula. 2. Persistent mild anterior bladder wall thickening. Correlate with urinalysis. Consider outpatient urology. Electronically Signed   By: Morgane  Naveau M.D.   On: 04/21/2024 21:39    Scheduled Meds:  acidophilus  1 capsule Oral Daily   amLODipine   5 mg Oral Daily   atorvastatin   20 mg Oral Daily   carvedilol   3.125 mg Oral BID WC   gabapentin   300 mg Oral QHS   heparin   5,000 Units Subcutaneous Q8H   insulin  glargine-yfgn  25 Units Subcutaneous BID   multivitamin with minerals  1 tablet Oral Daily   Continuous Infusions:  ceFEPime  (MAXIPIME ) IV 2 g (04/22/24 0122)     LOS: 1 day   Time spent:  Elsie JAYSON [place], DO [facility]  If 7PM-7AM, please contact night-coverage [email or web address]  04/22/2024, 7:30 AM

## 2024-04-22 NOTE — Progress Notes (Signed)
 Progress Note  This encounter was conducted using a [place] interpreter.  Subjective: Patient reports that initially his pain improved after I&D with penrose placement for left gluteal abscess from 04/07/2024. In the last week, he has had increased pain of the area. He reports some discharge. Patient reported subjective fevers.   ROS  All negative with the exception of above.   Objective: Vital signs in last 24 hours: Temp:  [97.9 F (36.6 C)-99 F (37.2 C)] 98 F (36.7 C) (07/24 0517) Pulse Rate:  [88-114] 95 (07/24 0517) Resp:  [15-17] 16 (07/24 0517) BP: (106-152)/(75-95) 127/79 (07/24 0517) SpO2:  [97 %-100 %] 97 % (07/24 0517) Weight:  [64.3 kg] 64.3 kg (07/24 0125) Last BM Date : 04/22/24  Intake/Output from previous day: 07/23 0701 - 07/24 0700 In: 700 [P.O.:600; IV Piggyback:100] Out: -  Intake/Output this shift: No intake/output data recorded.  PE: General: Pleasant, WD, male who is laying in bed in NAD. Respiratory effort nonlabored GU: Left gluteal incision present. Incision is open. No purulent drainage and no active bleeding. No induration or overwhelming erythema.  Psych: A&Ox3 with an appropriate affect.    Lab Results:  Recent Labs    04/21/24 1357 04/22/24 0336  WBC 9.7 10.1  HGB 11.6* 11.7*  HCT 34.2* 33.3*  PLT 383 352   BMET Recent Labs    04/21/24 1357 04/22/24 0336  NA 135 135  K 3.5 3.3*  CL 104 108  CO2 25 22  GLUCOSE 180* 294*  BUN 18 14  CREATININE 0.59* 0.60*  CALCIUM  8.7* 8.4*   PT/INR No results for input(s): LABPROT, INR in the last 72 hours. CMP     Component Value Date/Time   NA 135 04/22/2024 0336   NA 139 07/14/2023 1609   K 3.3 (L) 04/22/2024 0336   CL 108 04/22/2024 0336   CO2 22 04/22/2024 0336   GLUCOSE 294 (H) 04/22/2024 0336   BUN 14 04/22/2024 0336   BUN 20 07/14/2023 1609   CREATININE 0.60 (L) 04/22/2024 0336   CALCIUM  8.4 (L) 04/22/2024 0336   PROT 7.3 04/21/2024 1357   ALBUMIN 2.8 (L)  04/21/2024 1357   AST 31 04/21/2024 1357   ALT 54 (H) 04/21/2024 1357   ALKPHOS 191 (H) 04/21/2024 1357   BILITOT 0.8 04/21/2024 1357   GFRNONAA >60 04/22/2024 0336   GFRAA >60 07/31/2018 1904   Lipase     Component Value Date/Time   LIPASE 45 08/20/2023 2103       Studies/Results: CT PELVIS W CONTRAST Result Date: 04/21/2024 CLINICAL DATA:  Soft tissue infection suspected, pelvis, xray done EXAM: CT PELVIS WITH CONTRAST TECHNIQUE: Multidetector CT imaging of the pelvis was performed using the standard protocol following the bolus administration of intravenous contrast. RADIATION DOSE REDUCTION: This exam was performed according to the departmental dose-optimization program which includes automated exposure control, adjustment of the mA and/or kV according to patient size and/or use of iterative reconstruction technique. CONTRAST:  OMNIPAQUE  IOHEXOL  300 MG/ML  SOLN COMPARISON:  CT abdomen pelvis 04/07/2024 coma CT abdomen pelvis 08/21/2023 FINDINGS: Urinary Tract: Persistent mild anterior bladder wall thickening (12:86; 4, 34). The urinary bladder lumen is distended with fluid. Otherwise no abnormality visualized. Bowel:  Unremarkable visualized pelvic bowel loops. Vascular/Lymphatic: Severe atherosclerotic plaque. No pathologically enlarged lymph nodes. No significant vascular abnormality seen. Reproductive:  No mass or other significant abnormality Other: No intraperitoneal free fluid. No intraperitoneal free gas. No organized fluid collection. Musculoskeletal: There is a 5 x 1.3  cm left anterior perianal/perineal elongated fluid density lesion with peripheral enhancing walls (4:78). Finding extends to the dermis (14:91). Continuous lateral inferior pocket measuring up to 0.4 cm (14:102). Associated subcutaneus soft tissue edema of the perineal fat. No evidence of fracture, dislocation, or joint effusion. No evidence of severe arthropathy. No aggressive appearing focal bone abnormality.  IMPRESSION: 1. A 5 x 1.3 cm left anterior perianal/perineal elongated possibly draining abscess/fistula. 2. Persistent mild anterior bladder wall thickening. Correlate with urinalysis. Consider outpatient urology. Electronically Signed   By: Morgane  Naveau M.D.   On: 04/21/2024 21:39    Anti-infectives: Anti-infectives (From admission, onward)    Start     Dose/Rate Route Frequency Ordered Stop   04/22/24 0000  ceFEPIme  (MAXIPIME ) 2 g in sodium chloride  0.9 % 100 mL IVPB        2 g 200 mL/hr over 30 Minutes Intravenous Every 8 hours 04/21/24 2320     04/21/24 1900  vancomycin  (VANCOREADY) IVPB 1500 mg/300 mL        1,500 mg 150 mL/hr over 120 Minutes Intravenous  Once 04/21/24 1849 04/21/24 2233   04/21/24 1845  Ampicillin -Sulbactam (UNASYN ) 3 g in sodium chloride  0.9 % 100 mL IVPB        3 g 200 mL/hr over 30 Minutes Intravenous  Once 04/21/24 1839 04/21/24 1928        Assessment/Plan -History of left gluteal abscess s/p I&D 04/07/2024. Cx with gram stain: abundant WBC present predominantly PMN rare gram positive cocci in pairs, abundant Escherichia coli within mixed organs, no anaerobes isolated. Treated with IV antibiotics and then transitioned to PO Augmentin  -Return with Left gluteal pain -CT Pelvis W Contrast from 04/21/2024 showed 5 x 1.3 cm left anterior perianal/perineal elongated possibly draining abscess/fistula. -WBC 10.1 -Exam completed. No surgical intervention recommended at this time. -Start IV antibiotics -Recommend sitz baths TID  FEN: Modified Carb diet VTE: Heparin  injection ID: [id number]  and cefepime     LOS: 1 day   I reviewed hospitalist notes, ED notes, nursing notes, last 24 h vitals and pain scores, last 48 h intake and output, last 24 h labs and trends, and last 24 h imaging results.  This care required moderate level of medical decision making.    Marjorie Carlyon Favre, [facility] 04/22/2024, 10:18 AM Please see Amion for pager number  during day hours 7:00am-4:30pm

## 2024-04-22 NOTE — Inpatient Diabetes Management (Signed)
 Inpatient Diabetes Program Recommendations  AACE/ADA: New Consensus Statement on Inpatient Glycemic Control (2015)  Target Ranges:  Prepandial:   less than 140 mg/dL      Peak postprandial:   less than 180 mg/dL (1-2 hours)      Critically ill patients:  140 - 180 mg/dL   Lab Results  Component Value Date   GLUCAP 133 (H) 04/22/2024   HGBA1C >15.0 01/27/2024    Review of Glycemic Control  Diabetes history: DM2 Outpatient Diabetes medications: Basaglar  25 BID Current orders for Inpatient glycemic control: Semglee  25 units BID, Novolog  0-20 TID with meals and 0-5 HS  HgbA1C - > 15% Blindness d/t diabetic retinopathy I&D on 04/07/2024 CBGs 133, 171, 197 Eating 100%  Inpatient Diabetes Program Recommendations:    [employer] Blood glucose meter for blood sugar monitoring. Contact TOC.  If FBS > 180 mg/dL, titrate Semglee    If post-prandials > 180 mg/dL, add small amount of Novolog  meal coverage  Will continue to follow glucose trends.  Thank you. Shona Brandy, RD, LDN, CDCES Inpatient Diabetes Coordinator [phone]

## 2024-04-23 ENCOUNTER — Other Ambulatory Visit: Payer: Self-pay

## 2024-04-23 ENCOUNTER — Other Ambulatory Visit (HOSPITAL_COMMUNITY): Payer: Self-pay

## 2024-04-23 LAB — CBC
HCT: 32.9 % — ABNORMAL LOW (ref 39.0–52.0)
Hemoglobin: 10.9 g/dL — ABNORMAL LOW (ref 13.0–17.0)
MCH: 28.4 pg (ref 26.0–34.0)
MCHC: 33.1 g/dL (ref 30.0–36.0)
MCV: 85.7 fL (ref 80.0–100.0)
Platelets: 324 K/uL (ref 150–400)
RBC: 3.84 MIL/uL — ABNORMAL LOW (ref 4.22–5.81)
RDW: 12.9 % (ref 11.5–15.5)
WBC: 8.1 K/uL (ref 4.0–10.5)
nRBC: 0 % (ref 0.0–0.2)

## 2024-04-23 LAB — BASIC METABOLIC PANEL WITH GFR
Anion gap: 8 (ref 5–15)
BUN: 13 mg/dL (ref 6–20)
CO2: 24 mmol/L (ref 22–32)
Calcium: 8.5 mg/dL — ABNORMAL LOW (ref 8.9–10.3)
Chloride: 107 mmol/L (ref 98–111)
Creatinine, Ser: 0.46 mg/dL — ABNORMAL LOW (ref 0.61–1.24)
GFR, Estimated: >60 mL/min (ref >60)
Glucose, Bld: 240 mg/dL — ABNORMAL HIGH (ref 70–99)
Potassium: 3.7 mmol/L (ref 3.5–5.1)
Sodium: 139 mmol/L (ref 135–145)

## 2024-04-23 LAB — GLUCOSE, CAPILLARY
Glucose-Capillary: 101 mg/dL — ABNORMAL HIGH (ref 70–99)
Glucose-Capillary: 62 mg/dL — ABNORMAL LOW (ref 70–99)
Glucose-Capillary: 195 mg/dL — ABNORMAL HIGH (ref 70–99)
Glucose-Capillary: 287 mg/dL — ABNORMAL HIGH (ref 70–99)

## 2024-04-23 MED ORDER — CIPROFLOXACIN HCL 500 MG PO TABS
500.0000 mg | ORAL_TABLET | Freq: Two times a day (BID) | ORAL | 0 refills | Status: AC
  Filled 2024-04-23 (×2): qty 24, 12d supply, fill #0

## 2024-04-23 MED ORDER — RISAQUAD PO CAPS: 1.0000 | ORAL_CAPSULE | Freq: Every day | ORAL | 0 refills | Status: AC

## 2024-04-23 NOTE — Plan of Care (Signed)

## 2024-04-23 NOTE — Progress Notes (Signed)
 Discharge medication delivered to patient at bedside [facility]

## 2024-04-23 NOTE — Plan of Care (Signed)
  Problem: Education: Goal: Ability to describe self-care measures that may prevent or decrease complications (Diabetes Survival Skills Education) will improve Outcome: Progressing   Problem: Metabolic: Goal: Ability to maintain appropriate glucose levels will improve Outcome: Progressing   Problem: Nutritional: Goal: Maintenance of adequate nutrition will improve Outcome: Progressing   Problem: Skin Integrity: Goal: Risk for impaired skin integrity will decrease Outcome: Progressing   Problem: Tissue Perfusion: Goal: Adequacy of tissue perfusion will improve Outcome: Progressing   

## 2024-04-23 NOTE — TOC Transition Note (Addendum)
 Transition of Care [facility]) - Discharge Note   Patient Details  Name: Drew [name] MRN: [id number] Date of Birth: Apr 09, 1976  [facility] CM/SW Contact:  Alfonse JONELLE Rex, RN Phone Number: 04/23/2024, 2:29 PM   Clinical Narrative:  DC to home order. Met with patient at bedside using Spanish Video Interpreter, patient states he uses [employer] taxi service for his clinic appointments and the taxi driver will usually assist him to his apartment door. NCM request bedside nurse when calling taxi to request a taxi driver that is fama liar with patient and willing to assist him to his apartment door.  NCM will contact patient's community clinic Case Manager, Slater Diesel, to updated on recent hospital admission and pharmacy order for [employer] meter . No further TOC needs identified at this time.    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      Final next level of care: Home/Self Care Barriers to Discharge: Barriers Resolved   Patient Goals and CMS Choice Patient states their goals for this hospitalization and ongoing recovery are:: return home          Discharge Placement                       Discharge Plan and Services Additional resources added to the After Visit Summary for                                       Social Drivers of Health (SDOH) Interventions SDOH Screenings   Food Insecurity: Food Insecurity Present (04/22/2024)  Housing: High Risk (04/22/2024)  Transportation Needs: Unmet Transportation Needs (04/22/2024)  Utilities: At Risk (04/22/2024)  Depression (PHQ2-9): Low Risk  (09/19/2023)  Financial Resource Strain: High Risk (01/28/2024)  Tobacco Use: Low Risk  (04/21/2024)  Health Literacy: Inadequate Health Literacy (03/24/2024)     Readmission Risk  Interventions    04/23/2024    2:28 PM 04/08/2024    2:26 PM  Readmission Risk Prevention Plan  Post Dischage Appt Complete   Medication Screening Complete   Transportation Screening Complete Complete  PCP or Specialist Appt within 5-7 Days  Complete  Home Care Screening  Complete  Medication Review (RN CM)  Complete

## 2024-04-23 NOTE — Discharge Summary (Signed)
 Physician Discharge Summary  Drew [name] FMW:[id number] DOB: 09-Nov-1975 DOA: 04/21/2024  PCP: [name], MD  Admit date: 04/21/2024 Discharge date: 04/23/2024  Admitted From: Home Disposition: Home  Recommendations for Outpatient Follow-up:  Follow up with PCP in 1-2 weeks Follow-up with general surgery and wound care as scheduled  Discharge Condition: Stable CODE STATUS: Full Diet recommendation: As tolerated low-salt low-fat diabetic diet  Brief/Interim Summary: Drew [name] is a 48 y.o. 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  Patient was recently admitted late June for evaluation by general surgery status post incision and drainage -tolerated well and discharged on antibiotics.  Patient admitted for reported worsening drainage from previously treated abscess.  No current indication for further imaging or procedure, will discharge patient home on remainder of 14-day course of antibiotics with Cipro .  Close outpatient follow-up with PCP surgery and wound care as discussed.  Otherwise patient's previous episode of diarrhea appears to have resolved, glucose appears well-controlled and other comorbid conditions are stable, no medication changes as below other than additional antibiotics.  Discharge Diagnoses:  Principal Problem:   Perineal abscess  Left gluteal/perineal abscess:  -Status post incision and drainage of perirectal abscess with penrose drain placement, Dr. Vernetta 04/07/24 -tolerated well and discharged home on Augmentin  to complete 10-day course of antibiotics - Surgery consulted, appreciate insight recommendations, no indication now for procedure - Continue antibiotics at discharge to complete 14-day course, Cipro    Acute onset diarrhea - C. difficile negative -Continue to  monitor -appears to be resolving   Type 2 diabetes mellitus, uncontrolled with hyperglycemia: -Continue resistant sliding scale and 25 units glargine twice daily    Hypertension: Well-controlled Carvedilol  and lisinopril . Hyperlipidemia: Continue with atorvastatin . Coronary disease: Asymptomatic at this time.  Continue aspirin  statins carvedilol  Clinical blindness secondary to diabetic retinopathy.  Discharge Instructions  Discharge Instructions     Call MD for:  difficulty breathing, headache or visual disturbances   Complete by: As directed    Call MD for:  extreme fatigue   Complete by: As directed    Call MD for:  hives   Complete by: As directed    Call MD for:  persistant dizziness or light-headedness   Complete by: As directed    Call MD for:  persistant nausea and vomiting   Complete by: As directed    Call MD for:  redness, tenderness, or signs of infection (pain, swelling, redness, odor or green/yellow discharge around incision site)   Complete by: As directed    Call MD for:  severe uncontrolled pain   Complete by: As directed    Call MD for:  temperature >100.4   Complete by: As directed    Diet - low sodium heart healthy   Complete by: As directed    Discharge wound care:   Complete by: As directed    Cover with waterproof bandage if showering. Leave open to air otherwise. Do no submerge.   Increase activity slowly   Complete by: As directed       Allergies as of 04/23/2024   No Known Allergies      Medication List     STOP taking these medications    lisinopril  2.5 MG tablet Commonly known as: Zestril        TAKE these medications    acidophilus Caps capsule Take 1 capsule by mouth daily for 10  days.   amLODipine  5 MG tablet Commonly known as: NORVASC  Tome 1 tableta (5 mg en total) por va oral diariamente. (Take 1 tablet (5 mg total) by mouth daily.)   atorvastatin  20 MG tablet Commonly known as: LIPITOR Tome 1 tableta (20 mg en total)  por va oral diariamente. (Take 1 tablet (20 mg total) by mouth daily.)   Basaglar  KwikPen 100 UNIT/ML Inject 25 Units into the skin 2 (two) times daily.   carvedilol  3.125 MG tablet Commonly known as: COREG  Take 1 tablet (3.125 mg total) by mouth 2 (two) times daily with a meal.   ciprofloxacin  500 MG tablet Commonly known as: Cipro  Take 1 tablet (500 mg total) by mouth 2 (two) times daily for 12 days.   gabapentin  300 MG capsule Commonly known as: NEURONTIN  Tome 1 cpsula (300 mg en total) por va oral antes de acostarse. (Take 1 capsule (300 mg total) by mouth at bedtime.)   ibuprofen  600 MG tablet Commonly known as: ADVIL  Take 1 tablet (600 mg total) by mouth every 8 (eight) hours as needed. What changed: reasons to take this   Insulin  Syringe-Needle U-100 31G X 5/16 0.5 ML Misc Commonly known as: TRUEplus Insulin  Syringe Use to inject 70/30 insulin  twice daily.   Insupen Pen Needles 32G X 4 MM Misc Generic drug: Insulin  Pen Needle Use to inject Basaglar  once daily.   loperamide  2 MG tablet Commonly known as: Imodium  A-D Take 1 tablet (2 mg total) by mouth 3 (three) times daily as needed for diarrhea or loose stools.   miconazole  2 % cream Commonly known as: Micatin Apply 1 Application topically 2 (two) times daily.   nystatin  cream Commonly known as: MYCOSTATIN  Apply topically 2 (two) times daily. Apply to penile area, apply after retracting foreskin   oxyCODONE  5 MG immediate release tablet Commonly known as: Oxy IR/ROXICODONE  Take 1 tablet (5 mg total) by mouth every 6 (six) hours as needed for moderate pain (pain score 4-6) or severe pain (pain score 7-10).               Discharge Care Instructions  (From admission, onward)           Start     Ordered   04/23/24 0000  Discharge wound care:       Comments: Cover with waterproof bandage if showering. Leave open to air otherwise. Do no submerge.   04/23/24 1241            No Known  Allergies  Consultations: General Surgery  Procedures/Studies: CT PELVIS W CONTRAST Result Date: 04/21/2024 CLINICAL DATA:  Soft tissue infection suspected, pelvis, xray done EXAM: CT PELVIS WITH CONTRAST TECHNIQUE: Multidetector CT imaging of the pelvis was performed using the standard protocol following the bolus administration of intravenous contrast. RADIATION DOSE REDUCTION: This exam was performed according to the departmental dose-optimization program which includes automated exposure control, adjustment of the mA and/or kV according to patient size and/or use of iterative reconstruction technique. CONTRAST:  OMNIPAQUE  IOHEXOL  300 MG/ML  SOLN COMPARISON:  CT abdomen pelvis 04/07/2024 coma CT abdomen pelvis 08/21/2023 FINDINGS: Urinary Tract: Persistent mild anterior bladder wall thickening (12:86; 4, 34). The urinary bladder lumen is distended with fluid. Otherwise no abnormality visualized. Bowel:  Unremarkable visualized pelvic bowel loops. Vascular/Lymphatic: Severe atherosclerotic plaque. No pathologically enlarged lymph nodes. No significant vascular abnormality seen. Reproductive:  No mass or other significant abnormality Other: No intraperitoneal free fluid. No intraperitoneal free gas. No organized fluid collection. Musculoskeletal: There  is a 5 x 1.3 cm left anterior perianal/perineal elongated fluid density lesion with peripheral enhancing walls (4:78). Finding extends to the dermis (14:91). Continuous lateral inferior pocket measuring up to 0.4 cm (14:102). Associated subcutaneus soft tissue edema of the perineal fat. No evidence of fracture, dislocation, or joint effusion. No evidence of severe arthropathy. No aggressive appearing focal bone abnormality. IMPRESSION: 1. A 5 x 1.3 cm left anterior perianal/perineal elongated possibly draining abscess/fistula. 2. Persistent mild anterior bladder wall thickening. Correlate with urinalysis. Consider outpatient urology. Electronically  Signed   By: Morgane  Naveau M.D.   On: 04/21/2024 21:39   CT ABDOMEN PELVIS W CONTRAST Result Date: 04/07/2024 EXAM: CT ABDOMEN AND PELVIS WITH CONTRAST 04/07/2024 06:34:07 AM TECHNIQUE: CT of the abdomen and pelvis was performed with the administration of intravenous contrast. Multiplanar reformatted images are provided for review. Automated exposure control, iterative reconstruction, and/or weight based adjustment of the mA/kV was utilized to reduce the radiation dose to as low as reasonably achievable. COMPARISON: CT of the abdomen and pelvis with contrast 08/21/2023. CLINICAL HISTORY: Abdominal pain, acute, nonlocalized; Perineal abscess. Abscess on the buttocks started 2 weeks ago. Urinary retention started yesterday, has only urinated once yesterday morning, vomiting started last week and is persistent. Was given an antibiotic for it yesterday morning and it has been making him sick, WBC's 22.9, GFR>60. FINDINGS: LOWER CHEST: Mild dependent atelectasis is present at the lung bases bilaterally. LIVER: Diffuse fatty infiltration of the liver is present. No focal lesions are present. GALLBLADDER AND BILE DUCTS: Gallbladder is unremarkable. No biliary ductal dilatation. SPLEEN: No acute abnormality. PANCREAS: No acute abnormality. ADRENAL GLANDS: No acute abnormality. KIDNEYS, URETERS AND BLADDER: No stones in the kidneys or ureters. No hydronephrosis. No perinephric or periureteral stranding. A Foley catheter is present within the urinary bladder. GI AND BOWEL: Stomach demonstrates no acute abnormality. There is no bowel obstruction. No bowel wall thickening. The distal sigmoid colon and rectum are otherwise unremarkable. PERITONEUM AND RETROPERITONEUM: No ascites. No free air. Diffuse left perirectal inflammatory changes are present along the left gluteal fold measuring 7.9 x 6.1 x 7.5 cm. No discrete fluid collection is present. No fistulous tract is evident. VASCULATURE: Atherosclerotic changes are present  in the aorta and branch vessels without aneurysm. LYMPH NODES: No lymphadenopathy. REPRODUCTIVE ORGANS: No acute abnormality. BONES AND SOFT TISSUES: No acute osseous abnormality. No focal soft tissue abnormality. IMPRESSION: 1. Diffuse left perirectal inflammatory changes along the left gluteal fold measuring 7.9 x 6.1 x 7.5 cm, without discrete fluid collection or fistulous tract. This likely reflects residual or recurrent infection. Electronically signed by: Lonni Necessary MD 04/07/2024 06:43 AM EDT RP Workstation: [id number]   DG Chest Port 1 View Result Date: 04/07/2024 EXAM: 1 VIEW XRAY OF THE CHEST 04/07/2024 04:45:00 AM COMPARISON: 2 view chest x-ray 06/16/2023. CLINICAL HISTORY: Questionable sepsis - evaluate for abnormality. ? Sepsis, abscess to buttocks, fever, vomiting, urinary retention. FINDINGS: LUNGS AND PLEURA: Lung volumes are low. No focal pulmonary opacity. No pulmonary edema. No pleural effusion. No pneumothorax. HEART AND MEDIASTINUM: No acute abnormality of the cardiac and mediastinal silhouettes. BONES AND SOFT TISSUES: No acute osseous abnormality. IMPRESSION: 1. No acute process. 2. Low lung volumes. Electronically signed by: Lonni Necessary MD 04/07/2024 05:01 AM EDT RP Workstation: [id number]     Subjective: No acute issues or events overnight   Discharge Exam: Vitals:   04/22/24 2202 04/23/24 0611  BP: 132/80 130/77  Pulse: 94 87  Resp: 18 18  Temp: 98.5 F (36.9  C) 97.9 F (36.6 C)  SpO2: 98% 98%   Vitals:   04/22/24 0914 04/22/24 1144 04/22/24 2202 04/23/24 0611  BP: (!) 162/77 (!) 109/59 132/80 130/77  Pulse: 86 77 94 87  Resp:   18 18  Temp:   98.5 F (36.9 C) 97.9 F (36.6 C)  TempSrc:   Oral Oral  SpO2:   98% 98%  Weight:      Height:        General: Pt is alert, awake, not in acute distress Cardiovascular: RRR, S1/S2 +, no rubs, no gallops Respiratory: CTA bilaterally, no wheezing, no rhonchi Abdominal: Soft, NT, ND, bowel sounds  + Extremities: no edema, no cyanosis Skin: Gluteal bandage clean/dry/intact  The results of significant diagnostics from this hospitalization (including imaging, microbiology, ancillary and laboratory) are listed below for reference.     Microbiology: Recent Results (from the past 240 hours)  C Difficile Quick Screen w PCR reflex     Status: None   Collection Time: 04/21/24 11:10 PM   Specimen: STOOL  Result Value Ref Range Status   C Diff antigen NEGATIVE NEGATIVE Final   C Diff toxin NEGATIVE NEGATIVE Final   C Diff interpretation No C. difficile detected.  Final    Comment: Performed at [facility], [address], [place], [place] [address]     Labs: BNP (last 3 results) No results for input(s): BNP in the last 8760 hours. Basic Metabolic Panel: Recent Labs  Lab 04/21/24 1357 04/22/24 0336 04/23/24 0937  NA 135 135 139  K 3.5 3.3* 3.7  CL 104 108 107  CO2 25 22 24   GLUCOSE 180* 294* 240*  BUN 18 14 13   CREATININE 0.59* 0.60* 0.46*  CALCIUM  8.7* 8.4* 8.5*   Liver Function Tests: Recent Labs  Lab 04/21/24 1357  AST 31  ALT 54*  ALKPHOS 191*  BILITOT 0.8  PROT 7.3  ALBUMIN 2.8*   CBC: Recent Labs  Lab 04/21/24 1357 04/22/24 0336 04/23/24 0937  WBC 9.7 10.1 8.1  NEUTROABS 6.2  --   --   HGB 11.6* 11.7* 10.9*  HCT 34.2* 33.3* 32.9*  MCV 84.0 83.9 85.7  PLT 383 352 324   CBG: Recent Labs  Lab 04/22/24 2159 04/23/24 0550 04/23/24 0615 04/23/24 0748 04/23/24 1146  GLUCAP 295* 62* 101* 195* 287*   Urinalysis    Component Value Date/Time   COLORURINE YELLOW 04/07/2024 0520   APPEARANCEUR HAZY (A) 04/07/2024 0520   LABSPEC 1.026 04/07/2024 0520   PHURINE 5.0 04/07/2024 0520   GLUCOSEU >=500 (A) 04/07/2024 0520   HGBUR SMALL (A) 04/07/2024 0520   BILIRUBINUR negative 04/15/2024 1813   KETONESUR negative 04/15/2024 1813   KETONESUR 5 (A) 04/07/2024 0520   PROTEINUR >=300 (A) 04/07/2024 0520   UROBILINOGEN 0.2 04/15/2024  1813   UROBILINOGEN 1.0 02/14/2014 1615   NITRITE Negative 04/15/2024 1813   NITRITE NEGATIVE 04/07/2024 0520   LEUKOCYTESUR Negative 04/15/2024 1813   LEUKOCYTESUR NEGATIVE 04/07/2024 0520   Sepsis Labs Recent Labs  Lab 04/21/24 1357 04/22/24 0336 04/23/24 0937  WBC 9.7 10.1 8.1   Microbiology Recent Results (from the past 240 hours)  C Difficile Quick Screen w PCR reflex     Status: None   Collection Time: 04/21/24 11:10 PM   Specimen: STOOL  Result Value Ref Range Status   C Diff antigen NEGATIVE NEGATIVE Final   C Diff toxin NEGATIVE NEGATIVE Final   C Diff interpretation No C. difficile detected.  Final  Comment: Performed at [facility], [address] [place], [place] [address]     Time coordinating discharge: Over 30 minutes  SIGNED:   Elsie JAYSON [place], DO [facility] 04/23/2024, 12:44 PM Pager   If 7PM-7AM, please contact night-coverage [email or web address]

## 2024-04-23 NOTE — Progress Notes (Addendum)
 Progress Note  This encounter was conducted using Spanish interpretor.     Subjective: Patient reports that his pain is well controlled around I&D site. Patient feels that there has been a decrease in drainage from the site.   ROS   Objective: Vital signs in last 24 hours: Temp:  [97.9 F (36.6 C)-98.5 F (36.9 C)] 97.9 F (36.6 C) (07/25 0611) Pulse Rate:  [77-94] 87 (07/25 0611) Resp:  [18] 18 (07/25 0611) BP: (109-132)/(59-80) 130/77 (07/25 0611) SpO2:  [98 %] 98 % (07/25 0611) Last BM Date : 04/22/24  Intake/Output from previous day: 07/24 0701 - 07/25 0700 In: 360 [P.O.:360] Out: -  Intake/Output this shift: No intake/output data recorded.  PE: General: Pleasant, WD, male who is laying in bed in NAD. Lungs: Respiratory effort nonlabored GU: Left gluteal incision present that is about 3 cm. Minimal drainage expelled. No active bleeding, induration, or overwhelming erythema Psych: A&Ox3 with an appropriate affect.    Lab Results:  Recent Labs    04/21/24 1357 04/22/24 0336  WBC 9.7 10.1  HGB 11.6* 11.7*  HCT 34.2* 33.3*  PLT 383 352   BMET Recent Labs    04/21/24 1357 04/22/24 0336  NA 135 135  K 3.5 3.3*  CL 104 108  CO2 25 22  GLUCOSE 180* 294*  BUN 18 14  CREATININE 0.59* 0.60*  CALCIUM  8.7* 8.4*   PT/INR No results for input(s): LABPROT, INR in the last 72 hours. CMP     Component Value Date/Time   NA 135 04/22/2024 0336   NA 139 07/14/2023 1609   K 3.3 (L) 04/22/2024 0336   CL 108 04/22/2024 0336   CO2 22 04/22/2024 0336   GLUCOSE 294 (H) 04/22/2024 0336   BUN 14 04/22/2024 0336   BUN 20 07/14/2023 1609   CREATININE 0.60 (L) 04/22/2024 0336   CALCIUM  8.4 (L) 04/22/2024 0336   PROT 7.3 04/21/2024 1357   ALBUMIN 2.8 (L) 04/21/2024 1357   AST 31 04/21/2024 1357   ALT 54 (H) 04/21/2024 1357   ALKPHOS 191 (H) 04/21/2024 1357   BILITOT 0.8 04/21/2024 1357   GFRNONAA >60 04/22/2024 0336   GFRAA >60 07/31/2018 1904   Lipase      Component Value Date/Time   LIPASE 45 08/20/2023 2103       Studies/Results: CT PELVIS W CONTRAST Result Date: 04/21/2024 CLINICAL DATA:  Soft tissue infection suspected, pelvis, xray done EXAM: CT PELVIS WITH CONTRAST TECHNIQUE: Multidetector CT imaging of the pelvis was performed using the standard protocol following the bolus administration of intravenous contrast. RADIATION DOSE REDUCTION: This exam was performed according to the departmental dose-optimization program which includes automated exposure control, adjustment of the mA and/or kV according to patient size and/or use of iterative reconstruction technique. CONTRAST:  OMNIPAQUE  IOHEXOL  300 MG/ML  SOLN COMPARISON:  CT abdomen pelvis 04/07/2024 coma CT abdomen pelvis 08/21/2023 FINDINGS: Urinary Tract: Persistent mild anterior bladder wall thickening (12:86; 4, 34). The urinary bladder lumen is distended with fluid. Otherwise no abnormality visualized. Bowel:  Unremarkable visualized pelvic bowel loops. Vascular/Lymphatic: Severe atherosclerotic plaque. No pathologically enlarged lymph nodes. No significant vascular abnormality seen. Reproductive:  No mass or other significant abnormality Other: No intraperitoneal free fluid. No intraperitoneal free gas. No organized fluid collection. Musculoskeletal: There is a 5 x 1.3 cm left anterior perianal/perineal elongated fluid density lesion with peripheral enhancing walls (4:78). Finding extends to the dermis (14:91). Continuous lateral inferior pocket measuring up to 0.4 cm (14:102). Associated subcutaneus  soft tissue edema of the perineal fat. No evidence of fracture, dislocation, or joint effusion. No evidence of severe arthropathy. No aggressive appearing focal bone abnormality. IMPRESSION: 1. A 5 x 1.3 cm left anterior perianal/perineal elongated possibly draining abscess/fistula. 2. Persistent mild anterior bladder wall thickening. Correlate with urinalysis. Consider outpatient  urology. Electronically Signed   By: Morgane  Naveau M.D.   On: 04/21/2024 21:39    Anti-infectives: Anti-infectives (From admission, onward)    Start     Dose/Rate Route Frequency Ordered Stop   04/22/24 0000  ceFEPIme  (MAXIPIME ) 2 g in sodium chloride  0.9 % 100 mL IVPB        2 g 200 mL/hr over 30 Minutes Intravenous Every 8 hours 04/21/24 2320     04/21/24 1900  vancomycin  (VANCOREADY) IVPB 1500 mg/300 mL        1,500 mg 150 mL/hr over 120 Minutes Intravenous  Once 04/21/24 1849 04/21/24 2233   04/21/24 1845  Ampicillin -Sulbactam (UNASYN ) 3 g in sodium chloride  0.9 % 100 mL IVPB        3 g 200 mL/hr over 30 Minutes Intravenous  Once 04/21/24 1839 04/21/24 1928        Assessment/Plan -History of left gluteal abscess s/p I&D 04/07/2024. Cx with gram stain: abundant WBC present predominantly PMN rare gram positive cocci in pairs, abundant Escherichia coli within mixed organs, no anaerobes isolated. Treated with IV antibiotics and then transitioned to PO Augmentin  -Return with left gluteal pain -CT Pelvis W Contrast from 04/21/2024 showed 5 x 1.3 cm left anterior perianal/perineal elongated possibly draining abscess/fistula. -WBC 10.1 from 7/24. CBC pending. -Exam completed. No surgical intervention recommended at this time. -Continue IV antibiotics -Recommend sitz baths TID -General surgery will sign off at this time. Please call with questions or concerns.   FEN: Modified Carb diet VTE: SCDs, Heparin  injection ID: [id number]     LOS: 2 days   I reviewed hospitalist notes, last 24 h vitals and pain scores, last 48 h intake and output, last 24 h labs and trends, and last 24 h imaging results.  This care required moderate level of medical decision making.    Marjorie Carlyon Favre, [facility] Surgery 04/23/2024, 11:09 AM Please see Amion for pager number during day hours 7:00am-4:30pm

## 2024-04-23 NOTE — Plan of Care (Signed)
  Problem: Education: Goal: Knowledge of General Education information will improve Description: Including pain rating scale, medication(s)/side effects and non-pharmacologic comfort measures 04/23/2024 0800 by Alaina Dozier PARAS, RN Outcome: Adequate for Discharge 04/23/2024 0757 by Alaina Dozier PARAS, RN Outcome: Progressing   Problem: Health Behavior/Discharge Planning: Goal: Ability to manage health-related needs will improve 04/23/2024 0800 by Alaina Dozier PARAS, RN Outcome: Adequate for Discharge 04/23/2024 0757 by Alaina Dozier PARAS, RN Outcome: Progressing   Problem: Clinical Measurements: Goal: Ability to maintain clinical measurements within normal limits will improve 04/23/2024 0800 by Alaina Dozier PARAS, RN Outcome: Adequate for Discharge 04/23/2024 0757 by Alaina Dozier PARAS, RN Outcome: Progressing Goal: Will remain free from infection 04/23/2024 0800 by Alaina Dozier PARAS, RN Outcome: Adequate for Discharge 04/23/2024 0757 by Alaina Dozier PARAS, RN Outcome: Progressing Goal: Diagnostic test results will improve Outcome: Adequate for Discharge Goal: Respiratory complications will improve Outcome: Adequate for Discharge Goal: Cardiovascular complication will be avoided Outcome: Adequate for Discharge   Problem: Activity: Goal: Risk for activity intolerance will decrease Outcome: Adequate for Discharge   Problem: Nutrition: Goal: Adequate nutrition will be maintained Outcome: Adequate for Discharge   Problem: Coping: Goal: Level of anxiety will decrease Outcome: Adequate for Discharge   Problem: Elimination: Goal: Will not experience complications related to bowel motility Outcome: Adequate for Discharge Goal: Will not experience complications related to urinary retention Outcome: Adequate for Discharge   Problem: Pain Managment: Goal: General experience of comfort will improve and/or be controlled Outcome: Adequate for Discharge    Problem: Safety: Goal: Ability to remain free from injury will improve Outcome: Adequate for Discharge   Problem: Skin Integrity: Goal: Risk for impaired skin integrity will decrease Outcome: Adequate for Discharge   Problem: Education: Goal: Ability to describe self-care measures that may prevent or decrease complications (Diabetes Survival Skills Education) will improve Outcome: Adequate for Discharge Goal: Individualized Educational Video(s) Outcome: Adequate for Discharge   Problem: Coping: Goal: Ability to adjust to condition or change in health will improve Outcome: Adequate for Discharge   Problem: Fluid Volume: Goal: Ability to maintain a balanced intake and output will improve Outcome: Adequate for Discharge   Problem: Health Behavior/Discharge Planning: Goal: Ability to identify and utilize available resources and services will improve Outcome: Adequate for Discharge Goal: Ability to manage health-related needs will improve Outcome: Adequate for Discharge   Problem: Metabolic: Goal: Ability to maintain appropriate glucose levels will improve Outcome: Adequate for Discharge   Problem: Nutritional: Goal: Maintenance of adequate nutrition will improve Outcome: Adequate for Discharge Goal: Progress toward achieving an optimal weight will improve Outcome: Adequate for Discharge   Problem: Skin Integrity: Goal: Risk for impaired skin integrity will decrease Outcome: Adequate for Discharge   Problem: Tissue Perfusion: Goal: Adequacy of tissue perfusion will improve Outcome: Adequate for Discharge

## 2024-04-26 ENCOUNTER — Telehealth: Payer: Self-pay

## 2024-04-26 NOTE — Transitions of Care (Post Inpatient/ED Visit) (Signed)
 04/26/2024  Name: Drew [name] MRN: [id number] DOB: January 03, 1976  Today's TOC FU Call Status: Today's TOC FU Call Status:: Successful TOC FU Call Completed TOC FU Call Complete Date: 04/26/24 Patient's Name and Date of Birth confirmed.  Transition Care Management Follow-up Telephone Call Date of Discharge: 04/23/24 Discharge Facility: [facility]) Type of Discharge: Inpatient Admission Primary Inpatient Discharge Diagnosis:: perineal abscess How have you been since you were released from the hospital?: Better Any questions or concerns?: Yes (He has an appt tomorrow with [facility] Couselor. he said he was not instructed to bring any documents with him, he has no one to accompany.I told him to bring all of his medications to the appt and we will try to have him meet with the clinical [facility].) Patient Questions/Concerns:: the patient is still not sure how he will pay his rent.  He was supposed to have 2 roommates coming in August but one of them has backed out.  That individual said that it was too much rent: $420/ month for each of the 2 roommates. That individual thought that the patient should be paying something and the patient said that he told him that he is not able to pay because he can't work. Patient Questions/Concerns Addressed: Other: (discussed patient's situation with Herlene Fleeta Morris, RPH and he will try to see the patient when he is at the clinic tomorrow if his schedule permits.)  Items Reviewed: Did you receive and understand the discharge instructions provided?: Yes (He has them but is not able to read them. He relies on a neighbor to read/review them for him) Medications obtained,verified, and reconciled?: No Medications Not Reviewed Reasons:: Other: (he is not able to see the medication list or med bottles to review the meds. I told him to bring them to his appt tomorrow at [facility] and we will try to have him meet with the clinical [facility].) Any new allergies since your  discharge?: No Dietary orders reviewed?: No (Patient has no money for food and relies on [employer] staff member to bring him food/ groceries.  He is not able to adhere to a specified diet. He currently does not have a rommate who will provide meal preparation.) Do you have support at home?: No (He currently lives alone and is not able to pay his rent. He has  back rent due in addition to his current rent. He has relied on donations from [employer] for assistance. He has a roommate that is to move in during August.)  Medications Reviewed Today: Medications Reviewed Today   Medications were not reviewed in this encounter     Home Care and Equipment/Supplies: Were Home Health Services Ordered?: No Any new equipment or medical supplies ordered?: No  Functional Questionnaire: Do you need assistance with bathing/showering or dressing?: No Do you need assistance with meal preparation?: Yes (a representative from [employer] has been assisting with groceries. he does the best he can with preparing a meal with his veyr limited vision.) Do you need assistance with eating?: No Do you have difficulty maintaining continence: No Do you need assistance with getting out of bed/getting out of a chair/moving?: Yes (When he is outside of his apartment, he needs someone with him to assist him to his destination. he is not able to travel alone.  He can navigate in his home because he is familiar with the surroundings.) Do you have difficulty managing or taking your medications?: Yes  Follow up appointments reviewed: PCP Follow-up appointment confirmed?:  Yes Date of PCP follow-up appointment?: 05/14/24 Follow-up Provider: [facility] Follow-up appointment confirmed?: Yes Date of Specialist follow-up appointment?: 05/06/24 Follow-Up Specialty Provider:: surgeon. Do you need transportation to your follow-up appointment?: Yes (We have arranged a cab ride to his  appointment tomorrow, 04/27/2024 with the [facility] financial counselor, as well as other appointments at [facility].  he will need to have someone accompany him to the appointment with the surgeon on 05/06/2024.) Transportation Need Intervention Addressed By:: Transportation Arranged Do you understand care options if your condition(s) worsen?: Yes-patient verbalized understanding    SIGNATURE  Slater Diesel, RN

## 2024-04-27 ENCOUNTER — Ambulatory Visit: Payer: Self-pay

## 2024-04-29 ENCOUNTER — Ambulatory Visit: Payer: Self-pay

## 2024-05-03 ENCOUNTER — Ambulatory Visit: Payer: Self-pay | Admitting: Pharmacist

## 2024-05-04 ENCOUNTER — Telehealth: Payer: Self-pay

## 2024-05-04 ENCOUNTER — Ambulatory Visit: Payer: Self-pay | Attending: Family Medicine

## 2024-05-04 ENCOUNTER — Other Ambulatory Visit (HOSPITAL_BASED_OUTPATIENT_CLINIC_OR_DEPARTMENT_OTHER): Payer: Self-pay | Admitting: Pharmacist

## 2024-05-04 DIAGNOSIS — Z794 Long term (current) use of insulin: Secondary | ICD-10-CM

## 2024-05-04 DIAGNOSIS — E10319 Type 1 diabetes mellitus with unspecified diabetic retinopathy without macular edema: Secondary | ICD-10-CM

## 2024-05-04 LAB — POCT GLYCOSYLATED HEMOGLOBIN (HGB A1C): HbA1c, POC (controlled diabetic range): 12.8 % — AB (ref 0.0–7.0)

## 2024-05-04 NOTE — Telephone Encounter (Signed)
 I met with the patient when he was in the clinic today to meet with Josselin Speas,  financial counselor and Herlene Salinas Ausdall,clinical pharmacist.   Shanda,  Spanish interpreter, was with the patient. I informed him of his appointment with [facility] on 05/06/2024 @ 1545. I provided him with the address and phone number for that clinic and explained to him that he will need to arrange a ride to that appointment as  the surgical clinic will not provide a ride and [facility] does not provide rides to specialty appointments. He stated that he understands and he may have a ride but he is not sure yet.  He then asked if the appointment on 05/14/2024 is about his eyes. I explained that appointment is with Dr Vicci, his PCP but he may not need that appointment if he sees the surgeon this week. I also explained that he has already been evaluated by 2 ophthalmologists and they were not able to help him.  We referred him to another ophthalmologist for a 3rd opinion but he needs to have the $200+ to pay for the appointment before he can be seen. I also explained that if that specialist recommends a treatment for his vision loss, he would need to pay out of pocket for that unless they have a patient assistance program. He said he understood and will need to check on getting the $200+ for the appointment.   He did not have any further questions and this clinic arranged for a cab ride home for him with [facility] [employer].

## 2024-05-04 NOTE — Progress Notes (Signed)
 S:     No chief complaint on file.  48 y.o. Drew [name] who presents for diabetes evaluation, education, and management. Patient arrives in good spirits and presents without any assistance.   Patient was last seen by Primary Care Provider, Dr. Vicci, on 04/15/2024.   PMH is significant for IDDM, diabetic retinopathy with R eye blindness, HTN w/ hx of hypertensive emergency, NSTEMI type II in the setting of demand ischemia secondary to hypertensive urgency in 06/2023.   At last visit with Dr. Vicci, we continued to manage his insulin  and help coordinate surgeon follow-up. Mr. Drew [name] is blind and has significant socioeconomic barriers to care.   Unfortunately, he was admitted 7/23 - 04/23/24 for worsening drainage of a left gluteal/perineal abscess. He had I&D performed with penrose drain placed by Dr. Vernetta. He was treated with Augmentin  in addition to ciprofloxacin . Completed Augmentin , still taking the Cipro . Endorses resolution of diarrhea. Denies any pain. He has an appt this month to see the surgeon for follow-up.   Regarding his DM, we changed his insulin  syringes to pens in the past so he can count the clicks associated with the dose dial of the pen device. Today, he tells me he is injecting 25 clicks (25 units BID). Additionally, we has him set for delivery with our pharmacy.   Family/Social History:  Fhx: no known positives Tobacco: never smoker  Alcohol: none reported   Current diabetes medications include: Lantus  25 units BID Patient reports adherence to taking all medications as prescribed.   Insurance coverage: none  Patient denies hypoglycemic events since last visit with Dr. Vicci.   Reported home fasting blood sugars: not checking  Reported 2 hour post-meal/random blood sugars: not checking.  Patient denies nocturia (nighttime urination).  Patient denies any changes in neuropathy (nerve pain). Patient denies any visual improvement. Patient denies self foot  exams.   Patient reported dietary habits: -Eating habits vary  -Some days, he skips meals as he cannot prepare his own meals and relies on those he lives with   Patient-reported exercise habits: none   O:  No GM present. No CGM in place.  Lab Results  Component Value Date   HGBA1C 12.8 (A) 05/04/2024   There were no vitals filed for this visit.  Lipid Panel     Component Value Date/Time   CHOL 156 06/17/2023 0415   TRIG 181 (H) 06/17/2023 0415   HDL 43 06/17/2023 0415   CHOLHDL 3.6 06/17/2023 0415   VLDL 36 06/17/2023 0415   LDLCALC 77 06/17/2023 0415   Clinical Atherosclerotic Cardiovascular Disease (ASCVD): No  The ASCVD Risk score (Arnett DK, et al., 2019) failed to calculate for the following reasons:   Risk score cannot be calculated because patient has a medical history suggesting prior/existing ASCVD   Patient is participating in a Managed Medicaid Plan: No   A/P: Diabetes longstanding currently uncontrolled. His A1c today is down to 12.8 (from >15.5 prior). Additionally, he is taking all PO medications and antibiotics as prescribed by Dr. Vicci. Patient's symptoms have improved from a hyperglycemic standpoint. He is not having any current hypoglycemia, however, we do not have a meter to check with at home. He is able to verbalize appropriate hypoglycemia management plan. Medication adherence seems to be improving. We just have a lot of barriers here. We will continue to have him auto-refill with home delivery using our pharmacy. -Continue Basaglar  25u BID. -Patient educated on purpose, proper use, and potential adverse effects of insulin , metformin  -  Extensively discussed pathophysiology of diabetes, recommended lifestyle interventions, dietary effects on blood sugar control.  -Counseled on s/sx of and management of hypoglycemia.  -Next A1c anticipated 07/2024.  Written patient instructions provided. Patient verbalized understanding of treatment plan.  Total time in  face to face counseling 30 minutes.    Follow-up:  Pharmacist prn. PCP later this month.   Herlene Fleeta Morris, PharmD, JAQUELINE, CPP Clinical Pharmacist [facility] [phone]

## 2024-05-06 ENCOUNTER — Other Ambulatory Visit: Payer: Self-pay

## 2024-05-10 ENCOUNTER — Other Ambulatory Visit: Payer: Self-pay

## 2024-05-13 ENCOUNTER — Telehealth: Payer: Self-pay | Admitting: Internal Medicine

## 2024-05-13 NOTE — Telephone Encounter (Signed)
 Copied from CRM #[id number]. Topic: General - Other >> May 13, 2024  2:32 PM [name] wrote:  Reason for CRM: **Spanish caller  Patient needing transportation to appointment tomorrow - Called [employer] for verification on how that works. Verified address on file good for pick up. [address] [name] [address] .  Per [employer] patient needs to be ready at 1:30 for cab.  Patient is requesting cab knock on door since he is unable to see, he is by himself and won't be able to see when they arrive. Patient also states he will need someone to meet him down stairs once he arrives to the clinic, states sometimes the cab will only drop him off and not assist with getting him inside of the actual office. Ty

## 2024-05-13 NOTE — Telephone Encounter (Signed)
 Transportation Voucher completed.   Pick up date and time: 05/14/2024 1:30 pm.  Pick up address: [address] [place] [address]   Drop off address: [address], [address]

## 2024-05-13 NOTE — Telephone Encounter (Signed)
 I called [facility] to inquire if the patient made it to his appointment on 8/7 and I was told he was a no show. They said he was rescheduled for 8/13/ and he cancelled due to lack of transportation and he has been rescheduled for today, 05/13/2024.

## 2024-05-13 NOTE — Telephone Encounter (Signed)
 Noted, Transportation setup for tomorrows appointment.

## 2024-05-13 NOTE — Progress Notes (Signed)
   PROVIDER:  PUJA GOSAI MACZIS, PA  MRN: [id number] DOB: 07-10-1976 DATE OF ENCOUNTER: 05/13/2024 Interval History:   Drew [name] is a 48 y.o. 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 Vernetta.  Culture revealed pansensitive E. coli.  Discharged on 04/12/2024 with 5 days Augmentin  after Penrose drain removal.  He presented to the ER on 04/21/2024 with increased pain at the I&D site.  At that time there was some macerated necrotic tissue and purulent drainage with erythema.  He was admitted but there was no obvious indication for another procedure.  He was discharged on 04/23/2024 with Cipro .  He is presenting to the office today for a wound check.  He states the area is slightly painful especially when having a bowel movement.  He also states he has been having fevers/chills off and on over the past few days.  He feels like this is occurring after he takes the second dose of Cipro  so he has only been taking 1 dose daily.  He has about 3 doses left.  His main concern is regarding diarrhea and some abdominal discomfort, which is causing him to not want to eat.  Review of Systems:   ROS All other systems reviewed and are negative.  Medications:   Current Outpatient Medications on File Prior to Visit  Medication Sig Dispense Refill  . amLODIPine  (NORVASC ) 5 MG tablet Take 5 mg by mouth once daily    . atorvastatin  (LIPITOR) 20 MG tablet Take 20 mg by mouth once daily    . carvediloL  (COREG ) 3.125 MG tablet Take 3.125 mg by mouth 2 (two) times daily with meals    . gabapentin  (NEURONTIN ) 300 MG capsule Take 300 mg by mouth at bedtime    . insulin  GLARGINE (BASAGLAR  KWIKPEN U-100 INSULIN ) pen injector (concentration 100 units/mL) Inject 25 Units subcutaneously     No current facility-administered medications on file prior to visit.    Physical Examination:   BP (!) 141/86   Pulse 105   Temp 36.7 C (98 F)   Ht 165.1 cm (5' 5)    Wt 67.1 kg (148 lb)   SpO2 99%   BMI 24.63 kg/m   General: Well-developed, well-nourished, in no acute distress.   Left anterior perianal region: There is a 2 cm wound that is almost completely healed without surrounding erythema or drainage.  There is mild tenderness to palpation but no fluctuance or swelling  A chaperone, Philis Seats, CMA, was present during the exam.    Assessment and Plan:   Diagnoses and all orders for this visit:  Perianal abscess  Status post incision and drainage    Drew [name] is status post incision and drainage perianal abscess on 04/07/2024 by Dr. Vernetta.  On exam, there are no signs of obvious infection.  He was advised to complete the antibiotics as prescribed.  Hopefully once the antibiotics are complete his diarrhea will resolve.  I recommended probiotics/Activia yogurt.  He was advised to call us  if he develops increased pain or swelling to the area.  He agrees with this plan.  Return if symptoms worsen or fail to improve.  Puja Maczis, [facility]

## 2024-05-14 ENCOUNTER — Ambulatory Visit: Payer: Self-pay | Attending: Internal Medicine | Admitting: Internal Medicine

## 2024-05-14 ENCOUNTER — Other Ambulatory Visit: Payer: Self-pay

## 2024-05-14 ENCOUNTER — Telehealth: Payer: Self-pay | Admitting: Internal Medicine

## 2024-05-14 ENCOUNTER — Encounter: Payer: Self-pay | Admitting: Internal Medicine

## 2024-05-14 VITALS — BP 113/74 | HR 104 | Temp 98.1°F | Ht 63.0 in | Wt 141.0 lb

## 2024-05-14 DIAGNOSIS — Z1211 Encounter for screening for malignant neoplasm of colon: Secondary | ICD-10-CM

## 2024-05-14 DIAGNOSIS — R197 Diarrhea, unspecified: Secondary | ICD-10-CM

## 2024-05-14 DIAGNOSIS — D649 Anemia, unspecified: Secondary | ICD-10-CM

## 2024-05-14 DIAGNOSIS — Z7984 Long term (current) use of oral hypoglycemic drugs: Secondary | ICD-10-CM

## 2024-05-14 DIAGNOSIS — L0231 Cutaneous abscess of buttock: Secondary | ICD-10-CM

## 2024-05-14 DIAGNOSIS — Z09 Encounter for follow-up examination after completed treatment for conditions other than malignant neoplasm: Secondary | ICD-10-CM

## 2024-05-14 DIAGNOSIS — E1142 Type 2 diabetes mellitus with diabetic polyneuropathy: Secondary | ICD-10-CM

## 2024-05-14 DIAGNOSIS — E113593 Type 2 diabetes mellitus with proliferative diabetic retinopathy without macular edema, bilateral: Secondary | ICD-10-CM

## 2024-05-14 DIAGNOSIS — Z794 Long term (current) use of insulin: Secondary | ICD-10-CM

## 2024-05-14 LAB — GLUCOSE, POCT (MANUAL RESULT ENTRY): POC Glucose: 360 mg/dL — AB (ref 70–99)

## 2024-05-14 MED ORDER — GABAPENTIN 300 MG PO CAPS
300.0000 mg | ORAL_CAPSULE | Freq: Two times a day (BID) | ORAL | 3 refills | Status: DC
  Filled 2024-05-14 – 2024-06-02 (×3): qty 180, 90d supply, fill #0
  Filled 2024-08-24: qty 180, 90d supply, fill #1

## 2024-05-14 MED ORDER — GLIMEPIRIDE 2 MG PO TABS
2.0000 mg | ORAL_TABLET | Freq: Every day | ORAL | 2 refills | Status: DC
  Filled 2024-05-14: qty 90, 90d supply, fill #0
  Filled 2024-08-09 (×3): qty 90, 90d supply, fill #1

## 2024-05-14 MED ORDER — LOPERAMIDE HCL 2 MG PO TABS
2.0000 mg | ORAL_TABLET | Freq: Three times a day (TID) | ORAL | 1 refills | Status: DC | PRN
  Filled 2024-05-14: qty 24, 8d supply, fill #0
  Filled 2024-07-21: qty 24, 8d supply, fill #1

## 2024-05-14 NOTE — Telephone Encounter (Signed)
 Transportation Voucher completed.   Pick up date and time: 07/23/2024 2:30 pm.  Pick up address: [address] [place] [address]   Drop off address: [address] [address]

## 2024-05-14 NOTE — Progress Notes (Signed)
 Patient ID: Drew [name], male    DOB: Sep 03, 1976  MRN: [id number]  CC: Hospitalization Follow-up (Hospitalization f/u. /Pt confirmed going to [facility] sx on 05/13/24./On-going abdominal pain, diarrhea. )   Subjective: Drew [name] is a 48 y.o. male who presents for chronic ds management. 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 #[id number], [place]   Discussed the use of AI scribe software for clinical note transcription with the patient, who gave verbal consent to proceed.  History of Present Illness Drew [name] is a 48 year old male with recurrent rectal abscess who presents for follow-up after recent hospitalization.  He was hospitalized at the end of last month for a recurrent abscess in the rectal area.  From discharge summary, it appears that no further surgical procedure was done.  He was discharged with a prescription for ciprofloxacin  which he was supposed to be taking twice a day but states that he started taking once a day due to stomach discomfort.  He has 2 pills remaining.  He was seen by the PA at [facility] surgery and according to her note it seems that he is healing well.  He experiences diarrhea, which initially improved but has since returned. He has gurgling sensations in his stomach after consuming food or drink, followed by an urgent need to use the bathroom. Stool tests for C. difficile conducted during his hospitalization was negative.   DM: He has a history of diabetes and is currently taking 25 units of Basaglar  insulin  twice daily. During his recent hospitalization, his A1c level was elevated at 12.8 but showed slight improvement. His blood sugar level today was 360.  He has diabetic peripheral neuropathy and reports numbness in the legs from the knees down into  the feet.  Should be on gabapentin  300 mg at bedtime.  States that he just had some delivered and plans to resume taking it. He is still wanting another opinion in regards to his size.  He has seen 2 ophthalmologist already.  Has to come up with over $200 to see another ophthalmologist which he states he is willing to do.     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) 07/14/2023   NSTEMI (non-ST elevated myocardial infarction) (HCC) 06/17/2023   Hypertensive urgency 06/17/2023   Hypertensive emergency 06/17/2023   Hyperglycemic crisis due to diabetes mellitus (HCC) 06/17/2023   Headache 06/17/2023   Diabetic retinopathy (HCC) 06/17/2023   Blindness of right eye 06/17/2023   Insulin  dependent type 1 diabetes mellitus (HCC) 06/17/2023   AKI (acute kidney injury) (HCC) 06/17/2023   DKA (diabetic ketoacidosis) (HCC) 02/14/2014   Nausea with vomiting 02/14/2014     Current Outpatient Medications on File Prior to Visit  Medication Sig Dispense Refill   amLODipine  (NORVASC ) 5 MG tablet Take 1 tablet (5 mg total) by mouth daily. 90 tablet 1   atorvastatin  (LIPITOR) 20 MG tablet Take 1 tablet (20 mg total) by mouth daily. 90 tablet 1   carvedilol  (COREG ) 3.125 MG tablet Take 1  tablet (3.125 mg total) by mouth 2 (two) times daily with a meal. 180 tablet 2   ibuprofen  (ADVIL ) 600 MG tablet Take 1 tablet (600 mg total) by mouth every 8 (eight) hours as needed. (Patient taking differently: Take 600 mg by mouth every 8 (eight) hours as needed for mild pain (pain score 1-3) or moderate pain (pain score 4-6).) 30 tablet 0   Insulin  Glargine (BASAGLAR  KWIKPEN) 100  UNIT/ML Inject 25 Units into the skin 2 (two) times daily. 36 mL 1   Insulin  Pen Needle (PEN NEEDLES) 32G X 4 MM MISC Use to inject Basaglar  once daily. 100 each 3   Insulin  Syringe-Needle U-100 (TRUEPLUS INSULIN  SYRINGE) 31G X 5/16 0.5 ML MISC Use to inject 70/30 insulin  twice daily. 100 each 6   miconazole  (MICATIN) 2 % cream Apply 1 Application topically 2 (two) times daily. 30 g 0   nystatin  cream (MYCOSTATIN ) Apply topically 2 (two) times daily. Apply to penile area, apply after retracting foreskin 30 g 0   oxyCODONE  (OXY IR/ROXICODONE ) 5 MG immediate release tablet Take 1 tablet (5 mg total) by mouth every 6 (six) hours as needed for moderate pain (pain score 4-6) or severe pain (pain score 7-10). 15 tablet 0   No current facility-administered medications on file prior to visit.    No Known Allergies  Social History   Socioeconomic History   Marital status: Single    Spouse name: Not on file   Number of children: Not on file   Years of education: Not on file   Highest education level: Not on file  Occupational History   Not on file  Tobacco Use   Smoking status: Never   Smokeless tobacco: Never  Vaping Use   Vaping status: Never Used  Substance and Sexual Activity   Alcohol use: No   Drug use: No   Sexual activity: Not on file  Other Topics Concern   Not on file  Social History Narrative   Not on file   Social Drivers of Health   Financial Resource Strain: High Risk (01/28/2024)   Overall Financial Resource Strain (CARDIA)    Difficulty of Paying Living Expenses: Very hard  Food Insecurity: Food Insecurity Present (04/22/2024)   Hunger Vital Sign    Worried About Running Out of Food in the Last Year: Often true    Ran Out of Food in the Last Year: Often true  Transportation Needs: Unmet Transportation Needs (04/22/2024)   PRAPARE - Administrator, Civil Service (Medical): Yes    Lack of Transportation (Non-Medical): Yes  Physical Activity: Not on file   Stress: Not on file  Social Connections: Not on file  Intimate Partner Violence: Not At Risk (04/22/2024)   Humiliation, Afraid, Rape, and Kick questionnaire    Fear of Current or Ex-Partner: No    Emotionally Abused: No    Physically Abused: No    Sexually Abused: No    No family history on file.  Past Surgical History:  Procedure Laterality Date   IRRIGATION AND DEBRIDEMENT BUTTOCKS N/A 04/07/2024   Procedure: IRRIGATION AND DEBRIDEMENT BUTTOCKS;  Surgeon: Vernetta Berg, MD;  Location: [facility];  Service: General;  Laterality: N/A;    ROS: Review of Systems Negative except as stated above  PHYSICAL EXAM: BP 113/74 (BP Location: Left Arm, Patient Position: Sitting, Cuff Size: Normal)   Pulse (!) 104   Temp 98.1 F (36.7 C) (Oral)   Ht 5' 3 (1.6 m)   Wt 141  lb (64 kg)   SpO2 98%   BMI 24.98 kg/m   Physical Exam   General appearance - alert, well appearing, and in no distress Mental status - normal mood, behavior, speech, dress, motor activity, and thought processes Chest - clear to auscultation, no wheezes, rales or rhonchi, symmetric air entry Heart - normal rate, regular rhythm, normal S1, S2, no murmurs, rubs, clicks or gallops Extremities - peripheral pulses normal, no pedal edema, no clubbing or cyanosis Diabetic Foot Exam - Simple   Simple Foot Form Diabetic Foot exam was performed with the following findings: Yes 05/14/2024  2:33 PM  Visual Inspection See comments: Yes Sensation Testing See comments: Yes Pulse Check Posterior Tibialis and Dorsalis pulse intact bilaterally: Yes Comments Toenails are discolored and overgrown.  Decreased sensation on the plantar surface of both feet.        Latest Ref Rng & Units 04/23/2024    9:37 AM 04/22/2024    3:36 AM 04/21/2024    1:57 PM  CMP  Glucose 70 - 99 mg/dL 759  705  819   BUN 6 - 20 mg/dL 13  14  18    Creatinine 0.61 - 1.24 mg/dL 9.53  9.39  9.40   Sodium 135 - 145 mmol/L 139  135  135   Potassium 3.5  - 5.1 mmol/L 3.7  3.3  3.5   Chloride 98 - 111 mmol/L 107  108  104   CO2 22 - 32 mmol/L 24  22  25    Calcium  8.9 - 10.3 mg/dL 8.5  8.4  8.7   Total Protein 6.5 - 8.1 g/dL   7.3   Total Bilirubin 0.0 - 1.2 mg/dL   0.8   Alkaline Phos 38 - 126 U/L   191   AST 15 - 41 U/L   31   ALT 0 - 44 U/L   54    Lipid Panel     Component Value Date/Time   CHOL 156 06/17/2023 0415   TRIG 181 (H) 06/17/2023 0415   HDL 43 06/17/2023 0415   CHOLHDL 3.6 06/17/2023 0415   VLDL 36 06/17/2023 0415   LDLCALC 77 06/17/2023 0415    CBC    Component Value Date/Time   WBC 8.1 04/23/2024 0937   RBC 3.84 (L) 04/23/2024 0937   HGB 10.9 (L) 04/23/2024 0937   HCT 32.9 (L) 04/23/2024 0937   PLT 324 04/23/2024 0937   MCV 85.7 04/23/2024 0937   MCH 28.4 04/23/2024 0937   MCHC 33.1 04/23/2024 0937   RDW 12.9 04/23/2024 0937   LYMPHSABS 2.6 04/21/2024 1357   MONOABS 0.6 04/21/2024 1357   EOSABS 0.2 04/21/2024 1357   BASOSABS 0.1 04/21/2024 1357    ASSESSMENT AND PLAN: 1. Hospital discharge follow-up (Primary)   2. Gluteal abscess Seen by surgical PA yesterday.  Patient to complete Cipro  antibiotics though I am concerned that resistance may have developed given that he was taking it once a day instead of twice a day.  3. Diarrhea, unspecified type - loperamide  (IMODIUM  A-D) 2 MG tablet; Take 1 tablet (2 mg total) by mouth 3 (three) times daily as needed for diarrhea or loose stools.  Dispense: 30 tablet; Refill: 1 - Ambulatory referral to Gastroenterology - Clostridium Difficile by PCR([facility] only ) - Stool culture  4. 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  Continue Basaglar  insulin  25 units twice a day.  We discussed adding mealtime insulin  letting him know that he would be able to tell the  difference between the 2 pens by feeling the lines at the top of the dispenser.  However he states he would prefer oral medication.  I will not use metformin  since he is already having issues with chronic diarrhea.  Will add Amaryl  2 mg daily instead. -I will touch base with our caseworker next week to see if anything has changed regarding funding for him to see the ophthalmologist again - POCT glucose (manual entry) - glimepiride  (AMARYL ) 2 MG tablet; Take 1 tablet (2 mg total) by mouth daily before breakfast.  Dispense: 90 tablet; Refill: 2  5. Diabetic polyneuropathy associated with type 2 diabetes mellitus (HCC) Continue gabapentin  but increase the dose to twice a day. - gabapentin  (NEURONTIN ) 300 MG capsule; Take 1 capsule (300 mg total) by mouth 2 (two) times daily.  Dispense: 180 capsule; Refill: 3  6. Normocytic anemia - Iron, TIBC and Ferritin Panel - CBC  7. Screening for colon cancer - Fecal occult blood, imunochemical([facility])    Patient was given the opportunity to ask questions.  Patient verbalized understanding of the plan and was able to repeat key elements of the plan.   This documentation was completed using Paediatric nurse.  Any transcriptional errors are unintentional.  Orders Placed This Encounter  Procedures   Fecal occult blood, imunochemical([facility])   Clostridium Difficile by PCR([facility] only )   Stool culture   Iron, TIBC and Ferritin Panel   CBC   Ambulatory referral to Gastroenterology   POCT glucose (manual entry)     Requested Prescriptions   Signed Prescriptions Disp Refills   loperamide  (IMODIUM  A-D) 2 MG tablet 30 tablet 1    Sig: Take 1 tablet (2 mg total) by mouth 3 (three) times daily as needed for diarrhea or loose stools.   glimepiride  (AMARYL ) 2 MG tablet 90 tablet 2    Sig: Take 1 tablet (2 mg total) by mouth daily before breakfast.   gabapentin  (NEURONTIN ) 300 MG capsule 180 capsule 3    Sig: Take  1 capsule (300 mg total) by mouth 2 (two) times daily.    Return in about 10 weeks (around 07/23/2024).  Barnie Louder, MD, FACP

## 2024-05-14 NOTE — Telephone Encounter (Signed)
 Patient came in-person with PCP on 05/14/2024 to address gastro concerns. Additionally he confirmed that he went to his appointment on 05/13/2024 at [facility]. He states that a nurse saw him who informed patient that his abscess is healing well. Patient unclear about any thing else mentioned at that appointment and the name of the provider at that visit.   Patient inquired about eyesight and seeing an ophthalmologist. Explained to patient that he must have the $200 first before scheduling an appointment as we cannot keep calling to schedule appointments and subsequently cancel. Reminded patient that the $200 would be for the initial appointment and he would also need to be financially responsible for the treatment afterwards. Patient expressed verbal understanding.

## 2024-05-14 NOTE — Telephone Encounter (Signed)
 Transportation Voucher completed.   Pick up date and time: 05/14/2024 3:03 pm.  Pick up address: [address] [employer] [address] [address]  Drop off address: [address] [place] [address]

## 2024-05-15 ENCOUNTER — Ambulatory Visit: Payer: Self-pay | Admitting: Internal Medicine

## 2024-05-15 LAB — CBC
Hematocrit: 41.6 % (ref 37.5–51.0)
Hemoglobin: 13.4 g/dL (ref 13.0–17.7)
MCH: 29.3 pg (ref 26.6–33.0)
MCHC: 32.2 g/dL (ref 31.5–35.7)
MCV: 91 fL (ref 79–97)
Platelets: 203 x10E3/uL (ref 150–450)
RBC: 4.58 x10E6/uL (ref 4.14–5.80)
RDW: 13.2 % (ref 11.6–15.4)
WBC: 7.5 x10E3/uL (ref 3.4–10.8)

## 2024-05-15 LAB — IRON,TIBC AND FERRITIN PANEL
Ferritin: 249 ng/mL (ref 30–400)
Iron Saturation: 34 % (ref 15–55)
Iron: 113 ug/dL (ref 38–169)
Total Iron Binding Capacity: 337 ug/dL (ref 250–450)
UIBC: 224 ug/dL (ref 111–343)

## 2024-05-15 LAB — CLOSTRIDIUM DIFFICILE BY PCR: Toxigenic C. Difficile by PCR: NEGATIVE

## 2024-05-16 LAB — FECAL OCCULT BLOOD, IMMUNOCHEMICAL: Fecal Occult Bld: NEGATIVE

## 2024-05-17 ENCOUNTER — Other Ambulatory Visit: Payer: Self-pay

## 2024-05-17 NOTE — Telephone Encounter (Signed)
 Message received from Toni Northern, [employer] Case Manager stating that they were able to get transportation for the patient to his appointment at [facility] on 05/13/2024.  She also shared the following information:   His main source of income has been renting out his two rooms to cover rent. At the moment, only one room is rented, and we are assisting him in finding another tenant for the second room so he does not fall behind on rent payments. Two peer navigators have been visiting him bi-weekly to provide food and clothing. However, their time with us  is coming to an end; one will finish next week, and the other on August 29th. We are now looking for another volunteer who can collect and drop off food for him.   I have informed her that I I looked through his records and he has been approved for [employer], so that is an option for him.  I know there is a small charge; but he really needs to take advantage of that service.  I also informed her that the ophthalmologist we referred him to is [facility]: [phone].  The referral should still be active, so when he has the funds and someone to accompany him to the appointment, an appointment can be scheduled.  When I called [facility] in the spring the cost was $209 for the consultation that he needs to pay upfront.  I spoke to [facility], [facility]: [phone] about financial assistance for this appointment.  He was in contact with Mr. Stanforth; but Mr. Ebron did not have the supporting documents to be accepted for financial assistance.

## 2024-05-18 LAB — STOOL CULTURE: E coli, Shiga toxin Assay: NEGATIVE

## 2024-05-26 ENCOUNTER — Encounter: Payer: Self-pay | Admitting: Nurse Practitioner

## 2024-05-26 ENCOUNTER — Telehealth: Payer: Self-pay | Admitting: Internal Medicine

## 2024-05-26 ENCOUNTER — Encounter: Payer: Self-pay | Admitting: Physician Assistant

## 2024-05-26 NOTE — Telephone Encounter (Signed)
 Copied from CRM [id number]. Topic: Referral - Request for Referral >> May 26, 2024  4:28 PM Turkey B wrote:  Did the patient discuss referral with their provider in the last year? yes    Type of order/referral and detailed reason for visit: want referral for help with referral to help with his eyesight  Preference of office, provider, location: ? Office that's within [place]  If referral order, have you been seen by this specialty before? no  Can we respond through MyChart? no

## 2024-05-27 ENCOUNTER — Telehealth: Payer: Self-pay | Admitting: Licensed Clinical Social Worker

## 2024-05-27 ENCOUNTER — Telehealth: Payer: Self-pay | Admitting: Internal Medicine

## 2024-05-27 NOTE — Telephone Encounter (Signed)
 Copied from CRM [id number]. Topic: General - Transportation >> May 26, 2024  4:23 PM Turkey B wrote: Reason for CRM: Patient needs ride for appt 10/20 at [facility]

## 2024-05-27 NOTE — Telephone Encounter (Signed)
 H&V Care Navigation CSW Progress Note  Clinical Social Worker contacted Pensions consultant to discuss letter of support needed for [employer] application. Michel with [employer] will assist with letter or has spoken with friends who may be able to assist with letter of support for pt.   Patient is participating in a Managed Medicaid Plan:  No, self pay only  SDOH Screenings   Food Insecurity: Food Insecurity Present (04/22/2024)  Housing: Unknown (05/13/2024)   Received from [facility]  Recent Concern: Housing - High Risk (04/22/2024)  Transportation Needs: Unmet Transportation Needs (04/22/2024)  Utilities: At Risk (04/22/2024)  Depression (PHQ2-9): Low Risk  (09/19/2023)  Financial Resource Strain: High Risk (01/28/2024)  Tobacco Use: Low Risk  (05/14/2024)  Health Literacy: Inadequate Health Literacy (03/24/2024)     Marit Lark, MSW, LCSW Clinical Social Worker II [facility] Health Heart/Vascular Care Navigation  [phone]- work cell phone (preferred)

## 2024-05-28 ENCOUNTER — Ambulatory Visit: Payer: Self-pay | Admitting: Internal Medicine

## 2024-06-01 ENCOUNTER — Other Ambulatory Visit: Payer: Self-pay

## 2024-06-02 ENCOUNTER — Other Ambulatory Visit: Payer: Self-pay

## 2024-06-02 NOTE — Telephone Encounter (Signed)
 Toni Burnet, [employer], is working with the patient to obtain a letter of support to complete the CAFA/ OC applications.    I sent a message to [place] informing her that, unless their status has changed, the ophthalmologist that we are referring the patient to for a third opinion is [facility] and they do not accept CAFA/ OC

## 2024-06-02 NOTE — Telephone Encounter (Signed)
 Noted! Thank you

## 2024-06-08 NOTE — Telephone Encounter (Signed)
 H&V Care Navigation CSW Progress Note  Clinical Social Worker sent additional f/u to [employer] regarding letter of support- needed by 9/12. Will f/u again on 9/11 if still not received.  Patient is participating in a Managed Medicaid Plan:  No, self pay only  SDOH Screenings   Food Insecurity: Food Insecurity Present (04/22/2024)  Housing: Unknown (05/13/2024)   Received from [facility]  Recent Concern: Housing - High Risk (04/22/2024)  Transportation Needs: Unmet Transportation Needs (04/22/2024)  Utilities: At Risk (04/22/2024)  Depression (PHQ2-9): Low Risk  (09/19/2023)  Financial Resource Strain: High Risk (01/28/2024)  Tobacco Use: Low Risk  (05/14/2024)  Health Literacy: Inadequate Health Literacy (03/24/2024)    Marit Lark, MSW, LCSW Clinical Social Worker II [facility] Health Heart/Vascular Care Navigation  [phone]- work cell phone (preferred)

## 2024-06-09 ENCOUNTER — Other Ambulatory Visit: Payer: Self-pay

## 2024-06-09 ENCOUNTER — Telehealth: Payer: Self-pay | Admitting: Internal Medicine

## 2024-06-09 NOTE — Telephone Encounter (Signed)
 Copied from CRM [id number]. Topic: General - Call Back - No Documentation >> Jun 09, 2024 11:27 AM DeAngela L wrote: Reason for CRM: patient calling to speak with Clarisa again he is calling about the eye appointment and wanted to speak with her, they previous spoke about payment and having an appointment and he would like to discuss further  Patient was also told about a free eye appointment by Toni SQUIBB.  he is calling to discuss with Clarisa cause he was never able to call Toni Burnet to discuss signing paperwork, he states her phone number never worked for him to leave her a message Patient is also asking if Clarisa still wants the money for the doctors appointment for next week or when could this appointment be set up? Patient would like to also ask if Clarisa could send a message to Toni SQUIBB since patient was never able to speak with her when he called   Pt num [phone] (M)

## 2024-06-10 NOTE — Telephone Encounter (Signed)
 Hi Daniela,  We will not be able to provide transportation to his GI appointment. He will need to ask that clinic if they will provide a ride and there is a good chance, they won't.  He has been approved for [facility] [place] and that would cost $2.50 each way. He really needs to speak to Michel/ [employer] about this. And he will need to have someone accompany him to the appointment.

## 2024-06-11 ENCOUNTER — Telehealth: Payer: Self-pay | Admitting: Licensed Clinical Social Worker

## 2024-06-11 NOTE — Telephone Encounter (Signed)
 H&V Care Navigation CSW Progress Note  Clinical Retail banker from [place] with [employer] to f/u on application for [employer]. Sent letter securely to [place] with patient accounting for processing. Additional information regarding referrals to eye doctor received, will f/u with [facility] regarding any options pt may have. Unfortunately [facility] is the only [facility] clinic (not just affiliated) and does not accept CAFA. Remain available as needed, pt has [facility] appt in 12/25.  Patient is participating in a Managed Medicaid Plan:  No, self pay only  SDOH Screenings   Food Insecurity: Food Insecurity Present (04/22/2024)  Housing: Unknown (05/13/2024)   Received from [facility]  Recent Concern: Housing - High Risk (04/22/2024)  Transportation Needs: Unmet Transportation Needs (04/22/2024)  Utilities: At Risk (04/22/2024)  Depression (PHQ2-9): Low Risk  (09/19/2023)  Financial Resource Strain: High Risk (01/28/2024)  Tobacco Use: Low Risk  (05/14/2024)  Health Literacy: Inadequate Health Literacy (03/24/2024)    Marit Lark, MSW, LCSW Clinical Social Worker II [facility] Health Heart/Vascular Care Navigation  [phone]- work cell phone (preferred)

## 2024-06-15 ENCOUNTER — Telehealth: Payer: Self-pay | Admitting: Licensed Clinical Social Worker

## 2024-06-15 NOTE — Telephone Encounter (Signed)
 H&V Care Navigation CSW Progress Note  Clinical Social Worker received approval that pt was approved for [employer]. Sent pt copies of approval letters. I also made note on upcoming appts regarding approval. Routed email also to [facility] at PCP clinic.  CHARITY COMPLETE. PATIENT APPROVED FOR 100% FINANCIAL ASSISTANCE PER CAFA APPLICATION AND SUPPORTING DOCUMENTATION. CAFA APP SCANNED TO ACCOUNT [id number] APPROVAL DATES OF FPL ---- 05/04/24 - 11/04/24 APPROVAL LETTER ON ACCOUNT [id number]  .  Patient is participating in a Managed Medicaid Plan:  No, CAFA 100%  SDOH Screenings   Food Insecurity: Food Insecurity Present (04/22/2024)  Housing: Unknown (05/13/2024)   Received from [facility]  Recent Concern: Housing - High Risk (04/22/2024)  Transportation Needs: Unmet Transportation Needs (04/22/2024)  Utilities: At Risk (04/22/2024)  Depression (PHQ2-9): Low Risk  (09/19/2023)  Financial Resource Strain: High Risk (01/28/2024)  Tobacco Use: Low Risk  (05/14/2024)  Health Literacy: Inadequate Health Literacy (03/24/2024)    Drew [name], MSW, LCSW Clinical Social Worker II [facility] Health Heart/Vascular Care Navigation  [phone]- work cell phone (preferred)

## 2024-06-29 ENCOUNTER — Ambulatory Visit: Payer: Self-pay

## 2024-06-29 NOTE — Telephone Encounter (Signed)
 Pt would like to be worked into the schedule on a Monday  for possible infection: please call pt    FYI Only or Action Required?: Action required by provider: request for appointment.  Patient was last seen in primary care on 05/14/2024 by [name], MD.  Called Nurse Triage reporting Pain.  Symptoms began couple days.  Interventions attempted: Nothing.  Symptoms are: gradually worsening.  Triage Disposition: See Physician Within 24 Hours  Patient/caregiver understands and will follow disposition?: Yes    Interpreter [place] Ko [id number] on line translating for pt  Copied from CRM #[id number]. Topic: Clinical - Red Word Triage >> Jun 29, 2024  4:53 PM [name] wrote: Red Word that prompted transfer to Nurse Triage: Stomach pains after he eats and has to go to the bathroom. Also, the area where he had his surgery, he has a lot of inflammation . He believes he has a infection. Reason for Disposition  [1] Looks infected (e.g., spreading redness, pus) AND [2] no fever  [1] Few streaks of blood in vomit AND [2] occurred one time  Answer Assessment - Initial Assessment Questions 1. LOCATION: Where does it hurt?      Middle stomach and a little to left under breasts 2. RADIATION: Does the pain shoot anywhere else? (e.g., chest, back)     no 3. ONSET: When did the pain begin? (Minutes, hours or days ago)      04/21/2024 hospital admission - pt has been hurting ever since 4. SUDDEN: Gradual or sudden onset?     na 5. PATTERN Does the pain come and go, or is it constant?     Comes and foes 6. SEVERITY: How bad is the pain?  (e.g., Scale 1-10; mild, moderate, or severe)     8/10  7. RECURRENT SYMPTOM: Have you ever had this type of stomach pain before? If Yes, ask: When was the last time? and What happened that time?      Yes - unknown 8. CAUSE: What do you think is causing the stomach pain? (e.g., gallstones, recent abdominal surgery)     unknown 9.  RELIEVING/AGGRAVATING FACTORS: What makes it better or worse? (e.g., antacids, bending or twisting motion, bowel movement)     na 10. OTHER SYMPTOMS: Do you have any other symptoms? (e.g., back pain, diarrhea, fever, urination pain, vomiting)       Diarrhea, fever, body chills  Answer Assessment - Initial Assessment Questions 1. LOCATION: Where is the wound located?    left  Under buttocks, close testicles - pt had surgery in July 2025 2. WOUND APPEARANCE: What does the wound look like?      Swelling, fluid in wound, smells bad 3. SIZE: If redness is present, ask: What is the size of the red area? (Inches, centimeters, or compare to size of a coin)      unknown 4. SPREAD: What's changed in the last day?  Do you see any red streaks coming from the wound?     na 5. ONSET: When did it start to look infected?      July 2025 6. MECHANISM: How did the wound start, what was the cause?     surgery 7. PAIN: Do you have any pain?  If Yes, ask: How bad is the pain?  (e.g., Scale 1-10; mild, moderate, or severe)     moderate 8. FEVER: Do you have a fever? If Yes, ask: What is your temperature, how was it measured, and when did it  start?     Fever  9. OTHER SYMPTOMS: Do you have any other symptoms? (e.g., shaking chills, weakness, rash elsewhere on body)     Chills,  10. PREGNANCY: Is there any chance you are pregnant? When was your last menstrual period?       Na  Nurse not able to find any appts on a Monday to schedule a pt with possible infection.  Sending information to CAL .  Protocols used: Abdominal Pain - Male-A-AH, Wound Infection Suspected-A-AH

## 2024-06-30 NOTE — Telephone Encounter (Signed)
 Call to patient . Unable to reach or leave message. Recording expressed call unable to completed.   If patient calls back , please let him know that it is advised that he contacts the surgery that did his surgery or got to UC. Also please verify contact #

## 2024-07-05 ENCOUNTER — Other Ambulatory Visit: Payer: Self-pay

## 2024-07-05 ENCOUNTER — Other Ambulatory Visit: Payer: Self-pay | Admitting: Internal Medicine

## 2024-07-05 ENCOUNTER — Ambulatory Visit: Payer: Self-pay

## 2024-07-05 DIAGNOSIS — E113593 Type 2 diabetes mellitus with proliferative diabetic retinopathy without macular edema, bilateral: Secondary | ICD-10-CM

## 2024-07-05 DIAGNOSIS — R197 Diarrhea, unspecified: Secondary | ICD-10-CM

## 2024-07-05 NOTE — Telephone Encounter (Signed)
FYI Pt has been scheduled  

## 2024-07-05 NOTE — Telephone Encounter (Signed)
 FYI Only or Action Required?: Action required by provider: request for appointment and medication refill request.  Patient was last seen in primary care on 05/14/2024 by Drew Barnie NOVAK, MD.  Called Nurse Triage reporting Abdominal Pain.  Symptoms began about a month ago.  Interventions attempted: Nothing.  Symptoms are: gradually worsening.  Triage Disposition: See Physician Within 24 Hours  Patient/caregiver understands and will follow disposition?: No, wishes to speak with PCP            Copied from CRM #[id number]. Topic: Clinical - Red Word Triage >> Jun 29, 2024  4:53 PM Drew [name] wrote: Red Word that prompted transfer to Nurse Triage: Stomach pains after he eats and has to go to the bathroom. Also, the area where he had his surgery, he has a lot of inflammation . He believes he has a infection. >> Jul 05, 2024  2:35 PM Drew [name] wrote: Patient returned call. Stomach pain is worsening but he does not feel it is related to surgery. He eats something and it causes pain and irritation. Would like to be triaged again. Reason for Disposition  [1] MODERATE pain (e.g., interferes with normal activities) AND [2] pain comes and goes (cramps) AND [3] present > 24 hours  (Exception: Pain with Vomiting or Diarrhea - see that Guideline.)  Answer Assessment - Initial Assessment Questions This RN spoke with the patient regarding his symptoms with the interrupter ID [id number].  Took loperamide  (IMODIUM  A-D) 2 MG tablet for symptoms but ran out of medicine 3 weeks ago. Patient denies any chest pain currently. Patient requesting a refill of his medications and eye drops he has used in the past for symptoms. Patient also given information to a UC to be seen for symptoms [facility] [place]). Patient unsure if he will go to be seen. Patient requesting a call back regarding the request. Best number is [phone].     1. LOCATION: Where does it hurt?      All over the abdomen area   2. RADIATION: Does the pain shoot anywhere else? (e.g., chest, back)     He states the pain radiates at time to his back and chest  3. ONSET: When did the pain begin? (Minutes, hours or days ago)      3 weeks ago  5. PATTERN Does the pain come and go, or is it constant?     Comes and goes  6. SEVERITY: How bad is the pain?  (e.g., Scale 1-10; mild, moderate, or severe)     Mild to moderate pain  7. RECURRENT SYMPTOM: Have you ever had this type of stomach pain before? If Yes, ask: When was the last time? and What happened that time?      Yes, he states he had worse symptoms before  9. RELIEVING/AGGRAVATING FACTORS: What makes it better or worse? (e.g., antacids, bending or twisting motion, bowel movement)     Eating anything causes symptoms  10. OTHER SYMPTOMS: Do you have any other symptoms? (e.g., back pain, diarrhea, fever, urination pain, vomiting)       Diarrhea and eye pain  Protocols used: Abdominal Pain - Male-A-AH

## 2024-07-07 NOTE — Telephone Encounter (Signed)
 Too soon for refill.  Requested Prescriptions  Pending Prescriptions Disp Refills   loperamide  (IMODIUM  A-D) 2 MG tablet 30 tablet 1    Sig: Take 1 tablet (2 mg total) by mouth 3 (three) times daily as needed for diarrhea or loose stools.     Over the Counter:  OTC Passed - 07/07/2024 10:33 AM      Passed - Valid encounter within last 12 months    Recent Outpatient Visits           1 month ago Hospital discharge follow-up   [place] [facility] - [facility] Dept Of [place]. [facility] [name], MD   2 months ago Gluteal abscess   [place] [facility] [place] - A Dept Of [place]. [facility] [name], MD   3 months ago Generalized abdominal pain   [place] [facility] [place] - A Dept Of [place]. [facility] Theotis Haze ORN, NP   4 months ago Gluteal abscess   [place] [facility] [place] - A Dept Of [place]. 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. [facility] Fleeta Tonia Garnette LITTIE, RPH-CPP       Future Appointments             In 2 weeks [name], MD [facility] [place] - A Dept Of [place]. [facility], [place]   In 2 months Loni Soyla LABOR, MD [facility] A Dept of The [place]. [facility], H&[facility]             Insulin  Pen Needle (PEN NEEDLES) 32G X 4 MM MISC 100 each 3    Sig: Use to inject Basaglar  once daily.     Endocrinology: Diabetes - Testing Supplies Passed - 07/07/2024 10:33 AM      Passed - Valid encounter within last 12 months    Recent Outpatient Visits           1 month ago Hospital discharge follow-up   [place] [facility] - A Dept Of [place]. [facility] [name], MD   2 months ago Gluteal abscess   [place] [facility] [place] - A Dept Of [place]. [facility] [name], MD   3 months ago Generalized abdominal pain   [place] [facility] [place] - A Dept Of [place]. [facility] Theotis Haze ORN, NP   4 months ago Gluteal abscess   [place] [facility] [place] - A Dept Of [place]. 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 [facility] Fleeta Tonia Garnette LITTIE, RPH-CPP       Future Appointments             In 2 weeks [name], MD [facility] [place] - A Dept Of [place]. [facility], [place]   In 2 months [place],  Soyla LABOR, MD [facility] at [facility] A Dept of The [employer]. [facility], H[facility]             glimepiride  (AMARYL ) 2 MG tablet 90 tablet 2    Sig: Take 1 tablet (2 mg total) by mouth daily before breakfast.     Endocrinology:  Diabetes - Sulfonylureas Failed - 07/07/2024 10:33 AM      Failed - HBA1C is between 0 and 7.9 and within 180 days    HbA1c, POC (controlled diabetic range)  Date Value Ref Range Status  05/04/2024 12.8 (A) 0.0 - 7.0 % Final         Failed - Cr in normal range and within 360 days    Creatinine, Ser  Date Value Ref Range Status  04/23/2024 0.46 (L) 0.61 - 1.24 mg/dL Final         Passed - Valid encounter within last 6 months    Recent Outpatient Visits           1 month ago Hospital discharge follow-up   [place] [facility] [place] - A Dept Of [place]. [facility] [name], MD   2 months ago Gluteal abscess   [place] [facility] [place] - A Dept Of [place]. [facility] [name], MD    3 months ago Generalized abdominal pain   [place] [facility] [place] - A Dept Of [place]. [facility] Theotis Haze ORN, NP   4 months ago Gluteal abscess   [place] [facility] [employer] - A Dept Of [place]. 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 [facility] Fleeta Tonia Garnette LITTIE, RPH-CPP       Future Appointments             In 2 weeks [name], MD [facility] [place] - A Dept Of [place]. [facility], [place]   In 2 months Loni Soyla LABOR, MD [facility] [employer] [facility] at [facility] A Dept of The [place]. [facility], H&V

## 2024-07-13 ENCOUNTER — Other Ambulatory Visit: Payer: Self-pay

## 2024-07-13 ENCOUNTER — Telehealth: Payer: Self-pay

## 2024-07-13 NOTE — Telephone Encounter (Signed)
 Copied from CRM #[id number]. Topic: Clinical - Prescription Issue >> Jul 13, 2024  2:29 PM [name] wrote: Reason for CRM:  Please call Drew [name] to let him know why  Encounter Date: 07/05/2024 Signed  Too soon for refill. Requested Prescriptions Pending Prescriptions Disp Refills  loperamide  (IMODIUM  A-D) 2 MG tablet 30 tablet This was filled on 05/14/24  Please call him at [phone] with Spanish Interpreter

## 2024-07-14 ENCOUNTER — Telehealth: Payer: Self-pay

## 2024-07-14 ENCOUNTER — Other Ambulatory Visit: Payer: Self-pay

## 2024-07-14 NOTE — Telephone Encounter (Signed)
**Note De-identified  [place] Obfuscation** Please advise 

## 2024-07-14 NOTE — Telephone Encounter (Signed)
 Still has RF left on Imodium . Contact pharmacy.

## 2024-07-14 NOTE — Telephone Encounter (Signed)
 Copied from CRM [id number]. Topic: Clinical - Medical Advice >> Jul 13, 2024  2:45 PM [name] wrote: Reason for CRM:  Drew [name] would like his vision checked. Please call him at [phone] to discuss where he can go in the [place] area.  Drew [name] would like for Dr. Vicci to call him in eye drops for itchy eyes, can't stand being in the light, headaches. Please call him to discuss at [phone]

## 2024-07-14 NOTE — Telephone Encounter (Signed)
 Copied from CRM [id number]. Topic: Clinical - Medical Advice >> Jul 13, 2024  2:45 PM Drew [name] wrote: Reason for CRM:  Drew [name] would like his vision checked. Please call him at [phone] to discuss where he can go in the [place] area.  Drew [name] would like for Dr. Vicci to call him in eye drops for itchy eyes, can't stand being in the light, headaches. Please call him to discuss at [phone]

## 2024-07-15 MED ORDER — NAPHAZOLINE-PHENIRAMINE 0.025-0.3 % OP SOLN
1.0000 [drp] | Freq: Two times a day (BID) | OPHTHALMIC | 0 refills | Status: AC | PRN
  Filled 2024-07-15: qty 15, 150d supply, fill #0

## 2024-07-15 NOTE — Addendum Note (Signed)
 Addended by: VICCI SOBER B on: 07/15/2024 11:40 PM   Modules accepted: Orders

## 2024-07-15 NOTE — Telephone Encounter (Signed)
 Prescription sent to our pharmacy for allergy eye drop. Pt does not have insurance. Can call and schedule eye appt with any eye doctor that he can afford to see.

## 2024-07-16 ENCOUNTER — Telehealth: Payer: Self-pay | Admitting: Internal Medicine

## 2024-07-16 ENCOUNTER — Other Ambulatory Visit: Payer: Self-pay | Admitting: Internal Medicine

## 2024-07-16 ENCOUNTER — Other Ambulatory Visit: Payer: Self-pay

## 2024-07-16 DIAGNOSIS — E785 Hyperlipidemia, unspecified: Secondary | ICD-10-CM

## 2024-07-16 DIAGNOSIS — E1059 Type 1 diabetes mellitus with other circulatory complications: Secondary | ICD-10-CM

## 2024-07-16 MED ORDER — AMLODIPINE BESYLATE 5 MG PO TABS
5.0000 mg | ORAL_TABLET | Freq: Every day | ORAL | 1 refills | Status: DC
  Filled 2024-07-16: qty 90, 90d supply, fill #0
  Filled 2024-10-12: qty 90, 90d supply, fill #1

## 2024-07-16 MED ORDER — ATORVASTATIN CALCIUM 20 MG PO TABS
20.0000 mg | ORAL_TABLET | Freq: Every day | ORAL | 1 refills | Status: DC
  Filled 2024-07-16: qty 90, 90d supply, fill #0
  Filled 2024-10-12: qty 90, 90d supply, fill #1

## 2024-07-16 NOTE — Telephone Encounter (Unsigned)
 Copied from CRM #[id number]. Topic: Clinical - Medication Refill >> Jul 16, 2024  3:11 PM Delon T wrote: Medication: Insulin  Glargine (BASAGLAR  KWIKPEN) 100 UNIT/ML glimepiride  (AMARYL ) 2 MG tablet  Has the patient contacted their pharmacy? No (Agent: If no, request that the patient contact the pharmacy for the refill. If patient does not wish to contact the pharmacy document the reason why and proceed with request.) (Agent: If yes, when and what did the pharmacy advise?)  This is the patient's preferred pharmacy:  [facility] [address] [place] [address] Phone: [phone] Fax: [phone]   Is this the correct pharmacy for this prescription? Yes If no, delete pharmacy and type the correct one.   Has the prescription been filled recently? Yes  Is the patient out of the medication? Yes  Has the patient been seen for an appointment in the last year OR does the patient have an upcoming appointment? Yes  Can we respond through MyChart? No  Agent: Please be advised that Rx refills may take up to 3 business days. We ask that you follow-up with your pharmacy.

## 2024-07-16 NOTE — Progress Notes (Deleted)
 07/16/2024 Drew [name] [id number] November 25, 1975  Referring provider: [name], MD Primary GI doctor: {[id number]}  ASSESSMENT AND PLAN:  Diarrhea and abdominal pain with history of perianal abscess   Diabetic retinopathy with clinical blindness  Coronary artery disease  Patient Care Team: [name], MD as PCP - General (Internal Medicine) Loni Soyla LABOR, MD as PCP - Cardiology (Cardiology)  HISTORY OF PRESENT ILLNESS: 48 y.o. self-pay Spanish-speaking male with a past medical history listed below presents for evaluation of ***.   Hospitalization 7/23 through 7/25 for left gluteal perianal abscess.  Status post I&D with Penrose drain placement by Dr. Vernetta 04/07/2024 discharged on 10-day Augmentin  presented again with acute diarrhea.  C. difficile negative discharged on Cipro  14-day.  Stool culture negative C. difficile negative FOBT negative Normocytic anemia with unremarkable iron 04/23/2024 03/23/2024 H. pylori breath test negative  04/07/2024 CTAP W  Diffuse left perirectal inflammatory changes along the left gluteal fold measuring 7.9 x 6.1 x 7.5 cm, without discrete fluid collection or fistulous tract. This likely reflects residual or recurrent infection 04/21/2024 CT pelvis W 1. A 5 x 1.3 cm left anterior perianal/perineal elongated possibly draining abscess/fistula. 2. Persistent mild anterior bladder wall thickening. Correlate with urinalysis. Consider outpatient urology.  *** Discussed the use of AI scribe software for clinical note transcription with the patient, who gave verbal consent to proceed.  History of Present Illness            He  reports that he has never smoked. He has never used smokeless tobacco. He reports that he does not drink alcohol and does not use drugs.  RELEVANT GI HISTORY, IMAGING AND LABS: Results          CBC    Component Value Date/Time   WBC 7.5 05/14/2024 1454   WBC 8.1 04/23/2024 0937    RBC 4.58 05/14/2024 1454   RBC 3.84 (L) 04/23/2024 0937   HGB 13.4 05/14/2024 1454   HCT 41.6 05/14/2024 1454   PLT 203 05/14/2024 1454   MCV 91 05/14/2024 1454   MCH 29.3 05/14/2024 1454   MCH 28.4 04/23/2024 0937   MCHC 32.2 05/14/2024 1454   MCHC 33.1 04/23/2024 0937   RDW 13.2 05/14/2024 1454   LYMPHSABS 2.6 04/21/2024 1357   MONOABS 0.6 04/21/2024 1357   EOSABS 0.2 04/21/2024 1357   BASOSABS 0.1 04/21/2024 1357   Recent Labs    04/07/24 0435 04/08/24 0329 04/09/24 0328 04/10/24 0359 04/11/24 0353 04/12/24 0330 04/21/24 1357 04/22/24 0336 04/23/24 0937 05/14/24 1454  HGB 12.1* 11.4* 11.0* 11.0* 11.1* 11.9* 11.6* 11.7* 10.9* 13.4    CMP     Component Value Date/Time   NA 139 04/23/2024 0937   NA 139 07/14/2023 1609   K 3.7 04/23/2024 0937   CL 107 04/23/2024 0937   CO2 24 04/23/2024 0937   GLUCOSE 240 (H) 04/23/2024 0937   BUN 13 04/23/2024 0937   BUN 20 07/14/2023 1609   CREATININE 0.46 (L) 04/23/2024 0937   CALCIUM  8.5 (L) 04/23/2024 0937   PROT 7.3 04/21/2024 1357   ALBUMIN 2.8 (L) 04/21/2024 1357   AST 31 04/21/2024 1357   ALT 54 (H) 04/21/2024 1357   ALKPHOS 191 (H) 04/21/2024 1357   BILITOT 0.8 04/21/2024 1357   GFRNONAA >60 04/23/2024 0937   GFRAA >60 07/31/2018 1904      Latest Ref Rng & Units 04/21/2024    1:57 PM 04/09/2024    3:28 AM 04/08/2024    3:29  AM  Hepatic Function  Total Protein 6.5 - 8.1 g/dL 7.3  5.8  5.9   Albumin 3.5 - 5.0 g/dL 2.8  2.1  2.1   AST 15 - 41 U/L 31  14  11    ALT 0 - 44 U/L 54  13  11   Alk Phosphatase 38 - 126 U/L 191  152  164   Total Bilirubin 0.0 - 1.2 mg/dL 0.8  1.0  1.4       Current Medications:   Current Outpatient Medications (Endocrine & Metabolic):    glimepiride  (AMARYL ) 2 MG tablet, Take 1 tablet (2 mg total) by mouth daily before breakfast.   Insulin  Glargine (BASAGLAR  KWIKPEN) 100 UNIT/ML, Inject 25 Units into the skin 2 (two) times daily.  Current Outpatient Medications (Cardiovascular):     amLODipine  (NORVASC ) 5 MG tablet, Take 1 tablet (5 mg total) by mouth daily.   atorvastatin  (LIPITOR) 20 MG tablet, Take 1 tablet (20 mg total) by mouth daily.   carvedilol  (COREG ) 3.125 MG tablet, Take 1 tablet (3.125 mg total) by mouth 2 (two) times daily with a meal.   Current Outpatient Medications (Analgesics):    ibuprofen  (ADVIL ) 600 MG tablet, Take 1 tablet (600 mg total) by mouth every 8 (eight) hours as needed. (Patient taking differently: Take 600 mg by mouth every 8 (eight) hours as needed for mild pain (pain score 1-3) or moderate pain (pain score 4-6).)   oxyCODONE  (OXY IR/ROXICODONE ) 5 MG immediate release tablet, Take 1 tablet (5 mg total) by mouth every 6 (six) hours as needed for moderate pain (pain score 4-6) or severe pain (pain score 7-10).   Current Outpatient Medications (Other):    gabapentin  (NEURONTIN ) 300 MG capsule, Take 1 capsule (300 mg total) by mouth 2 (two) times daily.   Insulin  Pen Needle (PEN NEEDLES) 32G X 4 MM MISC, Use to inject Basaglar  once daily.   Insulin  Syringe-Needle U-100 (TRUEPLUS INSULIN  SYRINGE) 31G X 5/16 0.5 ML MISC, Use to inject 70/30 insulin  twice daily.   loperamide  (IMODIUM  A-D) 2 MG tablet, Take 1 tablet (2 mg total) by mouth 3 (three) times daily as needed for diarrhea or loose stools.   miconazole  (MICATIN) 2 % cream, Apply 1 Application topically 2 (two) times daily.   naphazoline-pheniramine (NAPHCON-A) 0.025-0.3 % ophthalmic solution, Place 1 drop into both eyes 2 (two) times daily as needed for eye irritation or allergies.   nystatin  cream (MYCOSTATIN ), Apply topically 2 (two) times daily. Apply to penile area, apply after retracting foreskin  Medical History:  Past Medical History:  Diagnosis Date   Abscess, gluteal, left    Coronary artery disease    Diabetic retinopathy (HCC)    Hyperlipidemia associated with type 2 diabetes mellitus (HCC)    Hypertension    Type 2 diabetes mellitus (HCC)    Allergies: No Known Allergies    Surgical History:  He  has a past surgical history that includes Irrigation and debridement buttocks (N/A, 04/07/2024). Family History:  His family history is not on file.  REVIEW OF SYSTEMS  : All other systems reviewed and negative except where noted in the History of Present Illness.  PHYSICAL EXAM: There were no vitals taken for this visit. Physical Exam          Alan JONELLE Coombs, PA-C 4:24 PM

## 2024-07-19 ENCOUNTER — Ambulatory Visit: Payer: Self-pay | Admitting: Physician Assistant

## 2024-07-21 ENCOUNTER — Other Ambulatory Visit: Payer: Self-pay

## 2024-07-21 NOTE — Telephone Encounter (Signed)
 Called but no answer. Unable to LVM due to no VM.

## 2024-07-23 ENCOUNTER — Ambulatory Visit: Payer: Self-pay | Admitting: Internal Medicine

## 2024-07-30 ENCOUNTER — Other Ambulatory Visit: Payer: Self-pay

## 2024-08-09 ENCOUNTER — Other Ambulatory Visit: Payer: Self-pay

## 2024-08-13 ENCOUNTER — Other Ambulatory Visit: Payer: Self-pay | Admitting: Internal Medicine

## 2024-08-13 ENCOUNTER — Other Ambulatory Visit: Payer: Self-pay

## 2024-08-13 DIAGNOSIS — E113593 Type 2 diabetes mellitus with proliferative diabetic retinopathy without macular edema, bilateral: Secondary | ICD-10-CM

## 2024-08-15 MED ORDER — BASAGLAR KWIKPEN 100 UNIT/ML ~~LOC~~ SOPN
25.0000 [IU] | PEN_INJECTOR | Freq: Two times a day (BID) | SUBCUTANEOUS | 1 refills | Status: DC
  Filled 2024-09-08: qty 15, 30d supply, fill #0

## 2024-08-16 ENCOUNTER — Other Ambulatory Visit: Payer: Self-pay

## 2024-08-20 ENCOUNTER — Other Ambulatory Visit: Payer: Self-pay | Admitting: Internal Medicine

## 2024-08-20 ENCOUNTER — Ambulatory Visit: Payer: Self-pay

## 2024-08-20 DIAGNOSIS — R197 Diarrhea, unspecified: Secondary | ICD-10-CM

## 2024-08-20 DIAGNOSIS — E113593 Type 2 diabetes mellitus with proliferative diabetic retinopathy without macular edema, bilateral: Secondary | ICD-10-CM

## 2024-08-20 NOTE — Telephone Encounter (Signed)
 FYI Only or Action Required?: Action required by provider: medication refill request.  Patient was last seen in primary care on 05/14/2024 by [name], MD.  Called Nurse Triage reporting Abdominal Pain.  Symptoms began several weeks ago.  Interventions attempted: Nothing.  Symptoms are: gradually worsening.  Triage Disposition: See Physician Within 24 Hours  Patient/caregiver understands and will follow disposition?: Unsure         Copied from CRM #[id number]. Topic: Clinical - Red Word Triage >> Aug 20, 2024  1:48 PM Drew [name] wrote: Red Word that prompted transfer to Nurse Triage: stomach pain, pain located where he had surgery.. believes its infected.  Surgery in July. Reason for Disposition  [1] MILD pain (e.g., does not interfere with normal activities) AND [2] pain comes and goes (cramps) [3] present > 48 hours  (Exception: This same abdominal pain is a chronic symptom recurrent or ongoing AND present > 4 weeks.)  Answer Assessment - Initial Assessment Questions This RN spoke with the pt with use of an interpreter ID: [phone]. Patient not taking otc meds for symptoms. Pt requesting an appointment in office, no availability until January. Pt given information to be seen at [facility] [place] for symptoms. Requesting additional medication to be sent to pharmacy.   1. LOCATION: Where does it hurt?      Upper stomach  2. RADIATION: Does the pain shoot anywhere else? (e.g., chest, back)     Denies  3. ONSET: When did the pain begin? (Minutes, hours or days ago)      A few weeks ago  4. PATTERN Does the pain come and go, or is it constant?     Comes and goes increasing in frequency  5. SEVERITY: How bad is the pain?  (e.g., Scale 1-10; mild, moderate, or severe)     Mild  6. RECURRENT SYMPTOM: Have you ever had this type of stomach pain before? If Yes, ask: When was the last time? and What happened that time?      Denies  7.  RELIEVING/AGGRAVATING FACTORS: What makes it better or worse? (e.g., antacids, bending or twisting motion, bowel movement)     Sitting makes pain worst  8. OTHER SYMPTOMS: Do you have any other symptoms? (e.g., back pain, diarrhea, fever, urination pain, vomiting)       L hand  Protocols used: Abdominal Pain - Male-A-AH

## 2024-08-23 MED ORDER — LOPERAMIDE HCL 2 MG PO TABS: 2.0000 mg | ORAL_TABLET | Freq: Three times a day (TID) | ORAL | 0 refills | Status: DC | PRN

## 2024-08-23 MED ORDER — PEN NEEDLES 32G X 4 MM MISC: 0 refills | Status: AC

## 2024-08-23 NOTE — Telephone Encounter (Signed)
 Requested Prescriptions  Pending Prescriptions Disp Refills   loperamide  (IMODIUM  A-D) 2 MG tablet 270 tablet 0    Sig: Take 1 tablet (2 mg total) by mouth 3 (three) times daily as needed for diarrhea or loose stools.     Over the Counter:  OTC Passed - 08/23/2024  8:21 AM      Passed - Valid encounter within last 12 months    Recent Outpatient Visits           3 months ago Hospital discharge follow-up   [place] [facility] - A Dept Of [employer]. [facility] [name], MD   4 months ago Gluteal abscess   [place] [facility] [place] - A Dept Of [place]. [facility] [name], MD   5 months ago Generalized abdominal pain   [place] [facility] [place] - A Dept Of [place]. [facility] Theotis Haze ORN, NP   5 months ago Gluteal abscess   [place] [facility] [place] - A Dept Of [place]. 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 [facility] Fleeta Tonia Garnette LITTIE, RPH-CPP       Future Appointments             In 1 month Loni Soyla LABOR, MD [facility] at [facility] A Dept of [place]. [facility], H&V             Insulin  Glargine (BASAGLAR  KWIKPEN) 100 UNIT/ML 45 mL 1    Sig: Inject 25 Units into the skin 2 (two) times daily.     Endocrinology:  Diabetes - Insulins Failed - 08/23/2024  8:21 AM      Failed - HBA1C is between 0 and 7.9 and within 180 days    HbA1c, POC (controlled diabetic range)  Date Value Ref Range Status  05/04/2024 12.8 (A) 0.0 - 7.0 % Final         Passed - Valid encounter within last 6 months    Recent Outpatient Visits           3 months ago Hospital discharge follow-up   [place] [facility] [place]  - A Dept Of [place]. [facility] [name], MD   4 months ago Gluteal abscess   [place] [facility] [place] - A Dept Of [place]. [facility] [name], MD   5 months ago Generalized abdominal pain   [place] [facility] [place] - A Dept Of [place]. [facility] Theotis Haze ORN, NP   5 months ago Gluteal abscess   [place] [facility] [place] - A Dept Of [place]. 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 [facility] Fleeta Tonia Garnette LITTIE, RPH-CPP       Future Appointments             In 1 month Loni Soyla LABOR, MD [facility] Heart[facility] at [facility] A Dept  of [place]. [facility], H&V             Insulin  Pen Needle (PEN NEEDLES) 32G X 4 MM MISC 100 each 0    Sig: Use to inject Basaglar  once daily.     Endocrinology: Diabetes - Testing Supplies Passed - 08/23/2024  8:21 AM      Passed - Valid encounter within last 12 months    Recent Outpatient Visits           3 months ago Hospital discharge follow-up   [place] [facility] - A Dept Of [place]. [facility] [name], MD   4 months ago Gluteal abscess   [place] [facility] [place] - A Dept Of [place]. [facility] [name], MD   5 months ago Generalized abdominal pain   [place] [facility] [place] - A Dept Of [place]. [facility] Theotis Haze ORN, NP   5 months ago Gluteal abscess   [place] [facility] [place] - A Dept Of [place]. 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 [facility] Fleeta Tonia Garnette LITTIE, RPH-CPP       Future Appointments             In 1 month Loni Soyla LABOR, MD [facility] HeartCare at [facility] A Dept of [place]. [facility], H&V

## 2024-08-24 ENCOUNTER — Other Ambulatory Visit: Payer: Self-pay

## 2024-09-08 ENCOUNTER — Other Ambulatory Visit: Payer: Self-pay | Admitting: Internal Medicine

## 2024-09-08 ENCOUNTER — Other Ambulatory Visit: Payer: Self-pay

## 2024-09-08 DIAGNOSIS — R197 Diarrhea, unspecified: Secondary | ICD-10-CM

## 2024-09-09 ENCOUNTER — Other Ambulatory Visit: Payer: Self-pay

## 2024-09-09 MED ORDER — LOPERAMIDE HCL 2 MG PO TABS
2.0000 mg | ORAL_TABLET | Freq: Three times a day (TID) | ORAL | 1 refills | Status: AC | PRN
  Filled 2024-09-09: qty 24, 8d supply, fill #0

## 2024-09-16 ENCOUNTER — Telehealth: Payer: Self-pay | Admitting: Licensed Clinical Social Worker

## 2024-09-16 NOTE — Telephone Encounter (Signed)
 H&V Care Navigation CSW Progress Note  Clinical Social Worker contacted patient by phone to f/u on upcoming appt at [facility]. Note pt has moved to [place], may or may not be living with family. Per [facility] they have had a hard time getting in touch with him regularly. Attempted to call pt with assistance of Spanish language interpreter Earline [phone], no answer and number is out of service. Attempted again and same result. Pt previously did not keep in contact with his brother, will re-attempt him again as able.   Patient is participating in a Managed Medicaid Plan:  No, self pay only  SDOH Screenings   Food Insecurity: Food Insecurity Present (04/22/2024)  Housing: Unknown (05/13/2024)   [facility]  Recent Concern: Housing - High Risk (04/22/2024)  Transportation Needs: Unmet Transportation Needs (04/22/2024)  Utilities: At Risk (04/22/2024)  Depression (PHQ2-9): Low Risk (09/19/2023)  Financial Resource Strain: High Risk (01/28/2024)  Tobacco Use: Low Risk (05/14/2024)  Health Literacy: Inadequate Health Literacy (03/24/2024)    Marit Lark, MSW, LCSW Clinical Social Worker II [facility] Health Heart/Vascular Care Navigation  [phone]- work cell phone (preferred)

## 2024-09-21 ENCOUNTER — Telehealth: Payer: Self-pay | Admitting: Licensed Clinical Social Worker

## 2024-09-21 NOTE — Telephone Encounter (Signed)
**  LATE ENTRY** H&V Care Navigation CSW Progress Note  Clinical Social Worker contacted patient by phone to f/u on upcoming appt at [facility]. Note pt has moved to [place], may or may not be living with family. Per [facility]W they have had a hard time getting in touch with him regularly. Attempted to call pt with assistance of Spanish language interpreter [place], [phone], no answer and number is out of service. Attempted again and same result. Remain available should pt attend appt.   Patient is participating in a Managed Medicaid Plan:  No, CAFA 100%  SDOH Screenings   Food Insecurity: Food Insecurity Present (04/22/2024)  Housing: Unknown (05/13/2024)   [facility]  Recent Concern: Housing - High Risk (04/22/2024)  Transportation Needs: Unmet Transportation Needs (04/22/2024)  Utilities: At Risk (04/22/2024)  Depression (PHQ2-9): Low Risk (09/19/2023)  Financial Resource Strain: High Risk (01/28/2024)  Tobacco Use: Low Risk (05/14/2024)  Health Literacy: Inadequate Health Literacy (03/24/2024)

## 2024-09-22 ENCOUNTER — Ambulatory Visit: Payer: Self-pay | Attending: Cardiology | Admitting: Internal Medicine

## 2024-10-11 ENCOUNTER — Telehealth: Payer: Self-pay | Admitting: Licensed Clinical Social Worker

## 2024-10-11 NOTE — Telephone Encounter (Signed)
 H&V Care Navigation CSW Progress Note  Clinical Social Worker received a call from Kathleen with [employer] to share that pt is at their office today with his brother Herminio. He moved to [place], resides with brother and roommates. He is asking about assistance with eye dr appt. LCSW shared that PCP office had been working on that referral but with CAFA and [employer] Card options are limited   Patient is participating in a Managed Medicaid Plan:  No, self pay only, CAFA 100% thru 10/2024  SDOH Screenings   Food Insecurity: Food Insecurity Present (04/22/2024)  Housing: Unknown (05/13/2024)   [facility]  Recent Concern: Housing - High Risk (04/22/2024)  Transportation Needs: Unmet Transportation Needs (04/22/2024)  Utilities: At Risk (04/22/2024)  Depression (PHQ2-9): Low Risk (09/19/2023)  Financial Resource Strain: High Risk (01/28/2024)  Tobacco Use: Low Risk (05/14/2024)  Health Literacy: Inadequate Health Literacy (03/24/2024)    Marit Lark, MSW, LCSW Clinical Social Worker II [facility] Health Heart/Vascular Care Navigation  [phone]- work cell phone (preferred)

## 2024-10-12 ENCOUNTER — Other Ambulatory Visit: Payer: Self-pay

## 2024-10-13 ENCOUNTER — Emergency Department (HOSPITAL_COMMUNITY): Payer: MEDICAID

## 2024-10-13 ENCOUNTER — Inpatient Hospital Stay (HOSPITAL_COMMUNITY)
Admission: EM | Admit: 2024-10-13 | Discharge: 2024-10-17 | DRG: 357 | Disposition: A | Discharge status: Home / Self Care | Payer: MEDICAID | Attending: Internal Medicine | Admitting: Internal Medicine

## 2024-10-13 DIAGNOSIS — Z79899 Other long term (current) drug therapy: Secondary | ICD-10-CM

## 2024-10-13 DIAGNOSIS — E1069 Type 1 diabetes mellitus with other specified complication: Secondary | ICD-10-CM | POA: Diagnosis present

## 2024-10-13 DIAGNOSIS — Z5941 Food insecurity: Secondary | ICD-10-CM

## 2024-10-13 DIAGNOSIS — Z5948 Other specified lack of adequate food: Secondary | ICD-10-CM

## 2024-10-13 DIAGNOSIS — E7849 Other hyperlipidemia: Secondary | ICD-10-CM | POA: Diagnosis present

## 2024-10-13 DIAGNOSIS — Z59819 Housing instability, housed unspecified: Secondary | ICD-10-CM

## 2024-10-13 DIAGNOSIS — Z7984 Long term (current) use of oral hypoglycemic drugs: Secondary | ICD-10-CM

## 2024-10-13 DIAGNOSIS — E1039 Type 1 diabetes mellitus with other diabetic ophthalmic complication: Secondary | ICD-10-CM | POA: Diagnosis present

## 2024-10-13 DIAGNOSIS — Z59868 Other specified financial insecurity: Secondary | ICD-10-CM

## 2024-10-13 DIAGNOSIS — Z5971 Insufficient health insurance coverage: Secondary | ICD-10-CM

## 2024-10-13 DIAGNOSIS — L0291 Cutaneous abscess, unspecified: Principal | ICD-10-CM

## 2024-10-13 DIAGNOSIS — K610 Anal abscess: Secondary | ICD-10-CM | POA: Diagnosis present

## 2024-10-13 DIAGNOSIS — K612 Anorectal abscess: Principal | ICD-10-CM | POA: Diagnosis present

## 2024-10-13 DIAGNOSIS — E7800 Pure hypercholesterolemia, unspecified: Secondary | ICD-10-CM | POA: Diagnosis present

## 2024-10-13 DIAGNOSIS — Z5989 Other problems related to housing and economic circumstances: Secondary | ICD-10-CM

## 2024-10-13 DIAGNOSIS — Z789 Other specified health status: Secondary | ICD-10-CM | POA: Insufficient documentation

## 2024-10-13 DIAGNOSIS — Z794 Long term (current) use of insulin: Secondary | ICD-10-CM

## 2024-10-13 DIAGNOSIS — E1142 Type 2 diabetes mellitus with diabetic polyneuropathy: Secondary | ICD-10-CM

## 2024-10-13 DIAGNOSIS — H548 Legal blindness, as defined in USA: Secondary | ICD-10-CM | POA: Diagnosis present

## 2024-10-13 DIAGNOSIS — E1159 Type 2 diabetes mellitus with other circulatory complications: Secondary | ICD-10-CM

## 2024-10-13 DIAGNOSIS — Z603 Acculturation difficulty: Secondary | ICD-10-CM | POA: Diagnosis present

## 2024-10-13 DIAGNOSIS — I10 Essential (primary) hypertension: Secondary | ICD-10-CM | POA: Diagnosis present

## 2024-10-13 DIAGNOSIS — E10319 Type 1 diabetes mellitus with unspecified diabetic retinopathy without macular edema: Secondary | ICD-10-CM | POA: Diagnosis present

## 2024-10-13 DIAGNOSIS — K5900 Constipation, unspecified: Secondary | ICD-10-CM | POA: Diagnosis present

## 2024-10-13 DIAGNOSIS — Z5982 Transportation insecurity: Secondary | ICD-10-CM

## 2024-10-13 DIAGNOSIS — E113593 Type 2 diabetes mellitus with proliferative diabetic retinopathy without macular edema, bilateral: Secondary | ICD-10-CM

## 2024-10-13 DIAGNOSIS — E11319 Type 2 diabetes mellitus with unspecified diabetic retinopathy without macular edema: Secondary | ICD-10-CM | POA: Diagnosis present

## 2024-10-13 DIAGNOSIS — L02215 Cutaneous abscess of perineum: Secondary | ICD-10-CM | POA: Diagnosis present

## 2024-10-13 DIAGNOSIS — I2510 Atherosclerotic heart disease of native coronary artery without angina pectoris: Secondary | ICD-10-CM | POA: Diagnosis present

## 2024-10-13 DIAGNOSIS — I252 Old myocardial infarction: Secondary | ICD-10-CM

## 2024-10-13 DIAGNOSIS — I152 Hypertension secondary to endocrine disorders: Secondary | ICD-10-CM

## 2024-10-13 DIAGNOSIS — E109 Type 1 diabetes mellitus without complications: Secondary | ICD-10-CM | POA: Diagnosis present

## 2024-10-13 DIAGNOSIS — E1139 Type 2 diabetes mellitus with other diabetic ophthalmic complication: Secondary | ICD-10-CM | POA: Insufficient documentation

## 2024-10-13 DIAGNOSIS — E785 Hyperlipidemia, unspecified: Secondary | ICD-10-CM | POA: Diagnosis present

## 2024-10-13 DIAGNOSIS — E1065 Type 1 diabetes mellitus with hyperglycemia: Secondary | ICD-10-CM | POA: Diagnosis not present

## 2024-10-13 DIAGNOSIS — H5440 Blindness, one eye, unspecified eye: Secondary | ICD-10-CM | POA: Diagnosis present

## 2024-10-13 LAB — I-STAT CHEM 8, ED
BUN: 25 mg/dL — ABNORMAL HIGH (ref 6–20)
Calcium, Ion: 1.16 mmol/L (ref 1.15–1.40)
Chloride: 102 mmol/L (ref 98–111)
Creatinine, Ser: 0.8 mg/dL (ref 0.61–1.24)
Glucose, Bld: 338 mg/dL — ABNORMAL HIGH (ref 70–99)
HCT: 43 % (ref 39.0–52.0)
Hemoglobin: 14.6 g/dL (ref 13.0–17.0)
Potassium: 4 mmol/L (ref 3.5–5.1)
Sodium: 136 mmol/L (ref 135–145)
TCO2: 23 mmol/L (ref 22–32)

## 2024-10-13 LAB — COMPREHENSIVE METABOLIC PANEL WITH GFR
ALT: 20 U/L (ref 0–44)
AST: 22 U/L (ref 15–41)
Albumin: 3.9 g/dL (ref 3.5–5.0)
Alkaline Phosphatase: 192 U/L — ABNORMAL HIGH (ref 38–126)
Anion gap: 11 (ref 5–15)
BUN: 23 mg/dL — ABNORMAL HIGH (ref 6–20)
CO2: 24 mmol/L (ref 22–32)
Calcium: 9.4 mg/dL (ref 8.9–10.3)
Chloride: 101 mmol/L (ref 98–111)
Creatinine, Ser: 0.84 mg/dL (ref 0.61–1.24)
GFR, Estimated: >60 mL/min
Glucose, Bld: 348 mg/dL — ABNORMAL HIGH (ref 70–99)
Potassium: 4 mmol/L (ref 3.5–5.1)
Sodium: 135 mmol/L (ref 135–145)
Total Bilirubin: 0.6 mg/dL (ref 0.0–1.2)
Total Protein: 8.3 g/dL — ABNORMAL HIGH (ref 6.5–8.1)

## 2024-10-13 LAB — CBC WITH DIFFERENTIAL/PLATELET
Abs Immature Granulocytes: 0.03 K/uL (ref 0.00–0.07)
Basophils Absolute: 0 K/uL (ref 0.0–0.1)
Basophils Relative: 0 %
Eosinophils Absolute: 0.1 K/uL (ref 0.0–0.5)
Eosinophils Relative: 2 %
HCT: 43.5 % (ref 39.0–52.0)
Hemoglobin: 14.9 g/dL (ref 13.0–17.0)
Immature Granulocytes: 0 %
Lymphocytes Relative: 27 %
Lymphs Abs: 2.2 K/uL (ref 0.7–4.0)
MCH: 28.7 pg (ref 26.0–34.0)
MCHC: 34.3 g/dL (ref 30.0–36.0)
MCV: 83.7 fL (ref 80.0–100.0)
Monocytes Absolute: 0.6 K/uL (ref 0.1–1.0)
Monocytes Relative: 7 %
Neutro Abs: 5.4 K/uL (ref 1.7–7.7)
Neutrophils Relative %: 64 %
Platelets: 240 K/uL (ref 150–400)
RBC: 5.2 MIL/uL (ref 4.22–5.81)
RDW: 12.6 % (ref 11.5–15.5)
WBC: 8.4 K/uL (ref 4.0–10.5)
nRBC: 0 % (ref 0.0–0.2)

## 2024-10-13 LAB — URINALYSIS, ROUTINE W REFLEX MICROSCOPIC
Bacteria, UA: NONE SEEN
Bilirubin Urine: NEGATIVE
Glucose, UA: >=500 mg/dL — AB
Ketones, ur: NEGATIVE mg/dL
Leukocytes,Ua: NEGATIVE
Nitrite: NEGATIVE
Protein, ur: >=300 mg/dL — AB
Specific Gravity, Urine: 1.044 — ABNORMAL HIGH (ref 1.005–1.030)
pH: 5 (ref 5.0–8.0)

## 2024-10-13 LAB — GLUCOSE, CAPILLARY: Glucose-Capillary: 295 mg/dL — ABNORMAL HIGH (ref 70–99)

## 2024-10-13 MED ORDER — ENOXAPARIN SODIUM 40 MG/0.4ML IJ SOSY
40.0000 mg | PREFILLED_SYRINGE | INTRAMUSCULAR | Status: DC
  Administered 2024-10-13 – 2024-10-16 (×4): 40 mg via SUBCUTANEOUS
  Filled 2024-10-13 (×4): qty 0.4

## 2024-10-13 MED ORDER — AMLODIPINE BESYLATE 5 MG PO TABS
5.0000 mg | ORAL_TABLET | Freq: Every day | ORAL | Status: DC
  Administered 2024-10-13 – 2024-10-17 (×4): 5 mg via ORAL
  Filled 2024-10-13 (×4): qty 1

## 2024-10-13 MED ORDER — CARVEDILOL 3.125 MG PO TABS
3.1250 mg | ORAL_TABLET | Freq: Two times a day (BID) | ORAL | Status: DC
  Administered 2024-10-14 – 2024-10-17 (×7): 3.125 mg via ORAL
  Filled 2024-10-13 (×7): qty 1

## 2024-10-13 MED ORDER — TRAMADOL HCL 50 MG PO TABS
50.0000 mg | ORAL_TABLET | Freq: Four times a day (QID) | ORAL | Status: DC | PRN
  Administered 2024-10-14: 50 mg via ORAL
  Filled 2024-10-13: qty 1

## 2024-10-13 MED ORDER — NAPHAZOLINE-PHENIRAMINE 0.025-0.3 % OP SOLN
1.0000 [drp] | Freq: Two times a day (BID) | OPHTHALMIC | Status: DC | PRN
  Administered 2024-10-13: 1 [drp] via OPHTHALMIC
  Filled 2024-10-13: qty 15

## 2024-10-13 MED ORDER — ONDANSETRON 4 MG PO TBDP: 4.0000 mg | ORAL_TABLET | Freq: Four times a day (QID) | ORAL | Status: DC | PRN

## 2024-10-13 MED ORDER — GABAPENTIN 300 MG PO CAPS
300.0000 mg | ORAL_CAPSULE | Freq: Two times a day (BID) | ORAL | Status: DC
  Administered 2024-10-13 – 2024-10-17 (×7): 300 mg via ORAL
  Filled 2024-10-13 (×7): qty 1

## 2024-10-13 MED ORDER — IOHEXOL 300 MG/ML  SOLN
100.0000 mL | Freq: Once | INTRAMUSCULAR | Status: AC | PRN
  Administered 2024-10-13: 100 mL via INTRAVENOUS

## 2024-10-13 MED ORDER — OXYCODONE HCL 5 MG PO TABS
5.0000 mg | ORAL_TABLET | ORAL | Status: DC | PRN
  Administered 2024-10-14 – 2024-10-16 (×4): 10 mg via ORAL
  Administered 2024-10-16: 5 mg via ORAL
  Administered 2024-10-16 – 2024-10-17 (×4): 10 mg via ORAL
  Filled 2024-10-13: qty 1
  Filled 2024-10-13 (×8): qty 2

## 2024-10-13 MED ORDER — PIPERACILLIN-TAZOBACTAM 3.375 G IVPB
3.3750 g | Freq: Three times a day (TID) | INTRAVENOUS | Status: DC
  Administered 2024-10-13 – 2024-10-17 (×11): 3.375 g via INTRAVENOUS
  Filled 2024-10-13 (×11): qty 50

## 2024-10-13 MED ORDER — INSULIN ASPART 100 UNIT/ML IJ SOLN
0.0000 [IU] | INTRAMUSCULAR | Status: DC
  Administered 2024-10-13 – 2024-10-14 (×2): 8 [IU] via SUBCUTANEOUS
  Administered 2024-10-14: 2 [IU] via SUBCUTANEOUS
  Filled 2024-10-13: qty 2
  Filled 2024-10-13 (×2): qty 8

## 2024-10-13 MED ORDER — POTASSIUM CHLORIDE IN NACL 20-0.9 MEQ/L-% IV SOLN
INTRAVENOUS | Status: AC
  Filled 2024-10-13 (×2): qty 1000

## 2024-10-13 MED ORDER — ONDANSETRON HCL 4 MG/2ML IJ SOLN: 4.0000 mg | Freq: Four times a day (QID) | INTRAMUSCULAR | Status: DC | PRN

## 2024-10-13 MED ORDER — HYDROMORPHONE HCL 1 MG/ML IJ SOLN
1.0000 mg | INTRAMUSCULAR | Status: DC | PRN
  Administered 2024-10-15 (×2): 1 mg via INTRAVENOUS
  Filled 2024-10-13 (×2): qty 1

## 2024-10-13 NOTE — ED Provider Notes (Signed)
 " [place] EMERGENCY DEPARTMENT AT [facility] Provider Note   CSN: [id number] Arrival date & time: 10/13/24  1247     Patient presents with: Wound Check   Drew [name] is a 49 y.o. male.   49 year old male with prior medical history as detailed below presents for evaluation.  Patient was referred from the surgical clinic for CT imaging.  Spanish translator utilized for interview.  Patient with several days of increasing pain to his left perineum.  Surgery clinic was concern for possible abscess.  He was sent to the ED for CT imaging.    The history is provided by the patient and medical records.       Prior to Admission medications  Medication Sig Start Date End Date Taking? Authorizing Provider  amLODipine  (NORVASC ) 5 MG tablet Take 1 tablet (5 mg total) by mouth daily. 07/16/24 01/17/25  [name], MD  atorvastatin  (LIPITOR) 20 MG tablet Take 1 tablet (20 mg total) by mouth daily. 07/16/24 01/17/25  [name], MD  carvedilol  (COREG ) 3.125 MG tablet Take 1 tablet (3.125 mg total) by mouth 2 (two) times daily with a meal. 01/27/24 11/18/24  [name], MD  gabapentin  (NEURONTIN ) 300 MG capsule Take 1 capsule (300 mg total) by mouth 2 (two) times daily. 05/14/24   [name], MD  glimepiride  (AMARYL ) 2 MG tablet Take 1 tablet (2 mg total) by mouth daily before breakfast. 05/14/24   [name], MD  ibuprofen  (ADVIL ) 600 MG tablet Take 1 tablet (600 mg total) by mouth every 8 (eight) hours as needed. Patient taking differently: Take 600 mg by mouth every 8 (eight) hours as needed for mild pain (pain score 1-3) or moderate pain (pain score 4-6). 03/01/24   Newlin, Enobong, MD  Insulin  Glargine (BASAGLAR  KWIKPEN) 100 UNIT/ML Inject 25 Units into the skin 2 (two) times daily. 08/15/24   [name], MD  Insulin  Pen Needle (PEN NEEDLES) 32G X 4 MM MISC Use to inject Basaglar  once daily. 08/23/24   [name], MD   Insulin  Syringe-Needle U-100 (TRUEPLUS INSULIN  SYRINGE) 31G X 5/16 0.5 ML MISC Use to inject 70/30 insulin  twice daily. 09/19/23   [name], MD  loperamide  (IMODIUM  A-D) 2 MG tablet Take 1 tablet (2 mg total) by mouth 3 (three) times daily as needed for diarrhea or loose stools. 09/09/24   [name], MD  miconazole  (MICATIN) 2 % cream Apply 1 Application topically 2 (two) times daily. 12/05/23   [name], MD  naphazoline-pheniramine (NAPHCON-A) 0.025-0.3 % ophthalmic solution Place 1 drop into both eyes 2 (two) times daily as needed for eye irritation or allergies. 07/15/24   [name], MD  nystatin  cream (MYCOSTATIN ) Apply topically 2 (two) times daily. Apply to penile area, apply after retracting foreskin 04/12/24   Tammy Sor, PA-C  oxyCODONE  (OXY IR/ROXICODONE ) 5 MG immediate release tablet Take 1 tablet (5 mg total) by mouth every 6 (six) hours as needed for moderate pain (pain score 4-6) or severe pain (pain score 7-10). 04/12/24   Tammy Sor, PA-C    Allergies: Patient has no known allergies.    Review of Systems  All other systems reviewed and are negative.   Updated Vital Signs BP (!) 130/90 (BP Location: Right Arm)   Pulse (!) 107   Resp 20   SpO2 100%   Physical Exam Vitals and nursing note reviewed.  Constitutional:      General: He is  not in acute distress.    Appearance: Normal appearance. He is well-developed.  HENT:     Head: Normocephalic and atraumatic.     Mouth/Throat:     Mouth: Mucous membranes are moist.  Eyes:     Extraocular Movements: Extraocular movements intact.     Conjunctiva/sclera: Conjunctivae normal.     Pupils: Pupils are equal, round, and reactive to light.  Cardiovascular:     Rate and Rhythm: Normal rate and regular rhythm.     Heart sounds: Normal heart sounds.  Pulmonary:     Effort: Pulmonary effort is normal. No respiratory distress.     Breath sounds: Normal breath sounds.  Abdominal:      General: Abdomen is flat. There is no distension.     Palpations: Abdomen is soft.     Tenderness: There is no abdominal tenderness.  Genitourinary:    Comments: Tender at the left perineum. Musculoskeletal:        General: No deformity. Normal range of motion.     Cervical back: Normal range of motion and neck supple.  Skin:    General: Skin is warm and dry.  Neurological:     General: No focal deficit present.     Mental Status: He is alert and oriented to person, place, and time. Mental status is at baseline.     (all labs ordered are listed, but only abnormal results are displayed) Labs Reviewed  COMPREHENSIVE METABOLIC PANEL WITH GFR - Abnormal; Notable for the following components:      Result Value   Glucose, Bld 348 (*)    BUN 23 (*)    Total Protein 8.3 (*)    Alkaline Phosphatase 192 (*)    All other components within normal limits  I-STAT CHEM 8, ED - Abnormal; Notable for the following components:   BUN 25 (*)    Glucose, Bld 338 (*)    All other components within normal limits  CBC WITH DIFFERENTIAL/PLATELET  URINALYSIS, ROUTINE W REFLEX MICROSCOPIC    EKG: None  Radiology: CT ABDOMEN PELVIS W CONTRAST Result Date: 10/13/2024 CLINICAL DATA:  Acute left-sided abdominal pain EXAM: CT ABDOMEN AND PELVIS WITH CONTRAST TECHNIQUE: Multidetector CT imaging of the abdomen and pelvis was performed using the standard protocol following bolus administration of intravenous contrast. RADIATION DOSE REDUCTION: This exam was performed according to the departmental dose-optimization program which includes automated exposure control, adjustment of the mA and/or kV according to patient size and/or use of iterative reconstruction technique. CONTRAST:  OMNIPAQUE  IOHEXOL  300 MG/ML  SOLN COMPARISON:  04/07/2024, 04/21/2024 FINDINGS: Lower chest: No acute pleural or parenchymal lung disease. Hepatobiliary: Diffuse hepatic steatosis. No focal liver abnormality. The gallbladder is  unremarkable. Pancreas: Unremarkable. No pancreatic ductal dilatation or surrounding inflammatory changes. Spleen: Normal in size without focal abnormality. Adrenals/Urinary Tract: Adrenal glands are unremarkable. Kidneys are normal, without renal calculi, focal lesion, or hydronephrosis. Bladder is unremarkable. Stomach/Bowel: No bowel obstruction or ileus. Normal appendix right lower quadrant. No bowel wall thickening or inflammatory change. Vascular/Lymphatic: Aortic atherosclerosis. No enlarged abdominal or pelvic lymph nodes. Reproductive: Prostate is unremarkable. Other: No free fluid or free intraperitoneal gas. No abdominal wall hernia. Musculoskeletal: There are inflammatory changes within the subcutaneous fat in the left gluteal region, extending into the left-sided perineum. A small rim enhancing fluid collection is seen anteriorly in the left perineum, measuring 1.7 x 1.8 cm. 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 No acute or destructive bony abnormalities. Reconstructed images demonstrate no additional findings.  IMPRESSION: 1. Inflammatory changes within the left medial gluteal cleft and extending into the left side perineum, with small rim enhancing fluid collection as above. 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 This had previously measured 5.4 x 1.1 cm. 2.  Aortic Atherosclerosis (ICD10-I70.0). 3. Hepatic steatosis. Electronically Signed   By: Ozell Daring M.D.   On: 10/13/2024 15:03     Procedures   Medications Ordered in the ED  iohexol  (OMNIPAQUE ) 300 MG/ML solution 100 mL (100 mLs Intravenous Contrast Given 10/13/24 1433)                                    Medical Decision Making Patient sent from surgical clinic for evaluation.  CT imaging requested per surgery.  CT suggest possible recurrent perineal abscess.  Surgery Jan) is aware of case and will evaluate for admission.  Risk Decision  regarding hospitalization.        Final diagnoses:  Abscess    ED Discharge Orders     None          Laurice Maude BROCKS, MD 10/13/24 2057  "

## 2024-10-13 NOTE — ED Triage Notes (Signed)
 Sent here for further eval/imaging - has been experiencing pain around the groin/testicular/left leg after surgery. Surgery was for an abscess.

## 2024-10-13 NOTE — ED Notes (Addendum)
 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 Pt instructed on how to use call bell

## 2024-10-13 NOTE — ED Provider Triage Note (Signed)
 Emergency Medicine Provider Triage Evaluation Note  Drew [name] , a 49 y.o. male  was evaluated in triage.  Pt complains of perineal mass.  Patient appears to have perineal mass that was incised and drained by general surgery several months ago and reportedly has had worsening of symptoms with what he believes to be some drainage present.  He is blind so he is unable to tell what exactly has been draining out but does feel that something is drained.  He states that he may have had some fever off and on last several days with denies any obvious feelings of generalized weakness, fatigue or bodyaches.  He was seen by general surgery this morning and advised to come into the emergency department for evaluation with CT imaging of the pelvis for further assessment of this perineal abscess.  Review of Systems  Positive: As above Negative: As above  Physical Exam  BP (!) 130/90 (BP Location: Right Arm)   Pulse (!) 107   Resp 20   SpO2 100%  Gen:   Awake, no distress   Resp:  Normal effort MSK:   Moves extremities without difficulty  Other:  Exam is limited due to positioning in the triage room.  Based on note review, there is concerns for a 2 cm wound that is almost completely healed without surrounding erythema or drainage with some mild tenderness palpation with no fluctuance or swelling from the note from the general surgery PA that saw the patient earlier today.  Medical Decision Making  Medically screening exam initiated at 1:19 PM.  Appropriate orders placed.  Drew [name] was informed that the remainder of the evaluation will be completed by another provider, this initial triage assessment does not replace that evaluation, and the importance of remaining in the ED until their evaluation is complete.    Marshawn Ninneman A, PA-C 10/13/24 1321

## 2024-10-13 NOTE — ED Notes (Signed)
Called for vitals no response

## 2024-10-13 NOTE — H&P (Signed)
 ADMISSION H AND P   PROVIDER:  TONJA ALECK SHAPER, PA   MRN: [id number] DOB: 10-17-1975 DATE OF ENCOUNTER: 10/13/2024 Interval History:    Drew [name] is a 49 y.o. 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 Vernetta.  Culture revealed pansensitive E. coli.  Discharged on 04/12/2024 with 5 days Augmentin  after [place] drain removal.  He presented to the ER on 04/21/2024 with increased pain at the I&D site.  At that time, there was some macerated necrotic tissue and purulent drainage with erythema.  He was admitted but there was no obvious indication for another procedure.  He was discharged on 04/23/2024 with Cipro .   He was last seen in August 2025 for a wound check.  At that time the area was slightly painful and he was having diarrhea, likely from antibiotics.  He states over the past month a new lump appeared at the base of his scrotum and it has become larger and painful recently.  He is having difficulty sitting on the area.  He states he occasionally has chills but denies any obvious fevers.   [employer] interpre[employer] was used for this encounter.   Review of Systems:    ROS All other systems reviewed and are negative.   Medications:          Current Outpatient Medications on File Prior to Visit  Medication Sig Dispense Refill   atorvastatin  (LIPITOR) 20 MG tablet Take 20 mg by mouth once daily       gabapentin  (NEURONTIN ) 300 MG capsule Take 300 mg by mouth at bedtime       ibuprofen  (MOTRIN ) 600 MG tablet Take 600 mg by mouth       insulin  GLARGINE (BASAGLAR  KWIKPEN U-100 INSULIN ) pen injector (concentration 100 units/mL) Inject 25 Units subcutaneously       loperamide  (IMODIUM  A-D) 2 mg tablet Take 2 mg by mouth       amLODIPine  (NORVASC ) 5 MG tablet Take 5 mg by mouth once daily        No current facility-administered medications on file prior to visit.      Physical Examination:    BP 137/82   Pulse 85   Temp 36.6 C  (97.8 F)   Ht 165.1 cm (5' 5)   Wt 59.5 kg (131 lb 3.2 oz)   BMI 21.83 kg/m    General: Well-developed, well-nourished, in no acute distress.     Left anterior perianal region: There are 3 punctate openings on the left anterior perianal region with a small amount of thin drainage.     In the anterior perineum close to the base of the scrotum, there is a vague fullness that is tender to palpation.  No overlying erythema or skin changes   A chaperone, Drew [name], CMA, was present during the exam.       Assessment and Plan:    Diagnoses and all orders for this visit:   Perineal mass in male       Drew [name] is status post incision and drainage perianal abscess on 04/07/2024 by Dr. Vernetta.  On exam, there are several punctate openings in the left anterior perianal region, likely at the previous scar.  A few centimeters anterior to this region in the perineum at the base of the scrotum, there is an area of fullness that is tender to palpation.  There are no overlying skin changes.  I asked Dr. Stevie to also evaluate the patient and  he agrees that the patient needs a CT scan to evaluate for fluid collection.  However, he does not have a working telephone number and it would be difficult to schedule the CT and then to discuss the CT findings/interventions, so I think it would be best to have the CT scan done in the ER and have the surgery/urology teams available if needed.  He agrees.  Addendum:  A CT of the abdomen and pelvis has been performed showing inflammatory changes within the left gluteal and perineal area with a fluid collection seen anteriorly in the perineum measuring 1.7 x 1.8 cm.  The patient's small and large bowel appeared normal with no inflammatory changes or thickening.  On further questioning of the patient with an interpreter, he continues to have some vague abdominal pain as well as persistent diarrhea.  He has not seen a gastroenterologist regarding this.   Currently, he will likely need an examination under anesthesia with drainage of the abscess and further evaluation whether there are any fistulas present or any intra anal pathology that could be causing his persistent infections.  He will be admitted for IV antibiotics and IV rehydration.  I have ordered a sliding scale for his diabetes.  We may need to have the hospitalist evaluate the patient as well.  He also may need gastroenterology to see him to consider a workup for inflammatory bowel disease.  The patient is also requesting an ophthalmology evaluation if possible in the hospital but I am not sure that can be arranged this admission. I will diet until midnight and then he will be n.p.o. and I will discuss his case with our colorectal surgeon who is our acute care surgeon this week for further plans.  The patient, through an interpreter, agrees with the plans  Moderately complex medical decision making  [name], MD [facility], a Division of [facility]

## 2024-10-13 NOTE — Progress Notes (Signed)
 "  PROVIDER:  PUJA GOSAI MACZIS, PA  MRN: [id number] DOB: 11/12/1975 DATE OF ENCOUNTER: 10/13/2024 Interval History:   Drew [name] is a 49 y.o. 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 Vernetta.  Culture revealed pansensitive E. coli.  Discharged on 04/12/2024 with 5 days Augmentin  after Penrose drain removal.  He presented to the ER on 04/21/2024 with increased pain at the I&D site.  At that time, there was some macerated necrotic tissue and purulent drainage with erythema.  He was admitted but there was no obvious indication for another procedure.  He was discharged on 04/23/2024 with Cipro .  He was last seen in August 2025 for a wound check.  At that time the area was slightly painful and he was having diarrhea, likely from antibiotics.  He states over the past month a new lump appeared at the base of his scrotum and it has become larger and painful recently.  He is having difficulty sitting on the area.  He states he occasionally has chills but denies any obvious fevers.  [employer] was used for this encounter.  Review of Systems:   ROS All other systems reviewed and are negative.  Medications:   Current Outpatient Medications on File Prior to Visit  Medication Sig Dispense Refill   atorvastatin  (LIPITOR) 20 MG tablet Take 20 mg by mouth once daily     gabapentin  (NEURONTIN ) 300 MG capsule Take 300 mg by mouth at bedtime     ibuprofen  (MOTRIN ) 600 MG tablet Take 600 mg by mouth     insulin  GLARGINE (BASAGLAR  KWIKPEN U-100 INSULIN ) pen injector (concentration 100 units/mL) Inject 25 Units subcutaneously     loperamide  (IMODIUM  A-D) 2 mg tablet Take 2 mg by mouth     amLODIPine  (NORVASC ) 5 MG tablet Take 5 mg by mouth once daily     No current facility-administered medications on file prior to visit.    Physical Examination:   BP 137/82   Pulse 85   Temp 36.6 C (97.8 F)   Ht 165.1 cm (5' 5)   Wt 59.5 kg  (131 lb 3.2 oz)   BMI 21.83 kg/m   General: Well-developed, well-nourished, in no acute distress.    Left anterior perianal region: There are 3 punctate openings on the left anterior perianal region with a small amount of thin drainage.    In the anterior perineum close to the base of the scrotum, there is a vague fullness that is tender to palpation.  No overlying erythema or skin changes  A chaperone, Jordyn Sharpe, CMA, was present during the exam.    Assessment and Plan:   Diagnoses and all orders for this visit:  Perineal mass in male     Thos Matsumoto is status post incision and drainage perianal abscess on 04/07/2024 by Dr. Vernetta.  On exam, there are several punctate openings in the left anterior perianal region, likely at the previous scar.  A few centimeters anterior to this region in the perineum at the base of the scrotum, there is an area of fullness that is tender to palpation.  There are no overlying skin changes.  I asked Dr. Stevie to also evaluate the patient and he agrees that the patient needs a CT scan to evaluate for fluid collection.  However, he does not have a working telephone number and it would be difficult to schedule the CT and then to discuss the CT findings/interventions, so I think it would  be best to have the CT scan done in the ER and have the surgery/urology teams available if needed.  He agrees.   Puja Maczis, [facility] "

## 2024-10-14 ENCOUNTER — Encounter (HOSPITAL_COMMUNITY): Payer: Self-pay | Admitting: Surgery

## 2024-10-14 ENCOUNTER — Encounter (HOSPITAL_COMMUNITY)
Admission: EM | Disposition: A | Discharge status: Home / Self Care | Payer: Self-pay | Source: Home / Self Care | Attending: Internal Medicine

## 2024-10-14 ENCOUNTER — Inpatient Hospital Stay (HOSPITAL_COMMUNITY): Payer: Self-pay | Admitting: Anesthesiology

## 2024-10-14 ENCOUNTER — Other Ambulatory Visit: Payer: Self-pay

## 2024-10-14 DIAGNOSIS — E785 Hyperlipidemia, unspecified: Secondary | ICD-10-CM

## 2024-10-14 DIAGNOSIS — I10 Essential (primary) hypertension: Secondary | ICD-10-CM

## 2024-10-14 DIAGNOSIS — E1069 Type 1 diabetes mellitus with other specified complication: Secondary | ICD-10-CM

## 2024-10-14 DIAGNOSIS — H548 Legal blindness, as defined in USA: Secondary | ICD-10-CM

## 2024-10-14 DIAGNOSIS — E1139 Type 2 diabetes mellitus with other diabetic ophthalmic complication: Secondary | ICD-10-CM | POA: Insufficient documentation

## 2024-10-14 DIAGNOSIS — K611 Rectal abscess: Secondary | ICD-10-CM

## 2024-10-14 DIAGNOSIS — Z789 Other specified health status: Secondary | ICD-10-CM

## 2024-10-14 DIAGNOSIS — I2510 Atherosclerotic heart disease of native coronary artery without angina pectoris: Secondary | ICD-10-CM

## 2024-10-14 DIAGNOSIS — L02215 Cutaneous abscess of perineum: Secondary | ICD-10-CM

## 2024-10-14 DIAGNOSIS — E119 Type 2 diabetes mellitus without complications: Secondary | ICD-10-CM

## 2024-10-14 HISTORY — PX: RECTAL EXAM UNDER ANESTHESIA: SHX6399

## 2024-10-14 LAB — BASIC METABOLIC PANEL WITH GFR
Anion gap: 7 (ref 5–15)
BUN: 20 mg/dL (ref 6–20)
CO2: 27 mmol/L (ref 22–32)
Calcium: 9.2 mg/dL (ref 8.9–10.3)
Chloride: 104 mmol/L (ref 98–111)
Creatinine, Ser: 0.82 mg/dL (ref 0.61–1.24)
GFR, Estimated: >60 mL/min
Glucose, Bld: 264 mg/dL — ABNORMAL HIGH (ref 70–99)
Potassium: 3.6 mmol/L (ref 3.5–5.1)
Sodium: 138 mmol/L (ref 135–145)

## 2024-10-14 LAB — HEMOGLOBIN A1C
Hgb A1c MFr Bld: 11.3 % — ABNORMAL HIGH (ref 4.8–5.6)
Mean Plasma Glucose: 277.61 mg/dL

## 2024-10-14 LAB — CBC
HCT: 38.8 % — ABNORMAL LOW (ref 39.0–52.0)
Hemoglobin: 13.7 g/dL (ref 13.0–17.0)
MCH: 29.1 pg (ref 26.0–34.0)
MCHC: 35.3 g/dL (ref 30.0–36.0)
MCV: 82.4 fL (ref 80.0–100.0)
Platelets: 214 K/uL (ref 150–400)
RBC: 4.71 MIL/uL (ref 4.22–5.81)
RDW: 12.4 % (ref 11.5–15.5)
WBC: 6.2 K/uL (ref 4.0–10.5)
nRBC: 0 % (ref 0.0–0.2)

## 2024-10-14 LAB — GLUCOSE, CAPILLARY
Glucose-Capillary: 114 mg/dL — ABNORMAL HIGH (ref 70–99)
Glucose-Capillary: 130 mg/dL — ABNORMAL HIGH (ref 70–99)
Glucose-Capillary: 142 mg/dL — ABNORMAL HIGH (ref 70–99)
Glucose-Capillary: 184 mg/dL — ABNORMAL HIGH (ref 70–99)
Glucose-Capillary: 205 mg/dL — ABNORMAL HIGH (ref 70–99)
Glucose-Capillary: 228 mg/dL — ABNORMAL HIGH (ref 70–99)
Glucose-Capillary: 252 mg/dL — ABNORMAL HIGH (ref 70–99)

## 2024-10-14 LAB — HIV ANTIBODY (ROUTINE TESTING W REFLEX): HIV Screen 4th Generation wRfx: NONREACTIVE

## 2024-10-14 MED ORDER — METHYLENE BLUE 20 MG/2ML IV SOSY
PREFILLED_SYRINGE | INTRAVENOUS | Status: AC
  Filled 2024-10-14: qty 2

## 2024-10-14 MED ORDER — BISACODYL 10 MG RE SUPP: 10.0000 mg | Freq: Two times a day (BID) | RECTAL | Status: DC | PRN

## 2024-10-14 MED ORDER — OXYCODONE HCL 5 MG/5ML PO SOLN: 5.0000 mg | Freq: Once | ORAL | Status: AC | PRN

## 2024-10-14 MED ORDER — CHLORHEXIDINE GLUCONATE CLOTH 2 % EX PADS
6.0000 | MEDICATED_PAD | Freq: Once | CUTANEOUS | Status: AC
  Administered 2024-10-14: 6 via TOPICAL

## 2024-10-14 MED ORDER — LACTATED RINGERS IV BOLUS: 1000.0000 mL | Freq: Three times a day (TID) | INTRAVENOUS | Status: AC | PRN

## 2024-10-14 MED ORDER — SODIUM CHLORIDE 0.9% FLUSH: 3.0000 mL | INTRAVENOUS | Status: DC | PRN

## 2024-10-14 MED ORDER — DROPERIDOL 2.5 MG/ML IJ SOLN: 0.6250 mg | Freq: Once | INTRAMUSCULAR | Status: DC | PRN

## 2024-10-14 MED ORDER — SODIUM CHLORIDE 0.9% FLUSH: 3.0000 mL | Freq: Two times a day (BID) | INTRAVENOUS | Status: DC

## 2024-10-14 MED ORDER — METHYLENE BLUE 20 MG/2ML IV SOSY
PREFILLED_SYRINGE | INTRAVENOUS | Status: DC | PRN
  Administered 2024-10-14: 1 mL

## 2024-10-14 MED ORDER — INSULIN GLARGINE 100 UNIT/ML ~~LOC~~ SOLN
25.0000 [IU] | Freq: Two times a day (BID) | SUBCUTANEOUS | Status: DC
  Administered 2024-10-14 – 2024-10-17 (×6): 25 [IU] via SUBCUTANEOUS
  Filled 2024-10-14 (×7): qty 0.25

## 2024-10-14 MED ORDER — MENTHOL 3 MG MT LOZG: 1.0000 | LOZENGE | OROMUCOSAL | Status: DC | PRN

## 2024-10-14 MED ORDER — FENTANYL CITRATE (PF) 50 MCG/ML IJ SOSY
PREFILLED_SYRINGE | INTRAMUSCULAR | Status: AC
  Filled 2024-10-14: qty 2

## 2024-10-14 MED ORDER — GABAPENTIN 300 MG PO CAPS
300.0000 mg | ORAL_CAPSULE | ORAL | Status: AC
  Administered 2024-10-14: 300 mg via ORAL
  Filled 2024-10-14: qty 1

## 2024-10-14 MED ORDER — ACETAMINOPHEN 325 MG PO TABS: 325.0000 mg | ORAL_TABLET | Freq: Four times a day (QID) | ORAL | Status: DC | PRN

## 2024-10-14 MED ORDER — INSULIN GLARGINE 100 UNIT/ML ~~LOC~~ SOLN
25.0000 [IU] | Freq: Every day | SUBCUTANEOUS | Status: DC
  Filled 2024-10-14: qty 0.25

## 2024-10-14 MED ORDER — PROPOFOL 10 MG/ML IV BOLUS
INTRAVENOUS | Status: AC
  Filled 2024-10-14: qty 20

## 2024-10-14 MED ORDER — PHENOL 1.4 % MT LIQD: 2.0000 | OROMUCOSAL | Status: DC | PRN

## 2024-10-14 MED ORDER — SODIUM CHLORIDE 0.9 % IV SOLN: INTRAVENOUS | Status: AC

## 2024-10-14 MED ORDER — OXYCODONE HCL 5 MG PO TABS
ORAL_TABLET | ORAL | Status: AC
  Filled 2024-10-14: qty 1

## 2024-10-14 MED ORDER — SODIUM CHLORIDE 0.9 % IV SOLN: 250.0000 mL | INTRAVENOUS | Status: DC | PRN

## 2024-10-14 MED ORDER — BUPIVACAINE-EPINEPHRINE (PF) 0.25% -1:200000 IJ SOLN
INTRAMUSCULAR | Status: AC
  Filled 2024-10-14: qty 60

## 2024-10-14 MED ORDER — BUPIVACAINE LIPOSOME 1.3 % IJ SUSP
20.0000 mL | INTRAMUSCULAR | Status: AC
  Filled 2024-10-14: qty 20

## 2024-10-14 MED ORDER — ACETAMINOPHEN 500 MG PO TABS
1000.0000 mg | ORAL_TABLET | ORAL | Status: AC
  Administered 2024-10-14: 1000 mg via ORAL
  Filled 2024-10-14: qty 2

## 2024-10-14 MED ORDER — ROCURONIUM BROMIDE 10 MG/ML (PF) SYRINGE
PREFILLED_SYRINGE | INTRAVENOUS | Status: AC
  Filled 2024-10-14: qty 10

## 2024-10-14 MED ORDER — SODIUM CHLORIDE 0.9 % IV SOLN
2.0000 g | INTRAVENOUS | Status: AC
  Administered 2024-10-14: 2 g via INTRAVENOUS
  Filled 2024-10-14: qty 20

## 2024-10-14 MED ORDER — FENTANYL CITRATE (PF) 100 MCG/2ML IJ SOLN
INTRAMUSCULAR | Status: DC | PRN
  Administered 2024-10-14 (×2): 50 ug via INTRAVENOUS

## 2024-10-14 MED ORDER — BUPIVACAINE-EPINEPHRINE 0.25% -1:200000 IJ SOLN
INTRAMUSCULAR | Status: DC | PRN
  Administered 2024-10-14: 30 mL

## 2024-10-14 MED ORDER — FENTANYL CITRATE (PF) 100 MCG/2ML IJ SOLN
INTRAMUSCULAR | Status: AC
  Filled 2024-10-14: qty 2

## 2024-10-14 MED ORDER — ALUM & MAG HYDROXIDE-SIMETH 200-200-20 MG/5ML PO SUSP: 30.0000 mL | Freq: Four times a day (QID) | ORAL | Status: DC | PRN

## 2024-10-14 MED ORDER — SALINE SPRAY 0.65 % NA SOLN: 1.0000 | Freq: Four times a day (QID) | NASAL | Status: DC | PRN

## 2024-10-14 MED ORDER — PHENYLEPHRINE 80 MCG/ML (10ML) SYRINGE FOR IV PUSH (FOR BLOOD PRESSURE SUPPORT)
PREFILLED_SYRINGE | INTRAVENOUS | Status: AC
  Filled 2024-10-14: qty 10

## 2024-10-14 MED ORDER — INSULIN ASPART 100 UNIT/ML IJ SOLN
0.0000 [IU] | Freq: Every day | INTRAMUSCULAR | Status: DC
  Administered 2024-10-14 – 2024-10-15 (×2): 2 [IU] via SUBCUTANEOUS
  Administered 2024-10-16: 3 [IU] via SUBCUTANEOUS
  Filled 2024-10-14: qty 2
  Filled 2024-10-14: qty 3
  Filled 2024-10-14: qty 2

## 2024-10-14 MED ORDER — FENTANYL CITRATE (PF) 50 MCG/ML IJ SOSY
25.0000 ug | PREFILLED_SYRINGE | INTRAMUSCULAR | Status: DC | PRN
  Administered 2024-10-14 (×3): 50 ug via INTRAVENOUS

## 2024-10-14 MED ORDER — OXYCODONE HCL 5 MG PO TABS
5.0000 mg | ORAL_TABLET | Freq: Once | ORAL | Status: AC | PRN
  Administered 2024-10-14: 5 mg via ORAL

## 2024-10-14 MED ORDER — PHENYLEPHRINE 80 MCG/ML (10ML) SYRINGE FOR IV PUSH (FOR BLOOD PRESSURE SUPPORT)
PREFILLED_SYRINGE | INTRAVENOUS | Status: DC | PRN
  Administered 2024-10-14: 160 ug via INTRAVENOUS
  Administered 2024-10-14: 80 ug via INTRAVENOUS
  Administered 2024-10-14: 160 ug via INTRAVENOUS

## 2024-10-14 MED ORDER — SODIUM CHLORIDE 0.9 % IR SOLN
Status: DC | PRN
  Administered 2024-10-14: 1000 mL

## 2024-10-14 MED ORDER — INSULIN ASPART 100 UNIT/ML IJ SOLN
0.0000 [IU] | INTRAMUSCULAR | Status: DC | PRN
  Administered 2024-10-14: 4 [IU] via SUBCUTANEOUS
  Filled 2024-10-14: qty 4

## 2024-10-14 MED ORDER — INSULIN ASPART 100 UNIT/ML IJ SOLN
0.0000 [IU] | Freq: Three times a day (TID) | INTRAMUSCULAR | Status: DC
  Administered 2024-10-14: 5 [IU] via SUBCUTANEOUS
  Administered 2024-10-15: 2 [IU] via SUBCUTANEOUS
  Administered 2024-10-15 – 2024-10-16 (×4): 3 [IU] via SUBCUTANEOUS
  Administered 2024-10-17: 8 [IU] via SUBCUTANEOUS
  Filled 2024-10-14: qty 3
  Filled 2024-10-14: qty 8
  Filled 2024-10-14: qty 3
  Filled 2024-10-14 (×2): qty 2
  Filled 2024-10-14: qty 1
  Filled 2024-10-14: qty 3
  Filled 2024-10-14: qty 5

## 2024-10-14 MED ORDER — CHLORHEXIDINE GLUCONATE 0.12 % MT SOLN
15.0000 mL | Freq: Once | OROMUCOSAL | Status: AC
  Administered 2024-10-14: 15 mL via OROMUCOSAL

## 2024-10-14 MED ORDER — SIMETHICONE 40 MG/0.6ML PO SUSP: 80.0000 mg | Freq: Four times a day (QID) | ORAL | Status: DC | PRN

## 2024-10-14 MED ORDER — MAGIC MOUTHWASH
15.0000 mL | Freq: Four times a day (QID) | ORAL | Status: DC | PRN
  Administered 2024-10-14: 15 mL via ORAL
  Filled 2024-10-14 (×2): qty 15

## 2024-10-14 MED ORDER — LIDOCAINE HCL (CARDIAC) PF 100 MG/5ML IV SOSY
PREFILLED_SYRINGE | INTRAVENOUS | Status: DC | PRN
  Administered 2024-10-14: 60 mg via INTRAVENOUS

## 2024-10-14 MED ORDER — LIDOCAINE HCL (PF) 2 % IJ SOLN
INTRAMUSCULAR | Status: AC
  Filled 2024-10-14: qty 5

## 2024-10-14 MED ORDER — CALCIUM POLYCARBOPHIL 625 MG PO TABS
625.0000 mg | ORAL_TABLET | Freq: Two times a day (BID) | ORAL | Status: DC
  Administered 2024-10-14 – 2024-10-17 (×6): 625 mg via ORAL
  Filled 2024-10-14 (×7): qty 1

## 2024-10-14 MED ORDER — LACTATED RINGERS IV SOLN: INTRAVENOUS | Status: AC

## 2024-10-14 MED ORDER — PROPOFOL 10 MG/ML IV BOLUS
INTRAVENOUS | Status: DC | PRN
  Administered 2024-10-14: 170 mg via INTRAVENOUS

## 2024-10-14 MED ORDER — DIBUCAINE (PERIANAL) 1 % EX OINT
TOPICAL_OINTMENT | CUTANEOUS | Status: AC
  Filled 2024-10-14: qty 28

## 2024-10-14 MED ORDER — FENTANYL CITRATE (PF) 50 MCG/ML IJ SOSY
PREFILLED_SYRINGE | INTRAMUSCULAR | Status: AC
  Filled 2024-10-14: qty 1

## 2024-10-14 NOTE — Inpatient Diabetes Management (Signed)
 Inpatient Diabetes Program Recommendations  AACE/ADA: New Consensus Statement on Inpatient Glycemic Control (2015)  Target Ranges:  Prepandial:   less than 140 mg/dL      Peak postprandial:   less than 180 mg/dL (1-2 hours)      Critically ill patients:  140 - 180 mg/dL   Lab Results  Component Value Date   GLUCAP 205 (H) 10/14/2024   HGBA1C 11.3 (H) 10/14/2024    Review of Glycemic Control  Diabetes history: DM2 Outpatient Diabetes medications: Amaryl  2 mg daily, Basaglar  25 BID Current orders for Inpatient glycemic control: Lantus  25 BID, Novolog  0-15 TID with meals and 0-5 HS  HgbA1C 11.3% PCP - Dr Vicci at [facility] - Last OV 05/14/2024  Spoke with pt on 04/22/2024 regarding his diabetes and HgbA1C of > 15%.  Inpatient Diabetes Program Recommendations:    May need small amount of Novolog  meal coverage if post-prandials > 180 mg/dL.  Will f/u with pt in am. Check to see if pt using the [employer] Voice Blood glucose meter for monitoring.  Thank you. Shona Brandy, RD, LDN, CDCES Inpatient Diabetes Coordinator [phone]

## 2024-10-14 NOTE — Transfer of Care (Signed)
 Immediate Anesthesia Transfer of Care Note  Patient: Drew [name]  Procedure(s) Performed: EXAM UNDER ANESTHESIA, RECTUM  Patient Location: PACU  Anesthesia Type:General  Level of Consciousness: awake, alert , and oriented  Airway & Oxygen Therapy: Patient Spontanous Breathing  Post-op Assessment: Report given to RN and Post -op Vital signs reviewed and stable  Post vital signs: Reviewed and stable  Last Vitals:  Vitals Value Taken Time  BP 155/84 10/14/24 13:52  Temp 36.5 C 10/14/24 13:52  Pulse 96 10/14/24 14:00  Resp 14 10/14/24 14:00  SpO2 98 % 10/14/24 14:00  Vitals shown include unfiled device data.  Last Pain:  Vitals:   10/14/24 1352  TempSrc: Oral  PainSc:       Patients Stated Pain Goal: 5 (10/14/24 1046)  Complications: No notable events documented.

## 2024-10-14 NOTE — Anesthesia Procedure Notes (Signed)
 Procedure Name: LMA Insertion Date/Time: 10/14/2024 12:54 PM  Performed by: Corran Lalone, Corean BROCKS, CRNAPre-anesthesia Checklist: Patient identified, Emergency Drugs available, Suction available and Patient being monitored Patient Re-evaluated:Patient Re-evaluated prior to induction Oxygen Delivery Method: Circle system utilized Preoxygenation: Pre-oxygenation with 100% oxygen Induction Type: IV induction Ventilation: Mask ventilation without difficulty LMA: LMA inserted LMA Size: 4.0 Number of attempts: 1 Airway Equipment and Method: Bite block Placement Confirmation: positive ETCO2 Tube secured with: Tape Dental Injury: Teeth and Oropharynx as per pre-operative assessment

## 2024-10-14 NOTE — Discharge Instructions (Signed)
 CIRUGA ANORRECTAL: INSTRUCCIONES POSTERIORES A LA OPERACIN  ################################################################################################################################################################################################################################################################### ####################  COMER Comience con una dieta lquida completa o en pur Despus de 24 horas, realice una transicin gradual a una Regulatory affairs officer.  CONTROL DEL DOLOR Controle el dolor para que pueda tolerar las deposiciones. caminar, dormir, tolerar los estornudos / toser y Environmental manager / Architectural technologist.   TENGA UN MOVIMIENTO INTESTINAL DIARIAMENTE Mantenga sus intestinos regulares para evitar problemas. Tomar un suplemento de [employer] para [employer] intestinos [place]. Pruebe un laxante para combatir el estreimiento. Utilice un antidiarreico para Social research officer, government. Llame si no mejora despus de 2 intentos  CAMINAR [place] una hora al da. Controle su dolor para hacer eso.   LLAME SI TIENE PROBLEMAS / INQUIETUDES Llame si todava tiene dificultades a pesar de seguir estas instrucciones. Llame si tiene inquietudes que no responden a estas instrucciones  ################################################################################################################################################################################################################################################################### ####################    Tome sus medicamentos caseros recetados habitualmente a menos que se le indique lo contrario.  DIETA: Siga una dieta ligera y blanda y lquidos las primeras 24 horas despus de llegar a casa, como sopa, lquidos, almidones, etc. Asegrese de beber muchos lquidos. Avance rpidamente a una dieta slida habitual en unos [employer]. Evite la comida rpida o las comidas pesadas, ya que es ms probable que sienta nuseas o  tenga intestinos irregulares. Una dieta baja en grasas y alta en fibra por el resto de su vida es ideal.  CONTROL DE DOLOR: El dolor se controla mejor mediante una combinacin habitual de tres mtodos diferentes JUNTOS: Hielo / Calor Analgsicos de venta libre Analgsicos recetados Espere hinchazn y Dentist en el rea del ano / recto. Los baos de agua tibia (de 30 a 60 minutos hasta 6 veces al da, especialmente despus de los movimientos intestinales) ayudarn. Use hielo durante los primeros das para ayudar a disminuir la hinchazn y los moretones, luego cambie a Airline pilot como toallas tibias, baos de [place], baos tibios, etc. para ayudar a relajar los puntos tensos / doloridos y Hydrologist. Algunas Youth worker solo, solo calor, alternando entre hielo y Airline pilot. Experimente con lo que le funcione. Es til Careers adviser un analgsico de venta libre de forma continua durante las primeras semanas. Elija uno de los siguientes que funcione mejor para usted: Naproxeno (Aleve, etc.) Dos tabletas de 220 mg [employer] al da Ibuprofeno (Advil, etc.) Tres tabletas de 200 mg cuatro veces al da (cada comida y antes de Teacher, music) Acetaminofn (Tylenol, etc.) 500-650 mg cuatro veces al da (cada comida y antes de Teacher, music) Se le debe dar una receta para analgsicos (como oxicodona, hidrocodona, etc.) al momento del alta. Tome sus analgsicos segn lo prescrito. Si tiene problemas / inquietudes con el medicamento recetado (no controla el dolor, las nuseas, los vmitos, el sarpullido, la picazn, etc.), llmenos al [phone] para ver si necesitamos cambiarlo a otro medicamento para Chief Technology Officer. que funcionar mejor para usted y / o Chief Operating Officer mejor sus efectos secundarios. Si necesita reabastecer su medicamento para el dolor, comunquese con su farmacia. Ellos se comunicarn con nuestra oficina para solicitar autorizacin. Las recetas no se surtirn despus de las 5 pm o los fines de  [place]. Puede tomar hasta 48 horas para que se llene y est listo, as que evite esperar hasta que est listo para tomar la ltima pldora. Se le puede dar una crema tpica (Dibucaine) o una receta para una crema (como diltiazem 2% gel). Muchas personas encuentran alivio con las cremas tpicas. A  algunas personas les [employer]. Experimentar. Si te ayuda, salo. Si se quema, no lo use.  Use un bao de asiento 4-8 veces al da para [address] de asiento Un bao de asiento es un bao de agua tibia que se toma en la posicin sentada y que cubre solo las caderas y las nalgas. Puede utilizarse con fines curativos o higinicos. Los baos de asiento tambin se [place] para Engineer, materials, la picazn o los espasmos musculares. El agua puede contener medicamentos. El calor hmedo te ayudar a curarte y relajarte. INSTRUCCIONES DE CUIDADO EN EL HOGAR Tome de 3 a 4 baos de asiento [employer]. Llene la baera hasta la mitad con agua tibia. Sintese en el agua y abra un poco el desage. Abra el agua tibia para Photographer tina medio llena. Mantenga el agua corriendo constantemente. Remojar en el agua durante 15 a 20 minutos. Despus del bao de asiento, seque primero el rea afectada con palmaditas.   MANTENGA SUS INTESTINOS REGULARES El objetivo es una evacuacin intestinal blanda al da. Evite el estreimiento. Entre la [place] y los analgsicos, es comn experimentar algo de estreimiento. Aumentar la ingesta de lquidos y tomar un suplemento de fibra (como Metamucil, [place], [place], [place], etc.) 2-3 veces al da con regularidad generalmente ayudar a prevenir que ocurra este problema. Se debe tomar un laxante suave (jugo de [place], [place] de magnesia, [place], etc.) de acuerdo con las instrucciones del paquete si no hay evacuaciones intestinales despus de 48 horas. Cuidado con la diarrea. Si tiene muchas deposiciones blandas, simplifique su dieta a alimentos y lquidos blandos [address] Detenga los ablandadores de heces y [place] su suplemento de [place]. Cambiar a medicamentos antidiarreicos leves (Kayopectate, Pepto Bismol) puede ayudar. Puede probar una dosis de imodio / loperamida. Si esto empeora o no mejora, por favor llmenos.  Cuidado de [employer] vendajes con su primera evacuacin intestinal, generalmente el da despus de la ciruga. Es posible que tenga un taponamiento si tuvo un absceso. Deje que se salga cualquier empaque o gasa que caiga. Use una almohadilla absorbente o bolas de algodn suave en su ropa interior segn sea necesario para atrapar cualquier drenaje y ayudar a Pharmacologist el rea Mantenga el rea limpia y Helper. Bese / dchese [employer]. Mantenga el rea limpia duchndose / bandose sobre la incisin / herida. Est bien remojar una herida abierta para ayudar a lavarla. Considere usar una botella exprimible llena de agua tibia para lavar suavemente el rea anal. Las toallitas hmedas o las duchas / el lavado suave despus de defecar suelen ser menos traumticas que el papel higinico normal. A menudo notar sangrado con las deposiciones. Esto debera disminuir al final de la primera semana de la [place]. Sentarse sobre una bolsa de hielo puede ayudar. Espere algo de drenaje. Esto debera disminuir al final de la primera semana de la ciruga, pero tendr sangrado o supuracin ocasional hasta unos meses despus de la ciruga. Use una gasa absorbente o de algodn [place] en su ropa interior hasta que deje de supurar.  ACTIVIDADES segn lo tolerado: Puede reanudar las actividades diarias regulares (ligeras) a Glass blower/designer del da siguiente, como el cuidado personal diario, Advertising account planner, subir escaleras, aumentando gradualmente las actividades segn las [place]. Si puede caminar 30 minutos sin dificultad, es seguro probar actividades ms intensas como trotar, andar en cinta, andar en bicicleta, aerbicos de bajo impacto, nadar, etc. Guarde la actividad ms  intensa y extenuante para el final, como abdominales,  levantar objetos pesados, deportes de contacto, etc. Abstenerse de levantar objetos pesados o hacer esfuerzos hasta que deje de tomar narcticos para Human resources officer. NO EMPUJE EL DOLOR. Deje que el dolor sea su gua: si le duele hacer algo, no lo haga. El dolor es su cuerpo que le advierte que evite esa actividad durante otra semana hasta que el dolor disminuya. Puede conducir cuando ya no est tomando analgsicos recetados, puede sentarse cmodamente durante largos perodos de tiempo y Geophysical data processor con seguridad su automvil y Contractor los frenos. Puede tener relaciones sexuales cuando sea cmodo. SEGUIMIENTO en nuestra oficina Llame a CCS al [phone] para programar una cita para ver a su cirujano en el consultorio para una cita de seguimiento aproximadamente 2-3 semanas despus de su ciruga. Asegrese de llamar para esta cita el da que llegue a casa para asegurar una hora conveniente para la cita.  8. SI USTED TIENE DISCAPACIDAD O FAMILIA DEJE LOS FORMULARIOS, LLEVELOS A LA OFICINA PARA SU PROCESAMIENTO. NO SE LOS D A SU MDICO.        CUNDO LLAMARNOS [phone]: Mal control del dolor Reacciones / problemas con nuevos medicamentos (sarpullido / picazn, nuseas, etc.) Fiebre superior a 101,5 F (38,5 C) Incapacidad para orinar Nuseas y / o vmitos Empeoramiento de la hinchazn o hematomas Sangrado continuo de la incisin. Mayor [employer], enrojecimiento o supuracin de la incisin  El personal de la clnica est disponible para responder sus preguntas durante el horario laboral habitual (de 8:30 a. M. A 5 p. M.). No dude en llamar y pedir hablar con una de nuestras enfermeras si tiene inquietudes clnicas. [facility] siempre est disponible en los hospitales.  Si tiene [employer], vaya a la sala de emergencias ms cercana o llame al 911.   [place],  [place] [address], [place], [place] [address]? PRINCIPAL: [phone]? LLAMADA GRATUITA: [phone]? FAX [phone] [email or web address]

## 2024-10-14 NOTE — Anesthesia Preprocedure Evaluation (Signed)
"                                    Anesthesia Evaluation  Patient identified by MRN, date of birth, ID band Patient awake    Reviewed: Allergy & Precautions, NPO status , Patient's Chart, lab work & pertinent test results  History of Anesthesia Complications Negative for: history of anesthetic complications  Airway Mallampati: II  TM Distance: >3 FB Neck ROM: Full    Dental  (+) Missing,    Pulmonary neg pulmonary ROS   Pulmonary exam normal        Cardiovascular hypertension, Pt. on medications + CAD and + Past MI  Normal cardiovascular exam     Neuro/Psych  Headaches    GI/Hepatic Neg liver ROS,,,Perianal abscess   Endo/Other  diabetes, Type 2, Insulin  Dependent, Oral Hypoglycemic Agents    Renal/GU negative Renal ROS     Musculoskeletal negative musculoskeletal ROS (+)    Abdominal   Peds  Hematology negative hematology ROS (+)   Anesthesia Other Findings   Reproductive/Obstetrics                              Anesthesia Physical Anesthesia Plan  ASA: 3  Anesthesia Plan: General   Post-op Pain Management: Tylenol  PO (pre-op)* and Toradol  IV (intra-op)*   Induction: Intravenous  PONV Risk Score and Plan: 3 and Treatment may vary due to age or medical condition, Ondansetron , Dexamethasone  and Midazolam   Airway Management Planned: Oral ETT  Additional Equipment: None  Intra-op Plan:   Post-operative Plan: Extubation in OR  Informed Consent: I have reviewed the patients History and Physical, chart, labs and discussed the procedure including the risks, benefits and alternatives for the proposed anesthesia with the patient or authorized representative who has indicated his/her understanding and acceptance.     Dental advisory given and Interpreter used for interview  Plan Discussed with: CRNA  Anesthesia Plan Comments:          Anesthesia Quick Evaluation  "

## 2024-10-14 NOTE — Plan of Care (Signed)
   Problem: Education: Goal: Knowledge of General Education information will improve Description Including pain rating scale, medication(s)/side effects and non-pharmacologic comfort measures Outcome: Progressing

## 2024-10-14 NOTE — Consult Note (Addendum)
 Initial Consultation Note   Patient: Drew [name] FMW:[id number] DOB: [birth year] PCP: [name], MD DOA: 10/13/2024 DOS: the patient was seen and examined on 10/14/2024 Primary service: Kristopher, Md, MD  Referring physician: Dr. Sheldon Reason for consult: Diabetes management  Assessment/Plan: Drew [name] is a 49 y.o. male with past medical history of type 2 diabetes mellitus, hyperlipidemia, hypertension, CAD, gluteal abscess, perineal abscess who is admitted to general surgery service for perirectal abscess I&D. [facility] consulted for uncontrolled diabetes management.  Plan: Perirectal abscess: Status post I&D by surgery team. Continue Zosyn  therapy. Continue pain control, wound care per surgery team. Continue constipation regimen.  Uncontrolled type 1 diabetes mellitus: A1c 11.3. Blood sugars stable as he is NPO. Will start Semglee  25u Q12. Continue Accu-Cheks, sliding scale insulin . Discussed with him the need for tight blood sugar control for wound healing. Diabetes educator consulted. Carb consistent diet ordered.  Diabetic neuropathy, retinopathy- States she has bilateral leg pain, tingling, numbness, balance issues. He does have blindness, unable to take care of himself for the wound. Brother unable to help him.  Patient will need TOC evaluation for home health services.  Hypertension- Resume Coreg , Norvasc .  Hyperlipidemia- Resume statin upon discharge.  [facility] will be primary and take over the care of the patient.   HPI: Drew [name] is a 49 y.o. male with past medical history of type 2 diabetes mellitus, hyperlipidemia, hypertension, CAD, gluteal abscess, perineal abscess who is admitted to general surgery service for perirectal abscess I&D.  Patient had the procedure done today.  Patient had similar procedure 04/07/2024, cultures grew pansensitive E. coli, discharged on Augmentin  therapy.  Patient states that his swelling on the base of the scrotum started  growing, unable to sit presented to the ED.  RN at bedside helped translating.  Patient lives with his brother, unable to see due to his diabetes, unable to take care of himself.  Patient is originally from [place], moved recently.  Denies any fever, chills or rigors.  States he does not want to leave hospital early due to multiple prior surgeries and poor wound healing.  Patient does have uncontrolled type 2 diabetes mellitus and hospitalist service consulted for diabetes management and take over his care, surgery team will continue to follow him.  Review of Systems: As mentioned in the history of present illness. All other systems reviewed and are negative. Past Medical History:  Diagnosis Date   Abscess, gluteal, left    Coronary artery disease    Diabetic retinopathy (HCC)    Hyperlipidemia associated with type 2 diabetes mellitus (HCC)    Hypertension    Type 2 diabetes mellitus (HCC)    Past Surgical History:  Procedure Laterality Date   IRRIGATION AND DEBRIDEMENT BUTTOCKS N/A 04/07/2024   Procedure: IRRIGATION AND DEBRIDEMENT BUTTOCKS;  Surgeon: Vernetta Berg, MD;  Location: [facility];  Service: General;  Laterality: N/A;   Social History:  reports that he has never smoked. He has been exposed to tobacco smoke. He has never used smokeless tobacco. He reports that he does not drink alcohol and does not use drugs.  Allergies[1]  History reviewed. No pertinent family history.  Prior to Admission medications  Medication Sig Start Date End Date Taking? Authorizing Provider  amLODipine  (NORVASC ) 5 MG tablet Take 1 tablet (5 mg total) by mouth daily. 07/16/24 01/17/25  [name], MD  atorvastatin  (LIPITOR) 20 MG tablet Take 1 tablet (20 mg total) by mouth daily. 07/16/24 01/17/25  [name], MD  carvedilol  (COREG )  3.125 MG tablet Take 1 tablet (3.125 mg total) by mouth 2 (two) times daily with a meal. 01/27/24 11/18/24  [name], MD  gabapentin  (NEURONTIN ) 300 MG  capsule Take 1 capsule (300 mg total) by mouth 2 (two) times daily. 05/14/24   [name], MD  glimepiride  (AMARYL ) 2 MG tablet Take 1 tablet (2 mg total) by mouth daily before breakfast. 05/14/24   [name], MD  ibuprofen  (ADVIL ) 600 MG tablet Take 1 tablet (600 mg total) by mouth every 8 (eight) hours as needed. Patient taking differently: Take 600 mg by mouth every 8 (eight) hours as needed for mild pain (pain score 1-3) or moderate pain (pain score 4-6). 03/01/24   Newlin, Enobong, MD  Insulin  Glargine (BASAGLAR  KWIKPEN) 100 UNIT/ML Inject 25 Units into the skin 2 (two) times daily. 08/15/24   [name], MD  Insulin  Pen Needle (PEN NEEDLES) 32G X 4 MM MISC Use to inject Basaglar  once daily. 08/23/24   [name], MD  Insulin  Syringe-Needle U-100 (TRUEPLUS INSULIN  SYRINGE) 31G X 5/16 0.5 ML MISC Use to inject 70/30 insulin  twice daily. 09/19/23   [name], MD  loperamide  (IMODIUM  A-D) 2 MG tablet Take 1 tablet (2 mg total) by mouth 3 (three) times daily as needed for diarrhea or loose stools. 09/09/24   [name], MD  miconazole  (MICATIN) 2 % cream Apply 1 Application topically 2 (two) times daily. 12/05/23   [name], MD  naphazoline-pheniramine (NAPHCON-A) 0.025-0.3 % ophthalmic solution Place 1 drop into both eyes 2 (two) times daily as needed for eye irritation or allergies. 07/15/24   [name], MD  nystatin  cream (MYCOSTATIN ) Apply topically 2 (two) times daily. Apply to penile area, apply after retracting foreskin 04/12/24   Tammy Sor, PA-C  oxyCODONE  (OXY IR/ROXICODONE ) 5 MG immediate release tablet Take 1 tablet (5 mg total) by mouth every 6 (six) hours as needed for moderate pain (pain score 4-6) or severe pain (pain score 7-10). 04/12/24   Tammy Sor, PA-C    Physical Exam: Vitals:   10/14/24 1415 10/14/24 1430 10/14/24 1445 10/14/24 1458  BP: 125/83 125/82 126/82 132/84  Pulse: 91 90 91 92  Resp: 16 11 11 16    Temp:    (!) 97.5 F (36.4 C)  TempSrc:    Temporal  SpO2: 99% 93% 95% 99%  Weight:      Height:       General - Middle aged Hispanic male, no apparent distress HEENT - PERRLA, EOMI, atraumatic head, non tender sinuses. Lung - Clear, no rales, rhonchi, wheezes. Heart - S1, S2 heard, no murmurs, rubs, trace pedal edema. Abdomen - Soft, non tender, bowel sounds good Neuro - Alert, awake and oriented x 3, non focal exam. Skin - Warm and dry. Data Reviewed:      Latest Ref Rng & Units 10/14/2024   12:54 AM 10/13/2024    1:39 PM 10/13/2024    1:20 PM  CBC  WBC 4.0 - 10.5 K/uL 6.2   8.4   Hemoglobin 13.0 - 17.0 g/dL 86.2  85.3  85.0   Hematocrit 39.0 - 52.0 % 38.8  43.0  43.5   Platelets 150 - 400 K/uL 214   240       Latest Ref Rng & Units 10/14/2024   12:54 AM 10/13/2024    1:39 PM 10/13/2024    1:20 PM  BMP  Glucose 70 - 99 mg/dL 735  661  651  BUN 6 - 20 mg/dL 20  25  23    Creatinine 0.61 - 1.24 mg/dL 9.17  9.19  9.15   Sodium 135 - 145 mmol/L 138  136  135   Potassium 3.5 - 5.1 mmol/L 3.6  4.0  4.0   Chloride 98 - 111 mmol/L 104  102  101   CO2 22 - 32 mmol/L 27   24   Calcium  8.9 - 10.3 mg/dL 9.2   9.4    CT ABDOMEN PELVIS W CONTRAST Result Date: 10/13/2024 CLINICAL DATA:  Acute left-sided abdominal pain EXAM: CT ABDOMEN AND PELVIS WITH CONTRAST TECHNIQUE: Multidetector CT imaging of the abdomen and pelvis was performed using the standard protocol following bolus administration of intravenous contrast. RADIATION DOSE REDUCTION: This exam was performed according to the departmental dose-optimization program which includes automated exposure control, adjustment of the mA and/or kV according to patient size and/or use of iterative reconstruction technique. CONTRAST:  OMNIPAQUE  IOHEXOL  300 MG/ML  SOLN COMPARISON:  04/07/2024, 04/21/2024 FINDINGS: Lower chest: No acute pleural or parenchymal lung disease. Hepatobiliary: Diffuse hepatic steatosis. No focal liver abnormality.  The gallbladder is unremarkable. Pancreas: Unremarkable. No pancreatic ductal dilatation or surrounding inflammatory changes. Spleen: Normal in size without focal abnormality. Adrenals/Urinary Tract: Adrenal glands are unremarkable. Kidneys are normal, without renal calculi, focal lesion, or hydronephrosis. Bladder is unremarkable. Stomach/Bowel: No bowel obstruction or ileus. Normal appendix right lower quadrant. No bowel wall thickening or inflammatory change. Vascular/Lymphatic: Aortic atherosclerosis. No enlarged abdominal or pelvic lymph nodes. Reproductive: Prostate is unremarkable. Other: No free fluid or free intraperitoneal gas. No abdominal wall hernia. Musculoskeletal: There are inflammatory changes within the subcutaneous fat in the left gluteal region, extending into the left-sided perineum. A small rim enhancing fluid collection is seen anteriorly in the left perineum, measuring 1.7 x 1.8 cm. 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 No acute or destructive bony abnormalities. Reconstructed images demonstrate no additional findings. IMPRESSION: 1. Inflammatory changes within the left medial gluteal cleft and extending into the left side perineum, with small rim enhancing fluid collection as above. 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 This had previously measured 5.4 x 1.1 cm. 2.  Aortic Atherosclerosis (ICD10-I70.0). 3. Hepatic steatosis. Electronically Signed   By: Ozell Daring M.D.   On: 10/13/2024 15:03       Family Communication: discussed with patient at bedside Primary team communication: discussed with surgery team. Thank you very much for involving us  in the care of your patient.  Author: Concepcion Riser, MD 10/14/2024 3:13 PM  For on call review [email or web address].      [1] No Known Allergies

## 2024-10-14 NOTE — Plan of Care (Signed)
  Problem: Safety: Goal: Ability to remain free from injury will improve Outcome: Progressing   Problem: Pain Managment: Goal: General experience of comfort will improve and/or be controlled Outcome: Progressing   Problem: Elimination: Goal: Will not experience complications related to urinary retention Outcome: Progressing   Problem: Coping: Goal: Level of anxiety will decrease Outcome: Progressing   Problem: Activity: Goal: Risk for activity intolerance will decrease Outcome: Progressing

## 2024-10-14 NOTE — Op Note (Signed)
 10/14/2024  2:00 PM  PATIENT:  Drew [name]  49 y.o. male  Patient Care Team: [name], MD as PCP - General (Internal Medicine) Loni Soyla LABOR, MD as PCP - Cardiology (Cardiology)  PRE-OPERATIVE DIAGNOSIS:  PERI RECTAL ABSCESS  POST-OPERATIVE DIAGNOSIS:  * No post-op diagnosis entered *  PROCEDURE:  Procedures: EXAM UNDER ANESTHESIA, RECTUM  SURGEON:  Elspeth KYM Schultze, MD  ASSISTANT:  CANDIE Ronnald Nick, PA-S, [facility]   ANESTHESIA:  General endotracheal intubation anesthesia (GETA) and Anorectal and field block for perioperative & postoperative pain control provided with liposomal bupivacaine  (Experel) 20mL mixed with 30mL of bupivicaine 0.25% with epinephrine   Estimated Blood Loss (EBL):   Total I/O In: 95.5 [I.V.:54.6; IV Piggyback:40.9] Out: - .   (See anesthesia record)  Delay start of Pharmacological VTE agent (>24hrs) due to concerns of significant anemia, surgical blood loss, or risk of bleeding?:  no  DRAINS: (None)  SPECIMEN: Left anterior peritoneal abscess/sinus tract.  DISPOSITION OF SPECIMEN:  Pathology  COUNTS:  Sponge, needle, & instrument counts CORRECT at the conclusion of the case.      PLAN OF CARE: Admit to inpatient   PATIENT DISPOSITION:  PACU - hemodynamically stable.   INDICATION: Ryan-year-old diabetic with history of perineal/perirectal abscesses prior incision drainage with pain and swelling discomfort.  Came to ER with concern of drainage and pain and possible abscess.  Recommended operative exploration.  Possible seton placement.  The anatomy and physiology of skin abscesses was discussed. Pathophysiology of SQ abscess, possible progression to fasciitis & sepsis, etc discussed . I stressed good hygiene & wound care. Possible redebridement was discussed as well.   Possibility of recurrence was discussed. Risks, benefits, alternatives were discussed. I noted a good likelihood this will help address the problem. Risks  of anesthesia and other risks discussed. Questions answered. The patient is does wish to proceed.   OR FINDINGS:  Left perineal abscess: From the left perirectal region along the midline raphae towards the base of the left scrotum with chronic sinus tract.  At prior scars.  No evidence of any perirectal or perianal fistulous connection.  No evidence of any perirectal fistula.  No proctitis.    CASE DATA: Type of patient?: LDOW CASE (Surgical Hospitalist [facility] Inpatient) Status of Case? URGENT Add On Infection Present At Time Of Surgery (PATOS)?  PUS  DESCRIPTION:   Informed consent was confirmed. The patient received IV antibiotics. The patient underwent general anesthesia without any difficulty. The patient was positioned in high lithotomy. SCDs were active during the entire case. The area around the abscess was prepped and draped in a sterile fashion. A surgical timeout confirmed our plan.   Patient had 2 dots of purulence expression in the left posterior perineum at the site of a prior worsening in her scar.  Probed in the area and it seemed to be heading up towards the scrotum and not the perirectal region.  I injected the area with methylene blue .  I did inspection and saw no I going down into the very easily region.  Elements of any perirectal/perianal fistula.  Patient had soft stool in rectal vault and this was removed.  No evidence of any active proctitis.  No fissure.  No significant hemorrhoids.  Whitish tube.  A biconcave radial excision of the prior left posterolateral perineal scar with 2 spontaneous openings.  Last encountered a chronic sinus tract that headed anteriorly along the left midline raphae to the base of the scrotum.  Excised the whole  tract.  Purulence along the tract.  Encountered no obvious necrotizing fasciitis.  Because it was a long tract going to the base of scrotum I did a counterincision there at the apex.  Confirmed no evidence of any further tracking into the scrotum  or groins.  Nothing more onto the thighs.  Not consistent with necrotizing fasciitis.  No connection to the urethra.  No urine drainage.  We did irrigation.  I debrided out old granulation tissue and fistula tract & abscess cavity.  I assured hemostasis.  The wounds were packed with antibiotic-soaked rolled gauze.  Sterile dressings applied.  Patient is being extubated go to recovery room.   We plan to continue IV antibiotics and begin wound care training tomorrow.  No family available.  We will turn follow-up later.  Given his uncontrolled diabetes and numerous health issues and numerous nonsurgical questions concerns, I have asked [facility] hospitalists to assume care on manage which then we will continue to follow in consultation  Elspeth KYM Schultze, M.D., F.A.C.S. Gastrointestinal and [facility]imally Invasive [facility] [address], [address] [place], [place] [address] [phone] Main / Paging

## 2024-10-14 NOTE — Interval H&P Note (Signed)
 History and Physical Interval Note:  10/14/2024 11:28 AM  Drew [name]  has presented today for surgery, with the diagnosis of PERI RECTAL ABSCESS.  The various methods of treatment have been discussed with the patient and family. After consideration of risks, benefits and other options for treatment, the patient has consented to  Procedures with comments: EXAM UNDER ANESTHESIA, RECTUM (N/A) - SETON PLACEMENT as a surgical intervention.  The patient's history has been reviewed, patient examined, no change in status, stable for surgery.  I have reviewed the patient's chart and labs.  Questions were answered to the patient's satisfaction.    I have re-reviewed the the patient's records, history, medications, and allergies.  I have re-examined the patient.  I again discussed intraoperative plans and goals of post-operative recovery.  Telephone Spanish interpreter used.  The patient agrees to proceed.  Drew [name]  1975/11/22 [id number]  Patient Care Team: [name], MD as PCP - General (Internal Medicine) Loni Soyla LABOR, MD as PCP - Cardiology (Cardiology)  Patient Active Problem List   Diagnosis Date Noted   Perianal abscess 10/13/2024   Perineal abscess 04/21/2024   Abscess, gluteal, left 04/07/2024   Gluteal abscess 04/07/2024   CAD (coronary artery disease) 04/07/2024   Coronary artery calcification seen on CT scan 03/22/2024   Pure hypercholesterolemia 03/22/2024   Essential hypertension 07/14/2023   Hyperlipidemia due to type 1 diabetes mellitus (HCC) 07/14/2023   NSTEMI (non-ST elevated myocardial infarction) (HCC) 06/17/2023   Hypertensive urgency 06/17/2023   Hypertensive emergency 06/17/2023   Hyperglycemic crisis due to diabetes mellitus (HCC) 06/17/2023   Headache 06/17/2023   Diabetic retinopathy (HCC) 06/17/2023   Blindness of right eye 06/17/2023   Insulin  dependent type 1 diabetes mellitus (HCC) 06/17/2023   AKI (acute kidney injury) 06/17/2023    DKA (diabetic ketoacidosis) (HCC) 02/14/2014   Nausea with vomiting 02/14/2014    Past Medical History:  Diagnosis Date   Abscess, gluteal, left    Coronary artery disease    Diabetic retinopathy (HCC)    Hyperlipidemia associated with type 2 diabetes mellitus (HCC)    Hypertension    Type 2 diabetes mellitus (HCC)     Past Surgical History:  Procedure Laterality Date   IRRIGATION AND DEBRIDEMENT BUTTOCKS N/A 04/07/2024   Procedure: IRRIGATION AND DEBRIDEMENT BUTTOCKS;  Surgeon: Vernetta Berg, MD;  Location: [facility];  Service: General;  Laterality: N/A;    Social History   Socioeconomic History   Marital status: Single    Spouse name: Not on file   Number of children: Not on file   Years of education: Not on file   Highest education level: Not on file  Occupational History   Not on file  Tobacco Use   Smoking status: Never    Passive exposure: Current   Smokeless tobacco: Never  Vaping Use   Vaping status: Never Used  Substance and Sexual Activity   Alcohol use: No   Drug use: No   Sexual activity: Not on file  Other Topics Concern   Not on file  Social History Narrative   Not on file   Social Drivers of Health   Tobacco Use: Medium Risk (10/14/2024)   Patient History    Smoking Tobacco Use: Never    Smokeless Tobacco Use: Never    Passive Exposure: Current  Financial Resource Strain: High Risk (01/28/2024)   Overall Financial Resource Strain (CARDIA)    Difficulty of Paying Living Expenses: Very hard  Food Insecurity: Food Insecurity Present (10/13/2024)  Epic    Worried About Programme Researcher, Broadcasting/film/video in the Last Year: Often true    Barista in the Last Year: Often true  Transportation Needs: Unmet Transportation Needs (10/13/2024)   Epic    Lack of Transportation (Medical): Yes    Lack of Transportation (Non-Medical): Yes  Physical Activity: Not on file  Stress: Not on file  Social Connections: Not on file  Intimate Partner Violence: Not At Risk  (10/13/2024)   Epic    Fear of Current or Ex-Partner: No    Emotionally Abused: No    Physically Abused: No    Sexually Abused: No  Depression (PHQ2-9): Low Risk (09/19/2023)   Depression (PHQ2-9)    PHQ-2 Score: 0  Alcohol Screen: Not on file  Housing: High Risk (10/13/2024)   Epic    Unable to Pay for Housing in the Last Year: Yes    Number of Times Moved in the Last Year: 2    Homeless in the Last Year: No  Utilities: At Risk (10/13/2024)   Epic    Threatened with loss of utilities: Yes  Health Literacy: Inadequate Health Literacy (03/24/2024)   B1300 Health Literacy    Frequency of need for help with medical instructions: Always    History reviewed. No pertinent family history.  Medications Prior to Admission  Medication Sig Dispense Refill Last Dose/Taking   amLODipine  (NORVASC ) 5 MG tablet Take 1 tablet (5 mg total) by mouth daily. 90 tablet 1    atorvastatin  (LIPITOR) 20 MG tablet Take 1 tablet (20 mg total) by mouth daily. 90 tablet 1    carvedilol  (COREG ) 3.125 MG tablet Take 1 tablet (3.125 mg total) by mouth 2 (two) times daily with a meal. 180 tablet 2    gabapentin  (NEURONTIN ) 300 MG capsule Take 1 capsule (300 mg total) by mouth 2 (two) times daily. 180 capsule 3    glimepiride  (AMARYL ) 2 MG tablet Take 1 tablet (2 mg total) by mouth daily before breakfast. 90 tablet 2    ibuprofen  (ADVIL ) 600 MG tablet Take 1 tablet (600 mg total) by mouth every 8 (eight) hours as needed. (Patient taking differently: Take 600 mg by mouth every 8 (eight) hours as needed for mild pain (pain score 1-3) or moderate pain (pain score 4-6).) 30 tablet 0    Insulin  Glargine (BASAGLAR  KWIKPEN) 100 UNIT/ML Inject 25 Units into the skin 2 (two) times daily. 45 mL 1    Insulin  Pen Needle (PEN NEEDLES) 32G X 4 MM MISC Use to inject Basaglar  once daily. 100 each 0    Insulin  Syringe-Needle U-100 (TRUEPLUS INSULIN  SYRINGE) 31G X 5/16 0.5 ML MISC Use to inject 70/30 insulin  twice daily. 100 each 6     loperamide  (IMODIUM  A-D) 2 MG tablet Take 1 tablet (2 mg total) by mouth 3 (three) times daily as needed for diarrhea or loose stools. 30 tablet 1    miconazole  (MICATIN) 2 % cream Apply 1 Application topically 2 (two) times daily. 30 g 0    naphazoline-pheniramine (NAPHCON-A) 0.025-0.3 % ophthalmic solution Place 1 drop into both eyes 2 (two) times daily as needed for eye irritation or allergies. 15 mL 0    nystatin  cream (MYCOSTATIN ) Apply topically 2 (two) times daily. Apply to penile area, apply after retracting foreskin 30 g 0    oxyCODONE  (OXY IR/ROXICODONE ) 5 MG immediate release tablet Take 1 tablet (5 mg total) by mouth every 6 (six) hours as needed for moderate pain (pain score  4-6) or severe pain (pain score 7-10). 15 tablet 0     Current Facility-Administered Medications  Medication Dose Route Frequency Provider Last Rate Last Admin   0.9 %  sodium chloride  infusion   Intravenous Continuous Sheldon Standing, MD 40 mL/hr at 10/14/24 1111 New Bag at 10/14/24 1111   0.9 % NaCl with KCl 20 mEq/ L  infusion   Intravenous Continuous Vernetta Berg, MD 75 mL/hr at 10/13/24 2243 New Bag at 10/13/24 2243   [MAR Hold] amLODipine  (NORVASC ) tablet 5 mg  5 mg Oral Daily Vernetta Berg, MD   5 mg at 10/13/24 2243   [MAR Hold] bupivacaine  liposome (EXPAREL ) 1.3 % injection 266 mg  20 mL Infiltration On Call to OR Sheldon Standing, MD       [facility] Hold] carvedilol  (COREG ) tablet 3.125 mg  3.125 mg Oral BID WC Vernetta Berg, MD   3.125 mg at 10/14/24 1115   [MAR Hold] cefTRIAXone  (ROCEPHIN ) 2 g in sodium chloride  0.9 % 100 mL IVPB  2 g Intravenous On Call to OR Sheldon Standing, MD       [facility] Hold] Chlorhexidine  Gluconate Cloth 2 % PADS 6 each  6 each Topical Once Sheldon Standing, MD       [facility] Hold] enoxaparin  (LOVENOX ) injection 40 mg  40 mg Subcutaneous Q24H Vernetta Berg, MD   40 mg at 10/13/24 2246   [MAR Hold] gabapentin  (NEURONTIN ) capsule 300 mg  300 mg Oral BID Vernetta Berg, MD   300 mg at  10/13/24 2243   [MAR Hold] gabapentin  (NEURONTIN ) capsule 300 mg  300 mg Oral On Call to OR Sheldon Standing, MD       [facility] Hold] HYDROmorphone  (DILAUDID ) injection 1 mg  1 mg Intravenous Q2H PRN Vernetta Berg, MD       insulin  aspart (novoLOG ) injection 0-14 Units  0-14 Units Subcutaneous Q2H PRN Epifanio Charleston, MD   4 Units at 10/14/24 1117   [MAR Hold] insulin  aspart (novoLOG ) injection 0-15 Units  0-15 Units Subcutaneous Q4H Vernetta Berg, MD   2 Units at 10/14/24 0502   lactated ringers  infusion   Intravenous Continuous Sheldon Standing, MD       [MAR Hold] naphazoline-pheniramine (NAPHCON-A) 0.025-0.3 % ophthalmic solution 1 drop  1 drop Both Eyes BID PRN Vernetta Berg, MD   1 drop at 10/13/24 2334   [MAR Hold] ondansetron  (ZOFRAN -ODT) disintegrating tablet 4 mg  4 mg Oral Q6H PRN Vernetta Berg, MD       Or   ILDA Hold] ondansetron  (ZOFRAN ) injection 4 mg  4 mg Intravenous Q6H PRN Vernetta Berg, MD       ILDA Hold] oxyCODONE  (Oxy IR/ROXICODONE ) immediate release tablet 5-10 mg  5-10 mg Oral Q4H PRN Vernetta Berg, MD       [facility] Hold] piperacillin -tazobactam (ZOSYN ) IVPB 3.375 g  3.375 g Intravenous Q8H [place]ington, Abby K, RPH 12.5 mL/hr at 10/14/24 0511 3.375 g at 10/14/24 0511   [MAR Hold] traMADol  (ULTRAM ) tablet 50 mg  50 mg Oral Q6H PRN Vernetta Berg, MD         Allergies[1]  BP (!) 144/89   Pulse 91   Temp 98.4 F (36.9 C) (Oral)   Resp 18   Ht 5' 3 (1.6 m)   Wt 63.2 kg   SpO2 98%   BMI 24.68 kg/m   Labs: Results for orders placed or performed during the hospital encounter of 10/13/24 (from the past 48 hours)  CBC with Differential     Status: None  Collection Time: 10/13/24  1:20 PM  Result Value Ref Range   WBC 8.4 4.0 - 10.5 K/uL   RBC 5.20 4.22 - 5.81 MIL/uL   Hemoglobin 14.9 13.0 - 17.0 g/dL   HCT 56.4 60.9 - 47.9 %   MCV 83.7 80.0 - 100.0 fL   MCH 28.7 26.0 - 34.0 pg   MCHC 34.3 30.0 - 36.0 g/dL   RDW 87.3 88.4 - 84.4 %   Platelets  240 150 - 400 K/uL   nRBC 0.0 0.0 - 0.2 %   Neutrophils Relative % 64 %   Neutro Abs 5.4 1.7 - 7.7 K/uL   Lymphocytes Relative 27 %   Lymphs Abs 2.2 0.7 - 4.0 K/uL   Monocytes Relative 7 %   Monocytes Absolute 0.6 0.1 - 1.0 K/uL   Eosinophils Relative 2 %   Eosinophils Absolute 0.1 0.0 - 0.5 K/uL   Basophils Relative 0 %   Basophils Absolute 0.0 0.0 - 0.1 K/uL   Immature Granulocytes 0 %   Abs Immature Granulocytes 0.03 0.00 - 0.07 K/uL    Comment: Performed at [facility], [address] [place], [place] [address]  Comprehensive metabolic panel     Status: Abnormal   Collection Time: 10/13/24  1:20 PM  Result Value Ref Range   Sodium 135 135 - 145 mmol/L   Potassium 4.0 3.5 - 5.1 mmol/L   Chloride 101 98 - 111 mmol/L   CO2 24 22 - 32 mmol/L   Glucose, Bld 348 (H) 70 - 99 mg/dL    Comment: Glucose reference range applies only to samples taken after fasting for at least 8 hours.   BUN 23 (H) 6 - 20 mg/dL   Creatinine, Ser 9.15 0.61 - 1.24 mg/dL   Calcium  9.4 8.9 - 10.3 mg/dL   Total Protein 8.3 (H) 6.5 - 8.1 g/dL   Albumin 3.9 3.5 - 5.0 g/dL   AST 22 15 - 41 U/L   ALT 20 0 - 44 U/L   Alkaline Phosphatase 192 (H) 38 - 126 U/L   Total Bilirubin 0.6 0.0 - 1.2 mg/dL   GFR, Estimated >39 >39 mL/min    Comment: (NOTE) Calculated using the CKD-EPI Creatinine Equation (2021)    Anion gap 11 5 - 15    Comment: Performed at [facility], [address], [place], [place] [address]  I-stat chem 8, ED (not at [facility], [facility] or [facility])     Status: Abnormal   Collection Time: 10/13/24  1:39 PM  Result Value Ref Range   Sodium 136 135 - 145 mmol/L   Potassium 4.0 3.5 - 5.1 mmol/L   Chloride 102 98 - 111 mmol/L   BUN 25 (H) 6 - 20 mg/dL   Creatinine, Ser 9.19 0.61 - 1.24 mg/dL   Glucose, Bld 661 (H) 70 - 99 mg/dL    Comment: Glucose reference range applies only to samples taken after fasting for at least 8 hours.   Calcium , Ion 1.16 1.15 - 1.40 mmol/L    TCO2 23 22 - 32 mmol/L   Hemoglobin 14.6 13.0 - 17.0 g/dL   HCT 56.9 60.9 - 47.9 %  Urinalysis, Routine w reflex microscopic -Urine, Clean Catch     Status: Abnormal   Collection Time: 10/13/24  8:07 PM  Result Value Ref Range   Color, Urine YELLOW YELLOW   APPearance CLEAR CLEAR   Specific Gravity, Urine 1.044 (H) 1.005 - 1.030   pH 5.0 5.0 - 8.0  Glucose, UA >=500 (A) NEGATIVE mg/dL   Hgb urine dipstick SMALL (A) NEGATIVE   Bilirubin Urine NEGATIVE NEGATIVE   Ketones, ur NEGATIVE NEGATIVE mg/dL   Protein, ur >=699 (A) NEGATIVE mg/dL   Nitrite NEGATIVE NEGATIVE   Leukocytes,Ua NEGATIVE NEGATIVE   RBC / HPF 0-5 0 - 5 RBC/hpf   WBC, UA 0-5 0 - 5 WBC/hpf   Bacteria, UA NONE SEEN NONE SEEN   Squamous Epithelial / HPF 0-5 0 - 5 /HPF   Hyaline Casts, UA PRESENT     Comment: Performed at [facility], [address] [place], [place] [address]  Glucose, capillary     Status: Abnormal   Collection Time: 10/13/24  9:20 PM  Result Value Ref Range   Glucose-Capillary 295 (H) 70 - 99 mg/dL    Comment: Glucose reference range applies only to samples taken after fasting for at least 8 hours.  Glucose, capillary     Status: Abnormal   Collection Time: 10/14/24 12:47 AM  Result Value Ref Range   Glucose-Capillary 252 (H) 70 - 99 mg/dL    Comment: Glucose reference range applies only to samples taken after fasting for at least 8 hours.  HIV Antibody (routine testing w rflx)     Status: None   Collection Time: 10/14/24 12:54 AM  Result Value Ref Range   HIV Screen 4th Generation wRfx Non Reactive Non Reactive    Comment: Performed at [facility], [address] [place], [place] [address]  Basic metabolic panel     Status: Abnormal   Collection Time: 10/14/24 12:54 AM  Result Value Ref Range   Sodium 138 135 - 145 mmol/L   Potassium 3.6 3.5 - 5.1 mmol/L   Chloride 104 98 - 111 mmol/L   CO2 27 22 - 32 mmol/L   Glucose, Bld 264 (H) 70 - 99 mg/dL    Comment:  Glucose reference range applies only to samples taken after fasting for at least 8 hours.   BUN 20 6 - 20 mg/dL   Creatinine, Ser 9.17 0.61 - 1.24 mg/dL   Calcium  9.2 8.9 - 10.3 mg/dL   GFR, Estimated >39 >39 mL/min    Comment: (NOTE) Calculated using the CKD-EPI Creatinine Equation (2021)    Anion gap 7 5 - 15    Comment: Performed at [facility], [address], [place], [place] [address]  CBC     Status: Abnormal   Collection Time: 10/14/24 12:54 AM  Result Value Ref Range   WBC 6.2 4.0 - 10.5 K/uL   RBC 4.71 4.22 - 5.81 MIL/uL   Hemoglobin 13.7 13.0 - 17.0 g/dL   HCT 61.1 (L) 60.9 - 47.9 %   MCV 82.4 80.0 - 100.0 fL   MCH 29.1 26.0 - 34.0 pg   MCHC 35.3 30.0 - 36.0 g/dL   RDW 87.5 88.4 - 84.4 %   Platelets 214 150 - 400 K/uL   nRBC 0.0 0.0 - 0.2 %    Comment: Performed at [facility], [address] [place], [place] [address]  Hemoglobin A1c     Status: Abnormal   Collection Time: 10/14/24 12:54 AM  Result Value Ref Range   Hgb A1c MFr Bld 11.3 (H) 4.8 - 5.6 %    Comment: (NOTE) Diagnosis of Diabetes The following HbA1c ranges recommended by the American Diabetes Association (ADA) may be used as an aid in the diagnosis of diabetes mellitus.  Hemoglobin  Suggested A1C NGSP%              Diagnosis  <5.7                   Non Diabetic  5.7-6.4                Pre-Diabetic  >6.4                   Diabetic  <7.0                   Glycemic control for                       adults with diabetes.     Mean Plasma Glucose 277.61 mg/dL    Comment: Performed at [facility], [address] [place], [place] [address]  Glucose, capillary     Status: Abnormal   Collection Time: 10/14/24  4:35 AM  Result Value Ref Range   Glucose-Capillary 130 (H) 70 - 99 mg/dL    Comment: Glucose reference range applies only to samples taken after fasting for at least 8 hours.  Glucose, capillary     Status: Abnormal   Collection  Time: 10/14/24  7:26 AM  Result Value Ref Range   Glucose-Capillary 114 (H) 70 - 99 mg/dL    Comment: Glucose reference range applies only to samples taken after fasting for at least 8 hours.  Glucose, capillary     Status: Abnormal   Collection Time: 10/14/24 11:10 AM  Result Value Ref Range   Glucose-Capillary 184 (H) 70 - 99 mg/dL    Comment: Glucose reference range applies only to samples taken after fasting for at least 8 hours.    Imaging / Studies: CT ABDOMEN PELVIS W CONTRAST Result Date: 10/13/2024 CLINICAL DATA:  Acute left-sided abdominal pain EXAM: CT ABDOMEN AND PELVIS WITH CONTRAST TECHNIQUE: Multidetector CT imaging of the abdomen and pelvis was performed using the standard protocol following bolus administration of intravenous contrast. RADIATION DOSE REDUCTION: This exam was performed according to the departmental dose-optimization program which includes automated exposure control, adjustment of the mA and/or kV according to patient size and/or use of iterative reconstruction technique. CONTRAST:  OMNIPAQUE  IOHEXOL  300 MG/ML  SOLN COMPARISON:  04/07/2024, 04/21/2024 FINDINGS: Lower chest: No acute pleural or parenchymal lung disease. Hepatobiliary: Diffuse hepatic steatosis. No focal liver abnormality. The gallbladder is unremarkable. Pancreas: Unremarkable. No pancreatic ductal dilatation or surrounding inflammatory changes. Spleen: Normal in size without focal abnormality. Adrenals/Urinary Tract: Adrenal glands are unremarkable. Kidneys are normal, without renal calculi, focal lesion, or hydronephrosis. Bladder is unremarkable. Stomach/Bowel: No bowel obstruction or ileus. Normal appendix right lower quadrant. No bowel wall thickening or inflammatory change. Vascular/Lymphatic: Aortic atherosclerosis. No enlarged abdominal or pelvic lymph nodes. Reproductive: Prostate is unremarkable. Other: No free fluid or free intraperitoneal gas. No abdominal wall hernia. Musculoskeletal:  There are inflammatory changes within the subcutaneous fat in the left gluteal region, extending into the left-sided perineum. A small rim enhancing fluid collection is seen anteriorly in the left perineum, measuring 1.7 x 1.8 cm. 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 No acute or destructive bony abnormalities. Reconstructed images demonstrate no additional findings. IMPRESSION: 1. Inflammatory changes within the left medial gluteal cleft and extending into the left side perineum, with small rim enhancing fluid collection as above. 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 This had previously measured 5.4 x 1.1 cm. 2.  Aortic Atherosclerosis (ICD10-I70.0). 3. Hepatic steatosis. Electronically Signed   By: Ozell Daring M.D.   On: 10/13/2024 15:03     .Elspeth KYM Schultze, M.D., F.A.C.S. Gastrointestinal and Minimally Invasive Surgery [facility] [address] [place], [place] [address] [phone] Main / Paging  10/14/2024 11:29 AM    Elspeth JAYSON Schultze      [1] No Known Allergies

## 2024-10-15 ENCOUNTER — Encounter (HOSPITAL_COMMUNITY): Payer: Self-pay | Admitting: Surgery

## 2024-10-15 LAB — SURGICAL PATHOLOGY

## 2024-10-15 LAB — BASIC METABOLIC PANEL WITH GFR
Anion gap: 9 (ref 5–15)
BUN: 15 mg/dL (ref 6–20)
CO2: 24 mmol/L (ref 22–32)
Calcium: 8.5 mg/dL — ABNORMAL LOW (ref 8.9–10.3)
Chloride: 102 mmol/L (ref 98–111)
Creatinine, Ser: 0.89 mg/dL (ref 0.61–1.24)
GFR, Estimated: >60 mL/min
Glucose, Bld: 252 mg/dL — ABNORMAL HIGH (ref 70–99)
Potassium: 3.9 mmol/L (ref 3.5–5.1)
Sodium: 135 mmol/L (ref 135–145)

## 2024-10-15 LAB — GLUCOSE, CAPILLARY
Glucose-Capillary: 176 mg/dL — ABNORMAL HIGH (ref 70–99)
Glucose-Capillary: 131 mg/dL — ABNORMAL HIGH (ref 70–99)
Glucose-Capillary: 157 mg/dL — ABNORMAL HIGH (ref 70–99)
Glucose-Capillary: 240 mg/dL — ABNORMAL HIGH (ref 70–99)

## 2024-10-15 LAB — CBC
HCT: 35 % — ABNORMAL LOW (ref 39.0–52.0)
Hemoglobin: 12 g/dL — ABNORMAL LOW (ref 13.0–17.0)
MCH: 28.7 pg (ref 26.0–34.0)
MCHC: 34.3 g/dL (ref 30.0–36.0)
MCV: 83.7 fL (ref 80.0–100.0)
Platelets: 190 K/uL (ref 150–400)
RBC: 4.18 MIL/uL — ABNORMAL LOW (ref 4.22–5.81)
RDW: 12.6 % (ref 11.5–15.5)
WBC: 7.6 K/uL (ref 4.0–10.5)
nRBC: 0 % (ref 0.0–0.2)

## 2024-10-15 LAB — MAGNESIUM: Magnesium: 2.3 mg/dL (ref 1.7–2.4)

## 2024-10-15 MED ORDER — MECLIZINE HCL 25 MG PO TABS
25.0000 mg | ORAL_TABLET | Freq: Three times a day (TID) | ORAL | Status: DC | PRN
  Administered 2024-10-16: 25 mg via ORAL
  Filled 2024-10-15: qty 1

## 2024-10-15 NOTE — Progress Notes (Signed)
 " Progress Note   Patient: Drew [name] FMW:[id number] DOB: Apr 25, 1976 DOA: 10/13/2024     2 DOS: the patient was seen and examined on 10/15/2024   Brief hospital course: Drew [name] is a 49 y.o. male with past medical history of type 2 diabetes mellitus, hyperlipidemia, hypertension, CAD, gluteal abscess, perineal abscess who is admitted to general surgery service for perirectal abscess I&D. [facility] took over his inpatient care.  Assessment and Plan: Perirectal abscess: Status post I&D by surgery team. Continue Zosyn  therapy. Continue pain control, surgery team removed packing, advised TID sitz baths and PRN. Continue constipation regimen.   Uncontrolled type 1 diabetes mellitus: A1c 11.3. Blood sugars stable as he is NPO. Continue Semglee  25u Q12. Continue Accu-Cheks, sliding scale insulin . Discussed with him the need for tight blood sugar control for wound healing. Diabetes educator on board. TOC for dc plan. Carb consistent diet ordered.   Diabetic neuropathy, retinopathy- States she has bilateral leg pain, tingling, numbness, balance issues. He does have blindness, unable to take care of himself for the wound. Brother unable to help him.  Patient will need TOC evaluation for home health services.   Hypertension- Resume Coreg , Norvasc .   Hyperlipidemia- Resume statin upon discharge.     Out of bed to chair. Incentive spirometry. Nursing supportive care. Fall, aspiration precautions. Diet:  Diet Orders (From admission, onward)     Start     Ordered   10/14/24 1514  Diet Carb Modified Room service appropriate? Yes  Diet effective now       Comments: If patient experiences nausea, vomiting, worsening pain, or worsening distention; then, make NPO with sips/ice chips only until seen by MD  Question Answer Comment  Calorie Level Medium 1600-2000   Fluid consistency: Thin   Room service appropriate? Yes   Na restriction, if any: No Added Salt      10/14/24 1513            DVT prophylaxis: SCD's Start: 10/14/24 9178 enoxaparin  (LOVENOX ) injection 40 mg Start: 10/13/24 2200 SCDs Start: 10/13/24 2037  Level of care: Med-Surg   Code Status: Full Code  Subjective: Patient is seen and examined today morning. I used interpreter service. He asked about pain meds, and meds to help dizziness.   Physical Exam: Vitals:   10/15/24 0237 10/15/24 0602 10/15/24 0937 10/15/24 1408  BP: 111/77 108/71 (!) 169/92 132/76  Pulse: 90 87 92 95  Resp: 18 18 17 17   Temp: 98.4 F (36.9 C) 99.1 F (37.3 C) 98.4 F (36.9 C) 98 F (36.7 C)  TempSrc:  Oral Oral Oral  SpO2: 95% 96% 100% 100%  Weight:      Height:        General - Middle aged Hispanic male, no apparent distress HEENT - PERRLA, EOMI, atraumatic head, non tender sinuses. Lung - Clear, no rales, rhonchi, wheezes. Heart - S1, S2 heard, no murmurs, rubs, trace pedal edema. Abdomen - Soft, non tender, bowel sounds good Neuro - Alert, awake and oriented x 3, non focal exam. Skin - Warm and dry.  Data Reviewed:      Latest Ref Rng & Units 10/15/2024    3:13 AM 10/14/2024   12:54 AM 10/13/2024    1:39 PM  CBC  WBC 4.0 - 10.5 K/uL 7.6  6.2    Hemoglobin 13.0 - 17.0 g/dL 87.9  86.2  85.3   Hematocrit 39.0 - 52.0 % 35.0  38.8  43.0   Platelets 150 - 400 K/uL 190  214        Latest Ref Rng & Units 10/15/2024    3:13 AM 10/14/2024   12:54 AM 10/13/2024    1:39 PM  BMP  Glucose 70 - 99 mg/dL 747  735  661   BUN 6 - 20 mg/dL 15  20  25    Creatinine 0.61 - 1.24 mg/dL 9.10  9.17  9.19   Sodium 135 - 145 mmol/L 135  138  136   Potassium 3.5 - 5.1 mmol/L 3.9  3.6  4.0   Chloride 98 - 111 mmol/L 102  104  102   CO2 22 - 32 mmol/L 24  27    Calcium  8.9 - 10.3 mg/dL 8.5  9.2     No results found.  Family Communication: Discussed with patient using interpreter. He understand and agree. All questions answered.  Disposition: Status is: Inpatient Remains inpatient appropriate because: IV  antibiotics, wound care, PT/ OT  Planned Discharge Destination: Home with Home Health     Time spent: 45 minutes  Author: Concepcion Riser, MD 10/15/2024 6:11 PM Secure chat 7am to 7pm For on call review [email or web address].    "

## 2024-10-15 NOTE — Plan of Care (Signed)
   Problem: Education: Goal: Knowledge of General Education information will improve Description: Including pain rating scale, medication(s)/side effects and non-pharmacologic comfort measures Outcome: Progressing   Problem: Activity: Goal: Risk for activity intolerance will decrease Outcome: Progressing   Problem: Nutrition: Goal: Adequate nutrition will be maintained Outcome: Progressing

## 2024-10-15 NOTE — Plan of Care (Signed)
" °  Problem: Urinary Elimination: Goal: Ability to achieve and maintain adequate urine output Outcome: Progressing   Problem: Clinical Measurements: Goal: Postoperative complications will be avoided or minimized Outcome: Progressing   Problem: Metabolic: Goal: Ability to maintain appropriate glucose levels will improve Outcome: Progressing   Problem: Safety: Goal: Ability to remain free from injury will improve Outcome: Progressing   Problem: Pain Managment: Goal: General experience of comfort will improve and/or be controlled Outcome: Progressing   "

## 2024-10-15 NOTE — Anesthesia Postprocedure Evaluation (Signed)
"   Anesthesia Post Note  Patient: Drew [name]  Procedure(s) Performed: EXAM UNDER ANESTHESIA, RECTUM (Perineum)     Patient location during evaluation: PACU Anesthesia Type: General Level of consciousness: awake and alert Pain management: pain level controlled Vital Signs Assessment: post-procedure vital signs reviewed and stable Respiratory status: spontaneous breathing, nonlabored ventilation, respiratory function stable and patient connected to nasal cannula oxygen Cardiovascular status: blood pressure returned to baseline and stable Postop Assessment: no apparent nausea or vomiting Anesthetic complications: no   No notable events documented.  Last Vitals:  Vitals:   10/15/24 0602 10/15/24 0937  BP: 108/71 (!) 169/92  Pulse: 87 92  Resp: 18 17  Temp: 37.3 C 36.9 C  SpO2: 96% 100%    Last Pain:  Vitals:   10/15/24 1129  TempSrc:   PainSc: 7                  [name]      "

## 2024-10-15 NOTE — Progress Notes (Signed)
 [facility] Progress Note  1 Day Post-Op  Subjective: CC:  Reports worse pain today than before surgery, particularly near his left testicle. Otherwise no new complaints. Afebrile.   WBC 7.6 Hgb 12 from 13.7  Objective: Vital signs in last 24 hours: Temp:  [97.4 F (36.3 C)-99.1 F (37.3 C)] 98.4 F (36.9 C) (01/16 0937) Pulse Rate:  [87-98] 92 (01/16 0937) Resp:  [11-18] 17 (01/16 0937) BP: (108-169)/(67-92) 169/92 (01/16 0937) SpO2:  [93 %-100 %] 100 % (01/16 0937) Weight:  [63.2 kg] 63.2 kg (01/15 1046) Last BM Date : 10/12/24  Intake/Output from previous day: 01/15 0701 - 01/16 0700 In: 3539.7 [P.O.:1440; I.V.:1827.1; IV Piggyback:272.6] Out: 10 [Blood:10] Intake/Output this shift: Total I/O In: 220 [P.O.:220] Out: -   PE: Gen:  Alert, NAD, pleasant and cooperative Card:  Regular rate and rhythm, no lower extremity edema  Pulm:  Normal effort ORA Abd: Soft, non-tender, non-distended GU: packing removed from two surgical incisions - one left perianal/posterior perineal incision roughly 3x1x ~7 cm, counter incision anterior perineam 1x1 c m. Some sanguinous oozing from posterior incision controlled with pressure and gauze. Periwound is soft, no induration or cellulitis.   Lab Results:  Recent Labs    10/14/24 0054 10/15/24 0313  WBC 6.2 7.6  HGB 13.7 12.0*  HCT 38.8* 35.0*  PLT 214 190   BMET Recent Labs    10/14/24 0054 10/15/24 0313  NA 138 135  K 3.6 3.9  CL 104 102  CO2 27 24  GLUCOSE 264* 252*  BUN 20 15  CREATININE 0.82 0.89  CALCIUM  9.2 8.5*   PT/INR No results for input(s): LABPROT, INR in the last 72 hours. CMP     Component Value Date/Time   NA 135 10/15/2024 0313   NA 139 07/14/2023 1609   K 3.9 10/15/2024 0313   CL 102 10/15/2024 0313   CO2 24 10/15/2024 0313   GLUCOSE 252 (H) 10/15/2024 0313   BUN 15 10/15/2024 0313   BUN 20 07/14/2023 1609   CREATININE 0.89 10/15/2024 0313   CALCIUM  8.5 (L) 10/15/2024 0313    PROT 8.3 (H) 10/13/2024 1320   ALBUMIN 3.9 10/13/2024 1320   AST 22 10/13/2024 1320   ALT 20 10/13/2024 1320   ALKPHOS 192 (H) 10/13/2024 1320   BILITOT 0.6 10/13/2024 1320   GFRNONAA >60 10/15/2024 0313   GFRAA >60 07/31/2018 1904   Lipase     Component Value Date/Time   LIPASE 45 08/20/2023 2103       Studies/Results: CT ABDOMEN PELVIS W CONTRAST Result Date: 10/13/2024 CLINICAL DATA:  Acute left-sided abdominal pain EXAM: CT ABDOMEN AND PELVIS WITH CONTRAST TECHNIQUE: Multidetector CT imaging of the abdomen and pelvis was performed using the standard protocol following bolus administration of intravenous contrast. RADIATION DOSE REDUCTION: This exam was performed according to the departmental dose-optimization program which includes automated exposure control, adjustment of the mA and/or kV according to patient size and/or use of iterative reconstruction technique. CONTRAST:  OMNIPAQUE  IOHEXOL  300 MG/ML  SOLN COMPARISON:  04/07/2024, 04/21/2024 FINDINGS: Lower chest: No acute pleural or parenchymal lung disease. Hepatobiliary: Diffuse hepatic steatosis. No focal liver abnormality. The gallbladder is unremarkable. Pancreas: Unremarkable. No pancreatic ductal dilatation or surrounding inflammatory changes. Spleen: Normal in size without focal abnormality. Adrenals/Urinary Tract: Adrenal glands are unremarkable. Kidneys are normal, without renal calculi, focal lesion, or hydronephrosis. Bladder is unremarkable. Stomach/Bowel: No bowel obstruction or ileus. Normal appendix right lower quadrant. No bowel wall thickening or inflammatory change.  Vascular/Lymphatic: Aortic atherosclerosis. No enlarged abdominal or pelvic lymph nodes. Reproductive: Prostate is unremarkable. Other: No free fluid or free intraperitoneal gas. No abdominal wall hernia. Musculoskeletal: There are inflammatory changes within the subcutaneous fat in the left gluteal region, extending into the left-sided perineum. A  small rim enhancing fluid collection is seen anteriorly in the left perineum, measuring 1.7 x 1.8 cm. 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 No acute or destructive bony abnormalities. Reconstructed images demonstrate no additional findings. IMPRESSION: 1. Inflammatory changes within the left medial gluteal cleft and extending into the left side perineum, with small rim enhancing fluid collection as above. 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 This had previously measured 5.4 x 1.1 cm. 2.  Aortic Atherosclerosis (ICD10-I70.0). 3. Hepatic steatosis. Electronically Signed   By: Ozell Daring M.D.   On: 10/13/2024 15:03    Anti-infectives: Anti-infectives (From admission, onward)    Start     Dose/Rate Route Frequency Ordered Stop   10/15/24 0600  cefTRIAXone  (ROCEPHIN ) 2 g in sodium chloride  0.9 % 100 mL IVPB        2 g 200 mL/hr over 30 Minutes Intravenous On call to O.R. 10/14/24 0823 10/14/24 1515   10/13/24 2000  piperacillin -tazobactam (ZOSYN ) IVPB 3.375 g        3.375 g 12.5 mL/hr over 240 Minutes Intravenous Every 8 hours 10/13/24 1851 10/20/24 2159        Assessment/Plan  Perirectal/perineal abscess POD#1 s/p EUA, I&D 10/14/24 Dr. Sheldon Afebrile, VSS  Packing removing  Start TID sitz baths and PRN.  Continue stool softeners and fiber supplementation.  Will discuss duration of abx with MD.     LOS: 2 days   I reviewed nursing notes, hospitalist notes, last 24 h vitals and pain scores, last 48 h intake and output, last 24 h labs and trends, and last 24 h imaging results.  This care required moderate level of medical decision making.   Almarie Pringle, PA-C [facility] Please see Amion for pager number during day hours 7:00am-4:30pm

## 2024-10-15 NOTE — Inpatient Diabetes Management (Signed)
 Inpatient Diabetes Program Recommendations  AACE/ADA: New Consensus Statement on Inpatient Glycemic Control (2015)  Target Ranges:  Prepandial:   less than 140 mg/dL      Peak postprandial:   less than 180 mg/dL (1-2 hours)      Critically ill patients:  140 - 180 mg/dL   Lab Results  Component Value Date   GLUCAP 131 (H) 10/15/2024   HGBA1C 11.3 (H) 10/14/2024    Review of Glycemic Control  Diabetes history: DM Outpatient Diabetes medications: Basaglar  25 BID, Amaryl  2 mg daily Current orders for Inpatient glycemic control: Lantus  25 BID, Novolog  0-15 TID with meals and 0-5 HS  HgbA1C - 11.3% 176, 131. Eating 100%  Inpatient Diabetes Program Recommendations:    Contacted TOC Social Work to see if pt could get assistance with obtaining a [employer] [facility] Glucose meter. She recommended pt f/u with PCP. Pt tells me that his PCP told him that he didn't need to monitor his blood sugars. When asked about hypoglycemia, pt says he does have this occasionally. Treats with juice.  Continue to follow.  Thank you. Shona Brandy, RD, LDN, CDCES Inpatient Diabetes Coordinator [phone]

## 2024-10-15 NOTE — TOC Initial Note (Signed)
 Transition of Care [facility]) - Initial/Assessment Note    Patient Details  Name: Drew [name] MRN: [id number] Date of Birth: Jan 09, 1976  Transition of Care [facility]) CM/SW Contact:    NORMAN ASPEN, LCSW Phone Number: 10/15/2024, 1:21 PM  Clinical Narrative:                  Met with pt today with assistance from the interpretor ipad.  Confirming with pt that he is living at the address on record.  He explains that he shares a rented room with his brother in a trailer.  Two others, a mother and son, are also in the home.  Pt reports that his brother is rarely at home as he usually works from early morning to nighttime and he doesn't help me. He explains that he has only been in this home for a short time and is still learning the layout but still bumping into a lot of things.   Pt confirms that his [facility]CP [facility] at [facility].  If appointments are scheduled on Mondays, then the other gentleman in the home has promised to assist pt with transportation.  He notes that this person can, also, assist with dc transport home from the hospital as long as he can give him a days notice.    Regarding the current wound care management, pt feels confident that he can manage this on his own.  He really has no concerns about this but is more focused on ongoing struggle to find ophthalmologist coverage.  Notes the pain in his eyes is often very great and his loss of sight prevents him from working.  Noted to pt that, per chart documentation, it appears the Case Manager at [facility] and Wellness and with HF program have been working on securing eye care services.  Unfortunately, having a difficult time finding MD who will accept him under the CAFA program ([facility] assist).  Explained to pt that I would reach out to these case managers and alert of his hospitalization and see if any update on eye care services.  Will alert MD/RN of need to give patient as much notice on  anticipated dc as possible so transportation can be arranged.  Per pt request, will also note on chart that pt's contacts are SPANISH SPEAKING ONLY.  He notes that he is missing some follow up calls due to brother or others in the home cannot understand what is being said and they will hang up.   At this point, no other IP CM needs identified.  Remain available if needed.  Expected Discharge Plan: Home/Self Care Barriers to Discharge: Continued Medical Work up, Inadequate or no insurance   Patient Goals and CMS Choice Patient states their goals for this hospitalization and ongoing recovery are:: primary goal is to see opthamologist ASAP          Expected Discharge Plan and Services In-house Referral: Clinical Social Work, Designer, Jewellery     Living arrangements for the past 2 months: Mobile Home                 DME Arranged: N/A DME Agency: NA                  Prior Living Arrangements/Services Living arrangements for the past 2 months: Mobile Home Lives with:: Relatives, Roommate Patient language and need for interpreter reviewed:: Yes Do you feel safe going back to the place where you live?: Yes  Need for Family Participation in Patient Care: No (Comment) Care giver support system in place?: Yes (comment)   Criminal Activity/Legal Involvement Pertinent to Current Situation/Hospitalization: No - Comment as needed  Activities of Daily Living   ADL Screening (condition at time of admission) Independently performs ADLs?: Yes (appropriate for developmental age) Is the patient deaf or have difficulty hearing?: No Does the patient have difficulty seeing, even when wearing glasses/contacts?: Yes (Legally blind) Does the patient have difficulty concentrating, remembering, or making decisions?: No  Permission Sought/Granted Permission sought to share information with : Family Supports Permission granted to share information with : Yes, Verbal Permission  Granted  Share Information with NAME: brother, Reeve Turnley @ [phone] (SPEAKS SPANISH ONLY)  Permission granted to share info w AGENCY: [facility] and HF program LCSWs        Emotional Assessment Appearance:: Appears stated age Attitude/Demeanor/Rapport: Gracious Affect (typically observed): Accepting Orientation: : Oriented to Self, Oriented to Place, Oriented to  Time, Oriented to Situation Alcohol / Substance Use: Not Applicable Psych Involvement: No (comment)  Admission diagnosis:  Perianal abscess [K61.0] Diabetic polyneuropathy associated with type 2 diabetes mellitus (HCC) [E11.42] Hypertension associated with type 2 diabetes mellitus (HCC) [E11.59, I15.2] Hypertension associated with type 1 diabetes mellitus (HCC) [E10.59, I15.2] Patient Active Problem List   Diagnosis Date Noted   Non-English speaking patient (Spanish) 10/14/2024   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) 10/14/2024   Perineal abscess 04/21/2024   CAD (coronary artery disease) 04/07/2024   Coronary artery calcification seen on CT scan 03/22/2024   Pure hypercholesterolemia 03/22/2024   Essential hypertension 07/14/2023   Hyperlipidemia due to type 1 diabetes mellitus (HCC) 07/14/2023   NSTEMI (non-ST elevated myocardial infarction) (HCC) 06/17/2023   Hypertensive urgency 06/17/2023   Hypertensive emergency 06/17/2023   Hyperglycemic crisis due to diabetes mellitus (HCC) 06/17/2023   Headache 06/17/2023   Diabetic retinopathy (HCC) 06/17/2023   Blindness of right eye 06/17/2023   Insulin  dependent type 1 diabetes mellitus (HCC) 06/17/2023   AKI (acute kidney injury) 06/17/2023   DKA (diabetic ketoacidosis) (HCC) 02/14/2014   Nausea with vomiting 02/14/2014   PCP:  [name], MD Pharmacy:   [facility] [address], [address] [place] [address] Phone: [phone] Fax: [phone]  [id number], [place] - [address] AT [facility] [address] [facility] [place] [address] Phone: [phone] Fax: [phone]  [facility] [address], [place] - [address] [place] [address] Phone: [phone] Fax: [phone]     Social Drivers of Health (SDOH) Social History: SDOH Screenings   Food Insecurity: Food Insecurity Present (10/13/2024)  Housing: High Risk (10/13/2024)  Transportation Needs: Unmet Transportation Needs (10/13/2024)  Utilities: At Risk (10/13/2024)  Depression (PHQ2-9): Low Risk (09/19/2023)  Financial Resource Strain: High Risk (01/28/2024)  Tobacco Use: Medium Risk (10/14/2024)  Health Literacy: Inadequate Health Literacy (03/24/2024)   SDOH Interventions: Food Insecurity Interventions: Inpatient TOC, Intervention Not Indicated Housing Interventions: Inpatient TOC, Intervention Not Indicated Transportation Interventions: Inpatient TOC, Intervention Not Indicated Utilities Interventions: Inpatient TOC, Intervention Not Indicated   Readmission Risk Interventions    10/15/2024   12:07 PM 04/23/2024    2:28 PM 04/08/2024    2:26 PM  Readmission Risk Prevention Plan  Post Dischage Appt  Complete   Medication Screening  Complete   Transportation Screening Complete Complete Complete  PCP  or Specialist Appt within 5-7 Days Complete  Complete  Home Care Screening Complete  Complete  Medication Review (RN CM) Complete  Complete

## 2024-10-16 LAB — GLUCOSE, CAPILLARY
Glucose-Capillary: 182 mg/dL — ABNORMAL HIGH (ref 70–99)
Glucose-Capillary: 162 mg/dL — ABNORMAL HIGH (ref 70–99)
Glucose-Capillary: 117 mg/dL — ABNORMAL HIGH (ref 70–99)
Glucose-Capillary: 290 mg/dL — ABNORMAL HIGH (ref 70–99)

## 2024-10-16 MED ORDER — LOPERAMIDE HCL 2 MG PO CAPS
2.0000 mg | ORAL_CAPSULE | ORAL | Status: DC | PRN
  Administered 2024-10-16: 2 mg via ORAL
  Filled 2024-10-16: qty 1

## 2024-10-16 NOTE — Plan of Care (Signed)

## 2024-10-16 NOTE — Progress Notes (Signed)
 [facility] Progress Note  2 Days Post-Op  Subjective:  Pain a bit better   Objective: Vital signs in last 24 hours: Temp:  [98 F (36.7 C)-98.6 F (37 C)] 98.4 F (36.9 C) (01/17 0801) Pulse Rate:  [85-95] 85 (01/17 0801) Resp:  [16-17] 16 (01/17 0801) BP: (117-154)/(74-85) 154/85 (01/17 0801) SpO2:  [95 %-100 %] 95 % (01/17 0801) Last BM Date : 10/15/24  Intake/Output from previous day: 01/16 0701 - 01/17 0700 In: 1914 [P.O.:1380; I.V.:384; IV Piggyback:150] Out: -  Intake/Output this shift: Total I/O In: 220 [P.O.:220] Out: -   PE: Gen:  Alert, NAD, pleasant and cooperative GU: dressing dry and intact. Periwound is soft, no induration or cellulitis.   Lab Results:  Recent Labs    10/14/24 0054 10/15/24 0313  WBC 6.2 7.6  HGB 13.7 12.0*  HCT 38.8* 35.0*  PLT 214 190   BMET Recent Labs    10/14/24 0054 10/15/24 0313  NA 138 135  K 3.6 3.9  CL 104 102  CO2 27 24  GLUCOSE 264* 252*  BUN 20 15  CREATININE 0.82 0.89  CALCIUM  9.2 8.5*   PT/INR No results for input(s): LABPROT, INR in the last 72 hours. CMP     Component Value Date/Time   NA 135 10/15/2024 0313   NA 139 07/14/2023 1609   K 3.9 10/15/2024 0313   CL 102 10/15/2024 0313   CO2 24 10/15/2024 0313   GLUCOSE 252 (H) 10/15/2024 0313   BUN 15 10/15/2024 0313   BUN 20 07/14/2023 1609   CREATININE 0.89 10/15/2024 0313   CALCIUM  8.5 (L) 10/15/2024 0313   PROT 8.3 (H) 10/13/2024 1320   ALBUMIN 3.9 10/13/2024 1320   AST 22 10/13/2024 1320   ALT 20 10/13/2024 1320   ALKPHOS 192 (H) 10/13/2024 1320   BILITOT 0.6 10/13/2024 1320   GFRNONAA >60 10/15/2024 0313   GFRAA >60 07/31/2018 1904   Lipase     Component Value Date/Time   LIPASE 45 08/20/2023 2103       Studies/Results: No results found.   Anti-infectives: Anti-infectives (From admission, onward)    Start     Dose/Rate Route Frequency Ordered Stop   10/15/24 0600  cefTRIAXone  (ROCEPHIN ) 2 g in sodium  chloride 0.9 % 100 mL IVPB        2 g 200 mL/hr over 30 Minutes Intravenous On call to O.R. 10/14/24 0823 10/14/24 1515   10/13/24 2000  piperacillin -tazobactam (ZOSYN ) IVPB 3.375 g        3.375 g 12.5 mL/hr over 240 Minutes Intravenous Every 8 hours 10/13/24 1851 10/20/24 2159        Assessment/Plan  Perirectal/perineal abscess POD#2 s/p EUA, I&D 10/14/24 Dr. Sheldon Afebrile, VSS  TID sitz baths and PRN.  Continue stool softeners and fiber supplementation.      LOS: 3 days   Bernarda JAYSON Ned, MD  Colorectal and General Surgery [facility]

## 2024-10-16 NOTE — Progress Notes (Signed)
 " Progress Note   Patient: Drew [name] FMW:[id number] DOB: 11-04-1975 DOA: 10/13/2024     3 DOS: the patient was seen and examined on 10/16/2024   Brief hospital course: Drew [name] is a 49 y.o. male with past medical history of type 2 diabetes mellitus, hyperlipidemia, hypertension, CAD, gluteal abscess, perineal abscess who is admitted to general surgery service for perirectal abscess I&D. [facility] took over his inpatient care.  Assessment and Plan: Perirectal abscess: Status post I&D by surgery team. Continue Zosyn  therapy. Continue pain control, surgery team removed packing, advised TID sitz baths and PRN. He did have loose stools today.   Uncontrolled type 1 diabetes mellitus: A1c 11.3. Blood sugars stable as he is NPO. Continue Semglee  25u Q12. Continue Accu-Cheks, sliding scale insulin . Discussed with him the need for tight blood sugar control for wound healing. Diabetes educator on board. TOC for dc plan. Carb consistent diet ordered.   Diabetic neuropathy, retinopathy- States she has bilateral leg pain, tingling, numbness, balance issues. He does have blindness, unable to take care of himself for the wound.  Patient will need TOC evaluation for home health services.   Hypertension- Resume Coreg , Norvasc .   Hyperlipidemia- Resume statin upon discharge.     Out of bed to chair. Incentive spirometry. Nursing supportive care. Fall, aspiration precautions. Diet:  Diet Orders (From admission, onward)     Start     Ordered   10/14/24 1514  Diet Carb Modified Room service appropriate? Yes  Diet effective now       Comments: If patient experiences nausea, vomiting, worsening pain, or worsening distention; then, make NPO with sips/ice chips only until seen by MD  Question Answer Comment  Calorie Level Medium 1600-2000   Fluid consistency: Thin   Room service appropriate? Yes   Na restriction, if any: No Added Salt      10/14/24 1513           DVT  prophylaxis: SCD's Start: 10/14/24 9178 enoxaparin  (LOVENOX ) injection 40 mg Start: 10/13/24 2200 SCDs Start: 10/13/24 2037  Level of care: Med-Surg   Code Status: Full Code  Subjective: Patient is seen and examined today morning. He has diarrhea. Denies pain. Eating good. Wishes to go home tomorrow as he has help at home.  Physical Exam: Vitals:   10/15/24 1408 10/15/24 2233 10/16/24 0509 10/16/24 0801  BP: 132/76 139/82 117/74 (!) 154/85  Pulse: 95 92 88 85  Resp: 17 16 16 16   Temp: 98 F (36.7 C) 98.6 F (37 C) 98.4 F (36.9 C) 98.4 F (36.9 C)  TempSrc: Oral  Oral Oral  SpO2: 100% 97% 98% 95%  Weight:      Height:        General - Middle aged Hispanic male, no apparent distress HEENT - PERRLA, EOMI, atraumatic head, non tender sinuses. Lung - Clear, no rales, rhonchi, wheezes. Heart - S1, S2 heard, no murmurs, rubs, trace pedal edema. Abdomen - Soft, non tender, bowel sounds good Neuro - Alert, awake and oriented x 3, non focal exam. Skin - Warm and dry.  Data Reviewed:      Latest Ref Rng & Units 10/15/2024    3:13 AM 10/14/2024   12:54 AM 10/13/2024    1:39 PM  CBC  WBC 4.0 - 10.5 K/uL 7.6  6.2    Hemoglobin 13.0 - 17.0 g/dL 87.9  86.2  85.3   Hematocrit 39.0 - 52.0 % 35.0  38.8  43.0   Platelets 150 - 400 K/uL 190  214        Latest Ref Rng & Units 10/15/2024    3:13 AM 10/14/2024   12:54 AM 10/13/2024    1:39 PM  BMP  Glucose 70 - 99 mg/dL 747  735  661   BUN 6 - 20 mg/dL 15  20  25    Creatinine 0.61 - 1.24 mg/dL 9.10  9.17  9.19   Sodium 135 - 145 mmol/L 135  138  136   Potassium 3.5 - 5.1 mmol/L 3.9  3.6  4.0   Chloride 98 - 111 mmol/L 102  104  102   CO2 22 - 32 mmol/L 24  27    Calcium  8.9 - 10.3 mg/dL 8.5  9.2     No results found.  Family Communication: Discussed with patient he understand and agree. All questions answered.  Disposition: Status is: Inpatient Remains inpatient appropriate because: IV antibiotics, wound care, PT/ OT  Planned  Discharge Destination: Home with Home Health     Time spent: 44 minutes  Author: Concepcion Riser, MD 10/16/2024 2:00 PM Secure chat 7am to 7pm For on call review [email or web address].    "

## 2024-10-16 NOTE — Evaluation (Signed)
 Physical Therapy Evaluation Patient Details Name: Drew [name] MRN: [id number] DOB: 1976/02/17 Today's Date: 10/16/2024  History of Present Illness  Pt is a 49yo male presenting s/p perirectal abscess I&D  Of note, pt is legally blind.  PMH: L gluteal abscess, CAD, DM w/diabetic retinopathy, HLD, HTN, hx of NSTEMI, CKI, DKA,  Clinical Impression  Pt admitted, presents s/p the above procedure, pt is blind. Pt resting in bed and reporting dizziness, BP 142/87 in supine, reported dizziness and BP result to RN. Pt guided through bed mobility mod IND with HOB elevated, STS to HHA with CGA, ambulation 4ft with CGA and IV pole, pt in recliner with all needs met and chair alarm on at end of session. Added pt to mobility specialist caseload to improve opportunities for exercise/ambulation. Pt currently with functional limitations due to the deficits listed below (see PT Problem List). Pt will benefit from acute skilled PT to increase their independence and safety with mobility to allow discharge.          If plan is discharge home, recommend the following: A little help with walking and/or transfers;A little help with bathing/dressing/bathroom;Assistance with cooking/housework;Assist for transportation;Help with stairs or ramp for entrance   Can travel by private vehicle        Equipment Recommendations Rolling walker (2 wheels)  Recommendations for Other Services       Functional Status Assessment Patient has had a recent decline in their functional status and demonstrates the ability to make significant improvements in function in a reasonable and predictable amount of time.     Precautions / Restrictions Precautions Precautions: Fall Restrictions Weight Bearing Restrictions Per Provider Order: No      Mobility  Bed Mobility Overal bed mobility: Modified Independent             General bed mobility comments: Increased time.    Transfers Overall transfer level: Needs  assistance Equipment used: 1 person hand held assist Transfers: Sit to/from Stand Sit to Stand: Contact guard assist           General transfer comment: Increased time, HHA for tactile cuing and for balance    Ambulation/Gait Ambulation/Gait assistance: Contact guard assist Gait Distance (Feet): 50 Feet Assistive device: IV Pole, 1 person hand held assist Gait Pattern/deviations: Step-through pattern, Narrow base of support Gait velocity: decreased     General Gait Details: Pt instructed in gait training with IV pole and 1-HHA with simple 1-step commands to avoid obstacles as pt is blind, 58ft with 2-180deg turns and no overt LOB noted.  Stairs            Wheelchair Mobility     Tilt Bed    Modified Rankin (Stroke Patients Only)       Balance Overall balance assessment: Needs assistance Sitting-balance support: Feet supported, No upper extremity supported Sitting balance-Leahy Scale: Good     Standing balance support: Reliant on assistive device for balance, During functional activity, Single extremity supported Standing balance-Leahy Scale: Fair                               Pertinent Vitals/Pain Pain Assessment Pain Assessment: 0-10 Pain Score: 5  Pain Location: L foot Pain Descriptors / Indicators: Sore    Home Living Family/patient expects to be discharged to:: Private residence Living Arrangements: Other relatives (Brother) Available Help at Discharge:  (none, family works) Type of Home: Mobile home Home Access: Stairs to enter Entrance  Stairs-Rails: Right;Can reach Technical Sales Engineer of Steps: 4   Home Layout: One level Home Equipment: Cane - single point      Prior Function Prior Level of Function : Needs assist             Mobility Comments: Pt blind, requires cane, ambulates around trailer without assistance. ADLs Comments: Able to perform but blind.     Extremity/Trunk Assessment   Upper Extremity  Assessment Upper Extremity Assessment: Defer to OT evaluation    Lower Extremity Assessment Lower Extremity Assessment: Overall WFL for tasks assessed    Cervical / Trunk Assessment Cervical / Trunk Assessment: Normal  Communication   Communication Communication: Other (comment) Factors Affecting Communication: Non - English speaking, interpreter not available    Cognition Arousal: Alert Behavior During Therapy: WFL for tasks assessed/performed   PT - Cognitive impairments: No apparent impairments                         Following commands: Intact       Cueing Cueing Techniques: Verbal cues, Tactile cues     General Comments      Exercises     Assessment/Plan    PT Assessment Patient needs continued PT services  PT Problem List Decreased strength;Decreased activity tolerance;Decreased balance;Decreased mobility;Decreased coordination;Decreased knowledge of use of DME       PT Treatment Interventions DME instruction;Gait training;Stair training;Functional mobility training;Therapeutic activities;Therapeutic exercise;Balance training;Patient/family education;Wheelchair mobility training    PT Goals (Current goals can be found in the Care Plan section)  Acute Rehab PT Goals Patient Stated Goal: To go home PT Goal Formulation: With patient Time For Goal Achievement: 10/30/24 Potential to Achieve Goals: Good    Frequency Min 1X/week     Co-evaluation               AM-PAC PT 6 Clicks Mobility  Outcome Measure Help needed turning from your back to your side while in a flat bed without using bedrails?: A Little Help needed moving from lying on your back to sitting on the side of a flat bed without using bedrails?: A Little Help needed moving to and from a bed to a chair (including a wheelchair)?: A Little Help needed standing up from a chair using your arms (e.g., wheelchair or bedside chair)?: A Little Help needed to walk in hospital room?: A  Little Help needed climbing 3-5 steps with a railing? : A Lot 6 Click Score: 17    End of Session Equipment Utilized During Treatment: Gait belt Activity Tolerance: Patient tolerated treatment well;No increased pain Patient left: in chair;with call bell/phone within reach;with chair alarm set;with SCD's reapplied Nurse Communication: Mobility status;Other (comment) (dizzy) PT Visit Diagnosis: Difficulty in walking, not elsewhere classified (R26.2);Other abnormalities of gait and mobility (R26.89)    Time: 8586-8562 PT Time Calculation (min) (ACUTE ONLY): 24 min   Charges:   PT Evaluation $PT Eval Low Complexity: 1 Low PT Treatments $Gait Training: 8-22 mins PT General Charges $$ ACUTE PT VISIT: 1 Visit         Elsie Grieves, PT, DPT [facility] Rehabilitation Department Office: [phone]  Elsie Grieves 10/16/2024, 2:44 PM

## 2024-10-16 NOTE — Plan of Care (Signed)
" °  Problem: Pain Managment: Goal: General experience of comfort will improve and/or be controlled Outcome: Progressing   Problem: Elimination: Goal: Will not experience complications related to urinary retention Outcome: Progressing   Problem: Coping: Goal: Level of anxiety will decrease Outcome: Progressing   Problem: Nutrition: Goal: Adequate nutrition will be maintained Outcome: Progressing   Problem: Activity: Goal: Risk for activity intolerance will decrease Outcome: Progressing   Problem: Clinical Measurements: Goal: Ability to maintain clinical measurements within normal limits will improve Outcome: Progressing   Problem: Education: Goal: Knowledge of General Education information will improve Description: Including pain rating scale, medication(s)/side effects and non-pharmacologic comfort measures Outcome: Progressing   "

## 2024-10-17 ENCOUNTER — Other Ambulatory Visit: Payer: Self-pay

## 2024-10-17 ENCOUNTER — Other Ambulatory Visit (HOSPITAL_COMMUNITY): Payer: Self-pay

## 2024-10-17 LAB — GLUCOSE, CAPILLARY
Glucose-Capillary: 294 mg/dL — ABNORMAL HIGH (ref 70–99)
Glucose-Capillary: 156 mg/dL — ABNORMAL HIGH (ref 70–99)

## 2024-10-17 MED ORDER — BASAGLAR KWIKPEN 100 UNIT/ML ~~LOC~~ SOPN
25.0000 [IU] | PEN_INJECTOR | Freq: Two times a day (BID) | SUBCUTANEOUS | 1 refills | Status: AC
  Filled 2024-10-17: qty 15, 30d supply, fill #0
  Filled 2024-10-17: qty 45, 90d supply, fill #0

## 2024-10-17 MED ORDER — ATORVASTATIN CALCIUM 20 MG PO TABS
20.0000 mg | ORAL_TABLET | Freq: Every day | ORAL | 1 refills | Status: AC
  Filled 2024-10-17: qty 90, 90d supply, fill #0

## 2024-10-17 MED ORDER — AMLODIPINE BESYLATE 5 MG PO TABS
5.0000 mg | ORAL_TABLET | Freq: Every day | ORAL | 1 refills | Status: AC
  Filled 2024-10-17: qty 90, 90d supply, fill #0

## 2024-10-17 MED ORDER — MECLIZINE HCL 25 MG PO TABS
25.0000 mg | ORAL_TABLET | Freq: Three times a day (TID) | ORAL | 0 refills | Status: AC | PRN
  Filled 2024-10-17 (×2): qty 30, 10d supply, fill #0

## 2024-10-17 MED ORDER — GABAPENTIN 300 MG PO CAPS
300.0000 mg | ORAL_CAPSULE | Freq: Two times a day (BID) | ORAL | 3 refills | Status: AC
  Filled 2024-10-17 (×2): qty 180, 90d supply, fill #0

## 2024-10-17 MED ORDER — OXYCODONE HCL 5 MG PO TABS
5.0000 mg | ORAL_TABLET | Freq: Four times a day (QID) | ORAL | 0 refills | Status: AC | PRN
  Filled 2024-10-17 (×2): qty 20, 3d supply, fill #0

## 2024-10-17 MED ORDER — GLIMEPIRIDE 2 MG PO TABS
2.0000 mg | ORAL_TABLET | Freq: Every day | ORAL | 2 refills | Status: AC
  Filled 2024-10-17 (×2): qty 90, 90d supply, fill #0

## 2024-10-17 MED ORDER — CARVEDILOL 3.125 MG PO TABS
3.1250 mg | ORAL_TABLET | Freq: Two times a day (BID) | ORAL | 2 refills | Status: AC
  Filled 2024-10-17: qty 180, 90d supply, fill #0

## 2024-10-17 MED ORDER — AMOXICILLIN-POT CLAVULANATE 875-125 MG PO TABS
1.0000 | ORAL_TABLET | Freq: Two times a day (BID) | ORAL | 0 refills | Status: AC
  Filled 2024-10-17 (×2): qty 20, 10d supply, fill #0

## 2024-10-17 NOTE — Progress Notes (Signed)
 OT Cancellation Note  Patient Details Name: Mendel Binsfeld MRN: [id number] DOB: 1976/02/13   Cancelled Treatment:    Reason Eval/Treat Not Completed: Other (comment). OT attempted to see the pt twice for an OT evaluation. On the first attempt, the pt's nurse was present in the room and providing care. During the 2nd attempt, the pt was noted to be in the bathroom alone with the door closed and taking a considerable amount of time in the bathroom; pt did not exit the bathroom after OT waited for a respectable duration.   Delanna JINNY Lesches, OTR/L 10/17/2024, 4:02 PM

## 2024-10-17 NOTE — Progress Notes (Signed)
 Mobility Specialist - Progress Note   10/17/24 0924  Mobility  Activity Ambulated with assistance  Level of Assistance Contact guard assist, steadying assist  Assistive Device Other (Comment) (IV Pole, HHA)  Distance Ambulated (ft) 250 ft  Range of Motion/Exercises Active  Activity Response Tolerated well  Mobility visit 1 Mobility  Mobility Specialist Start Time (ACUTE ONLY) 0910  Mobility Specialist Stop Time (ACUTE ONLY) [id number]  Mobility Specialist Time Calculation (min) (ACUTE ONLY) 14 min   Pt was found sitting EOB and agreeable to mobilize. Requires cues due to blindness. At EOS returned to sit EOB with all needs met. Call bell in reach.   Erminio Leos,  Mobility Specialist Can be reached via Secure Chat

## 2024-10-17 NOTE — Progress Notes (Signed)
 All questions and concerns answered prior to discharge. Patient educated on wound care. Patient exited the floor via wheelchair along side nurse tech Tori, to be discharged home via friend's transportation.

## 2024-10-17 NOTE — Discharge Summary (Signed)
 " Physician Discharge Summary   Patient: Drew [name] MRN: [id number] DOB: [birth year]  Admit date:     10/13/2024  Discharge date: {dischdate:26783}  Discharge Physician: Concepcion Riser   PCP: [name], MD   Recommendations at discharge:  {Tip this will not be part of the note when signed- Example include specific recommendations for outpatient follow-up, pending tests to follow-up on. 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 Service: Hospitalist     {Tip this will not be part of the note when signed Body mass index is 24.68 kg/m. , ,  (Optional):26781}  {(NOTE) Pain control PDMP Statment (Optional):26782} Consultants: *** Procedures performed: ***  Disposition: {Plan; Disposition:26390} Diet recommendation:  Discharge Diet Orders (From admission, onward)     Start     Ordered   10/17/24 0000  Diet - low sodium heart healthy        10/17/24 1100   10/17/24 0000  Diet Carb Modified        10/17/24 1100           {Diet_Plan:26776} DISCHARGE MEDICATION: Allergies as of 10/17/2024   No Known Allergies      Medication List     STOP taking these medications    ibuprofen  600 MG tablet Commonly known as: ADVIL        TAKE these medications    amLODipine  5 MG tablet Commonly known as: NORVASC  Tome 1 tableta (5 mg en total) por va oral diariamente. (Take 1 tablet (5 mg total) by mouth daily.)   amoxicillin -clavulanate 875-125 MG tablet Commonly  known as: AUGMENTIN  Take 1 tablet by mouth 2 (two) times daily for 10 days.   atorvastatin  20 MG tablet Commonly known as: LIPITOR Tome 1 tableta (20 mg en total) por va oral diariamente. (Take 1 tablet (20 mg total) by mouth daily.)   Basaglar  KwikPen 100 UNIT/ML Inject 25 Units into the skin 2 (two) times daily.   carvedilol  3.125 MG tablet Commonly known as: COREG  Take 1 tablet (3.125 mg total) by mouth 2 (two) times daily with a meal.   gabapentin  300 MG capsule Commonly known as: NEURONTIN  Take 1 capsule (300 mg total) by mouth 2 (two) times daily.   glimepiride  2 MG tablet Commonly known as: AMARYL  Tome 1 tableta (2 mg en total) por va oral diariamente antes de desayunar. (Take 1 tablet (2 mg total) by mouth daily before breakfast.)   Insulin  Syringe-Needle U-100 31G X 5/16 0.5 ML Misc Commonly known as: TRUEplus Insulin  Syringe Use to inject 70/30 insulin  twice daily.   loperamide  2 MG tablet Commonly known as: Imodium  A-D Take 1 tablet (2 mg total) by mouth 3 (three) times daily as needed for diarrhea or loose stools.   meclizine  25 MG tablet Commonly known as: ANTIVERT  Take 1 tablet (25 mg total) by mouth 3 (three) times daily as needed for dizziness.   miconazole  2 % cream Commonly known as: Micatin Apply 1 Application topically 2 (two) times daily.   Naphcon-A 0.025-0.3 % ophthalmic solution Generic  drug: naphazoline-pheniramine Place 1 drop into both eyes 2 (two) times daily as needed for eye irritation or allergies.   nystatin  cream Commonly known as: MYCOSTATIN  Apply topically 2 (two) times daily. Apply to penile area, apply after retracting foreskin   oxyCODONE  5 MG immediate release tablet Commonly known as: Oxy IR/ROXICODONE  Take 1-2 tablets (5-10 mg total) by mouth every 6 (six) hours as needed for moderate pain (pain score 4-6). What changed:  how much to take reasons to take this   Pen Needles 32G X 4 MM Misc Use to inject Basaglar  once  daily.               Discharge Care Instructions  (From admission, onward)           Start     Ordered   10/17/24 0000  No dressing needed       Comments: TID sitz baths and PRN   10/17/24 1100            Follow-up Information     Maczis, Puja Gosai, PA-C. Go on 11/09/2024.   Specialty: General Surgery Why: at 11:15 AM for wound check. please arrive by 10:45 AM. Contact information: [address] [place] [address] [place] [address] [phone]                Discharge Exam: Drew [name]   10/13/24 2034 10/14/24 1046  Weight: 63.2 kg 63.2 kg   ***  Condition at discharge: {DC Condition:26389}  The results of significant diagnostics from this hospitalization (including imaging, microbiology, ancillary and laboratory) are listed below for reference.   Imaging Studies: CT ABDOMEN PELVIS W CONTRAST Result Date: 10/13/2024 CLINICAL DATA:  Acute left-sided abdominal pain EXAM: CT ABDOMEN AND PELVIS WITH CONTRAST TECHNIQUE: Multidetector CT imaging of the abdomen and pelvis was performed using the standard protocol following bolus administration of intravenous contrast. RADIATION DOSE REDUCTION: This exam was performed according to the departmental dose-optimization program which includes automated exposure control, adjustment of the mA and/or kV according to patient size and/or use of iterative reconstruction technique. CONTRAST:  OMNIPAQUE  IOHEXOL  300 MG/ML  SOLN COMPARISON:  04/07/2024, 04/21/2024 FINDINGS: Lower chest: No acute pleural or parenchymal lung disease. Hepatobiliary: Diffuse hepatic steatosis. No focal liver abnormality. The gallbladder is unremarkable. Pancreas: Unremarkable. No pancreatic ductal dilatation or surrounding inflammatory changes. Spleen: Normal in size without focal abnormality. Adrenals/Urinary Tract: Adrenal glands are unremarkable. Kidneys are normal, without renal calculi, focal lesion, or hydronephrosis. Bladder is  unremarkable. Stomach/Bowel: No bowel obstruction or ileus. Normal appendix right lower quadrant. No bowel wall thickening or inflammatory change. Vascular/Lymphatic: Aortic atherosclerosis. No enlarged abdominal or pelvic lymph nodes. Reproductive: Prostate is unremarkable. Other: No free fluid or free intraperitoneal gas. No abdominal wall hernia. Musculoskeletal: There are inflammatory changes within the subcutaneous fat in the left gluteal region, extending into the left-sided perineum. A small rim enhancing fluid collection is seen anteriorly in the left perineum, measuring 1.7 x 1.8 cm. 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 No acute or destructive bony abnormalities. Reconstructed images demonstrate no additional findings. IMPRESSION: 1. Inflammatory changes within the left medial gluteal cleft and extending into the left side perineum, with small rim enhancing fluid collection as above. 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 This had previously measured 5.4 x 1.1 cm. 2.  Aortic Atherosclerosis (ICD10-I70.0). 3. Hepatic steatosis. Electronically Signed   By: Ozell Daring M.D.   On: 10/13/2024 15:03    Microbiology: Results for orders placed or  performed in visit on 05/14/24  Fecal occult blood, imunochemical([facility])     Status: None   Collection Time: 05/14/24  3:39 PM   Specimen: Stool   ST  Result Value Ref Range Status   Fecal Occult Bld Negative Negative Final  Clostridium Difficile by PCR([facility] only )     Status: None   Collection Time: 05/14/24  4:56 PM   Specimen: Stool   ST  Result Value Ref Range Status   Toxigenic C. Difficile by PCR Negative Negative Final  Stool culture     Status: None   Collection Time: 05/14/24  4:58 PM   Specimen: Stool   ST  Result Value Ref Range Status   Salmonella/Shigella Screen Final report  Final   Stool Culture result 1 (RSASHR) Comment  Final    Comment: No  Salmonella or Shigella recovered.   Campylobacter Culture Final report  Final   Stool Culture result 1 (CMPCXR) Comment  Final    Comment: No Campylobacter species isolated.   E coli, Shiga toxin Assay Negative Negative Final    Labs: CBC: Recent Labs  Lab 10/13/24 1320 10/13/24 1339 10/14/24 0054 10/15/24 0313  WBC 8.4  --  6.2 7.6  NEUTROABS 5.4  --   --   --   HGB 14.9 14.6 13.7 12.0*  HCT 43.5 43.0 38.8* 35.0*  MCV 83.7  --  82.4 83.7  PLT 240  --  214 190   Basic Metabolic Panel: Recent Labs  Lab 10/13/24 1320 10/13/24 1339 10/14/24 0054 10/15/24 0313  NA 135 136 138 135  K 4.0 4.0 3.6 3.9  CL 101 102 104 102  CO2 24  --  27 24  GLUCOSE 348* 338* 264* 252*  BUN 23* 25* 20 15  CREATININE 0.84 0.80 0.82 0.89  CALCIUM  9.4  --  9.2 8.5*  MG  --   --   --  2.3   Liver Function Tests: Recent Labs  Lab 10/13/24 1320  AST 22  ALT 20  ALKPHOS 192*  BILITOT 0.6  PROT 8.3*  ALBUMIN 3.9   CBG: Recent Labs  Lab 10/16/24 0732 10/16/24 1140 10/16/24 1626 10/16/24 2112 10/17/24 0803  GLUCAP 182* 162* 117* 290* 294*    Discharge time spent: {LESS THAN/GREATER THAN:26388} 30 minutes.  Signed: Concepcion Riser, MD [facility] 10/17/2024 "

## 2024-10-17 NOTE — TOC Transition Note (Signed)
 Transition of Care [facility]) - Discharge Note   Patient Details  Name: Drew [name] MRN: [id number] Date of Birth: 1976/09/23  Transition [facility] Care [facility] CM/SW Contact:  Doneta Glenys DASEN, RN Phone Number: 10/17/2024, 2:39 PM   Clinical Narrative:    Due to insurance, CM was not able to secure [facility] or DME equipment. CM reached out to [facility] and [employer]) for charity.  IP CM signing off.  Final next level of care: Home/Self Care Barriers to Discharge: Barriers Resolved   Patient Goals and CMS Choice Patient states their goals for this hospitalization and ongoing recovery are:: Home with my brother CMS Medicare.gov Compare Post Acute Care list provided to:: Patient Choice offered to / list presented to : Patient [place] ownership interest in [facility].provided to:: Patient    Discharge Placement                       Discharge Plan and Services Additional resources added to the After Visit Summary for   In-house Referral: Clinical Social Work, Interpreting Services              DME Arranged: N/A DME Agency: NA       HH Arranged: NA HH Agency: NA        Social Drivers of Health (SDOH) Interventions SDOH Screenings   Food Insecurity: Food Insecurity Present (10/13/2024)  Housing: High Risk (10/13/2024)  Transportation Needs: Unmet Transportation Needs (10/13/2024)  Utilities: At Risk (10/13/2024)  Depression (PHQ2-9): Low Risk (09/19/2023)  Financial Resource Strain: High Risk (01/28/2024)  Tobacco Use: Medium Risk (10/14/2024)  Health Literacy: Inadequate Health Literacy (03/24/2024)     Readmission Risk Interventions    10/15/2024   12:07 PM 04/23/2024    2:28 PM 04/08/2024    2:26 PM  Readmission Risk Prevention Plan  Post Dischage Appt  Complete   Medication Screening  Complete   Transportation Screening Complete Complete Complete  PCP or Specialist Appt within 5-7 Days Complete  Complete  Home Care Screening Complete   Complete  Medication Review (RN CM) Complete  Complete

## 2024-10-17 NOTE — Plan of Care (Signed)
 " Problem: Education: Goal: Knowledge of General Education information will improve Description: Including pain rating scale, medication(s)/side effects and non-pharmacologic comfort measures Outcome: Adequate for Discharge   Problem: Health Behavior/Discharge Planning: Goal: Ability to manage health-related needs will improve Outcome: Adequate for Discharge   Problem: Clinical Measurements: Goal: Ability to maintain clinical measurements within normal limits will improve Outcome: Adequate for Discharge Goal: Will remain free from infection Outcome: Adequate for Discharge Goal: Diagnostic test results will improve Outcome: Adequate for Discharge Goal: Respiratory complications will improve Outcome: Adequate for Discharge Goal: Cardiovascular complication will be avoided Outcome: Adequate for Discharge   Problem: Activity: Goal: Risk for activity intolerance will decrease Outcome: Adequate for Discharge   Problem: Nutrition: Goal: Adequate nutrition will be maintained Outcome: Adequate for Discharge   Problem: Coping: Goal: Level of anxiety will decrease Outcome: Adequate for Discharge   Problem: Elimination: Goal: Will not experience complications related to bowel motility Outcome: Adequate for Discharge Goal: Will not experience complications related to urinary retention Outcome: Adequate for Discharge   Problem: Pain Managment: Goal: General experience of comfort will improve and/or be controlled Outcome: Adequate for Discharge   Problem: Safety: Goal: Ability to remain free from injury will improve Outcome: Adequate for Discharge   Problem: Skin Integrity: Goal: Risk for impaired skin integrity will decrease Outcome: Adequate for Discharge   Problem: Education: Goal: Ability to describe self-care measures that may prevent or decrease complications (Diabetes Survival Skills Education) will improve Outcome: Adequate for Discharge Goal: Individualized Educational  Video(s) Outcome: Adequate for Discharge   Problem: Coping: Goal: Ability to adjust to condition or change in health will improve Outcome: Adequate for Discharge   Problem: Fluid Volume: Goal: Ability to maintain a balanced intake and output will improve Outcome: Adequate for Discharge   Problem: Health Behavior/Discharge Planning: Goal: Ability to identify and utilize available resources and services will improve Outcome: Adequate for Discharge Goal: Ability to manage health-related needs will improve Outcome: Adequate for Discharge   Problem: Metabolic: Goal: Ability to maintain appropriate glucose levels will improve Outcome: Adequate for Discharge   Problem: Nutritional: Goal: Maintenance of adequate nutrition will improve Outcome: Adequate for Discharge Goal: Progress toward achieving an optimal weight will improve Outcome: Adequate for Discharge   Problem: Skin Integrity: Goal: Risk for impaired skin integrity will decrease Outcome: Adequate for Discharge   Problem: Tissue Perfusion: Goal: Adequacy of tissue perfusion will improve Outcome: Adequate for Discharge   Problem: Education: Goal: Knowledge of the prescribed therapeutic regimen will improve Outcome: Adequate for Discharge   Problem: Bowel/Gastric: Goal: Gastrointestinal status for postoperative course will improve Outcome: Adequate for Discharge   Problem: Cardiac: Goal: Ability to maintain an adequate cardiac output Outcome: Adequate for Discharge Goal: Will show no evidence of cardiac arrhythmias Outcome: Adequate for Discharge   Problem: Nutritional: Goal: Will attain and maintain optimal nutritional status Outcome: Adequate for Discharge   Problem: Neurological: Goal: Will regain or maintain usual level of consciousness Outcome: Adequate for Discharge   Problem: Clinical Measurements: Goal: Ability to maintain clinical measurements within normal limits Outcome: Adequate for  Discharge Goal: Postoperative complications will be avoided or minimized Outcome: Adequate for Discharge   Problem: Respiratory: Goal: Will regain and/or maintain adequate ventilation Outcome: Adequate for Discharge Goal: Respiratory status will improve Outcome: Adequate for Discharge   Problem: Skin Integrity: Goal: Demonstrates signs of wound healing without infection Outcome: Adequate for Discharge   Problem: Urinary Elimination: Goal: Will remain free from infection Outcome: Adequate for Discharge Goal: Ability to  achieve and maintain adequate urine output Outcome: Adequate for Discharge   "

## 2024-10-18 ENCOUNTER — Other Ambulatory Visit: Payer: Self-pay

## 2024-10-18 ENCOUNTER — Other Ambulatory Visit (HOSPITAL_COMMUNITY): Payer: Self-pay

## 2024-10-18 ENCOUNTER — Telehealth: Payer: Self-pay | Admitting: *Deleted

## 2024-10-18 NOTE — Transitions of Care (Post Inpatient/ED Visit) (Signed)
 "  10/18/2024  Name: Drew [name] MRN: [id number] DOB: [birth year]  Today's TOC FU Call Status: Today's TOC FU Call Status:: Successful TOC FU Call Completed TOC FU Call Complete Date: 10/18/24  Patient's Name and Date of Birth confirmed. Name, DOB  Transition Care Management Follow-up Telephone Call Date of Discharge: 10/17/24 Discharge Facility: [facility]) Type of Discharge: Inpatient Admission Primary Inpatient Discharge Diagnosis:: Perineal abscess, How have you been since you were released from the hospital?: Better Any questions or concerns?: Yes Patient Questions/Concerns:: Patient has questions regarding pain managment Patient Questions/Concerns Addressed: Other: (RNCM reviewed pain managment with patient)  Items Reviewed: Did you receive and understand the discharge instructions provided?: Yes Medications obtained,verified, and reconciled?: Yes (Medications Reviewed) Any new allergies since your discharge?: No Dietary orders reviewed?: Yes Type of Diet Ordered:: Carb modified, low sodium, heart heatlhy Do you have support at home?: Yes People in Home [RPT]: sibling(s) Name of Support/Comfort Primary Source: Brother/Herminio  Medications Reviewed Today: Medications Reviewed Today     Reviewed by Lucky Andrea LABOR, RN (Registered Nurse) on 10/18/24 at 1239  Med List Status: <None>   Medication Order Taking? Sig Documenting Provider Last Dose Status Informant  amLODipine  (NORVASC ) 5 MG tablet [id number] Yes Take 1 tablet (5 mg total) by mouth daily. Darci Pore, MD  Active   amoxicillin -clavulanate (AUGMENTIN ) 875-125 MG tablet [id number] Yes Take 1 tablet by mouth 2 (two) times daily for 10 days. Darci Pore, MD  Active   atorvastatin  (LIPITOR) 20 MG tablet [id number] Yes Take 1 tablet (20 mg total) by mouth daily. Darci Pore, MD  Active   carvedilol  (COREG ) 3.125 MG tablet [id number] Yes Take 1 tablet (3.125 mg total) by mouth 2 (two)  times daily with a meal. Darci Pore, MD  Active   gabapentin  (NEURONTIN ) 300 MG capsule [id number] Yes Take 1 capsule (300 mg total) by mouth 2 (two) times daily. Darci Pore, MD  Active   glimepiride  (AMARYL ) 2 MG tablet [id number] Yes Take 1 tablet (2 mg total) by mouth daily before breakfast. Darci Pore, MD  Active   Insulin  Glargine (BASAGLAR  KWIKPEN) 100 UNIT/ML [id number]  Inject 25 Units into the skin 2 (two) times daily.  Patient not taking: Reported on 10/18/2024   Darci Pore, MD  Active   Insulin  Pen Needle (PEN NEEDLES) 32G X 4 MM MISC [id number]  Use to inject Basaglar  once daily. [name], MD  Active Self, Pharmacy Records  Insulin  Syringe-Needle U-100 (TRUEPLUS INSULIN  SYRINGE) 31G X 5/16 0.5 ML MISC [id number]  Use to inject 70/30 insulin  twice daily. [name], MD  Active Self, Pharmacy Records  loperamide  (IMODIUM  A-D) 2 MG tablet [id number]  Take 1 tablet (2 mg total) by mouth 3 (three) times daily as needed for diarrhea or loose stools.  Patient not taking: Reported on 10/18/2024   [name], MD  Active Self, Pharmacy Records  meclizine  (ANTIVERT ) 25 MG tablet [id number] Yes Take 1 tablet (25 mg total) by mouth 3 (three) times daily as needed for dizziness. Darci Pore, MD  Active   miconazole  (MICATIN) 2 % cream [id number]  Apply 1 Application topically 2 (two) times daily.  Patient not taking: Reported on 10/18/2024   [name], MD  Active Self, Pharmacy Records           Med Note (CRUTHIS, SHEFFIELD BROCKS   Thu Apr 22, 2024  1:50 PM) LF 03/25 FOR 15 DS  naphazoline-pheniramine (NAPHCON-A) 0.025-0.3 % ophthalmic solution [id number]  Place 1  drop into both eyes 2 (two) times daily as needed for eye irritation or allergies.  Patient not taking: Reported on 10/18/2024   [name], MD  Active Self, Pharmacy Records  nystatin  cream (MYCOSTATIN ) [id number]  Apply topically 2 (two) times daily. Apply to  penile area, apply after retracting foreskin  Patient not taking: Reported on 10/18/2024   Tammy Sor, PA-C  Active Self, Pharmacy Records  oxyCODONE  (OXY IR/ROXICODONE ) 5 MG immediate release tablet [id number] Yes Take 1-2 tablets (5-10 mg total) by mouth every 6 (six) hours as needed for moderate pain (pain score 4-6). Darci Pore, MD  Active             Home Care and Equipment/Supplies: Were Home Health Services Ordered?: No Any new equipment or medical supplies ordered?: No  Functional Questionnaire: Do you need assistance with bathing/showering or dressing?: No Do you need assistance with meal preparation?: No Do you need assistance with eating?: No Do you have difficulty maintaining continence: No Do you need assistance with getting out of bed/getting out of a chair/moving?: No Do you have difficulty managing or taking your medications?: No  Follow up appointments reviewed: PCP Follow-up appointment confirmed?: Yes Date of PCP follow-up appointment?: 11/01/24 Follow-up Provider: Dr. Vicci Specialist [facility] Follow-up appointment confirmed?: Yes Date of Specialist follow-up appointment?: 11/09/24 Follow-Up Specialty Provider:: General Surgeon Do you need transportation to your follow-up appointment?: No (Patient will ask his brother to take him to upcoming appointments.) Do you understand care options if your condition(s) worsen?: Yes-patient verbalized understanding  SDOH Interventions Today    Flowsheet Row Most Recent Value  SDOH Interventions   Food Insecurity Interventions Other (Comment)  [Advised paitent to have his brother take him on Monday to the Social Services office in the county where he resides to apply for food benefits]  Housing Interventions Other (Comment)  [Patient has housing at this time]  Transportation Interventions Other (Comment)  [Patient's brother will take him to upcoming appointments]  Utilities Interventions Other (Comment),  Intervention Not Indicated    Goals Addressed             This Visit's Progress    VBCI Transitions of Care (TOC) Care Plan       Problems:  Recent Hospitalization for treatment of Perineal Abscess Knowledge Deficit Related to pain management  Goal:  Over the next 30 days, the patient will not experience hospital readmission  Interventions:  Transitions of Care: Doctor Visits  - discussed the importance of doctor visits Post discharge activity limitations prescribed by provider reviewed Post-op wound/incision care reviewed with patient/caregiver Reviewed Signs and symptoms of infection Medication review SDOH assessment Educated patient on Sitz bath Advised increasing protein in his diet Discussed the importance of blood sugar management for wound healing Provided patient with [facility] address to inquire about resources and food benefits Utilized Research Officer, [employer] via [employer] with [place], [facility] with PCP office Reviewed upcoming appointments  Patient Self Care Activities:  Attend all scheduled provider appointments Call pharmacy for medication refills 3-7 days in advance of running out of medications Call provider office for new concerns or questions  Notify RN Care Manager of [facility] call rescheduling needs Participate in Transition of Care Program/Attend TOC scheduled calls Take medications as prescribed    Plan:  Telephone follow up appointment with care management team member scheduled for:  10/25/24 at 2pm        Discussed and offered 30 day TOC program.  Patient  enrolled .  The patient has been provided with contact information for the care management team and has been advised to call with any health -related questions or concerns.  The patient verbalized understanding with current plan of care.  The patient is directed to their insurance card regarding availability of benefits coverage.    Andrea Dimes RN, BSN [facility]  Value[facility]Based [facility] Institute [facility] RN Care Manager [phone]  "

## 2024-10-25 ENCOUNTER — Other Ambulatory Visit: Payer: Self-pay | Admitting: *Deleted

## 2024-10-25 DIAGNOSIS — F3289 Other specified depressive episodes: Secondary | ICD-10-CM

## 2024-10-25 NOTE — Transitions of Care (Post Inpatient/ED Visit) (Signed)
 " Transition of Care week 2  Visit Note  10/25/2024  Name: Drew [name] MRN: [id number]          DOB: 1976/02/22  Situation: Patient enrolled in [facility] 30-day program. Visit completed with [name] by telephone.   Background:   Initial Transition Care Management Follow-up Telephone Call Discharge Date and Diagnosis: 10/17/24, Perineal abscess,   Past Medical History:  Diagnosis Date   Abscess, gluteal, left    Coronary artery disease    Diabetic retinopathy (HCC)    Hyperlipidemia associated with type 2 diabetes mellitus (HCC)    Hypertension    Type 2 diabetes mellitus (HCC)     Assessment: Patient Reported Symptoms: Cognitive Cognitive Status: Able to follow simple commands, Alert and oriented to person, place, and time, Normal speech and language skills (Research Officer, [employer] utilized)      Neurological Neurological Review of Symptoms: Not assessed    HEENT HEENT Symptoms Reported: Eye pain HEENT Management Strategies: Routine screening HEENT Self-Management Outcome: 3 (uncertain) HEENT Comment: Patient has follow up with PCP on 11/01/24 and will discuss. Per office CM, patient has had 2 different eye exams, no surgery to be scheduled.    Cardiovascular Cardiovascular Symptoms Reported: No symptoms reported    Respiratory Respiratory Symptoms Reported: No symptoms reported    Endocrine Endocrine Symptoms Reported: No symptoms reported Is patient diabetic?: Yes Is patient checking blood sugars at home?: No    Gastrointestinal Gastrointestinal Symptoms Reported: Diarrhea Additional Gastrointestinal Details: On going issue with diarrhea after eating Gastrointestinal Management Strategies: Coping strategies, Adequate rest Gastrointestinal Self-Management Outcome: 3 (uncertain) Gastrointestinal Comment: Patient was scheduled for GI consult in October 2025 and did not keep appointment. RNCM will request a new referral to [facility] due to patient now lives in  [place].    Genitourinary Genitourinary Symptoms Reported: No symptoms reported    Integumentary Integumentary Symptoms Reported: Incision Additional Integumentary Details: Two perineal incisions-patient reports improvement Skin Management Strategies: Coping strategies, Adequate rest, Medication therapy, Routine screening Skin Self-Management Outcome: 4 (good) Skin Comment: Reviewed the importance of continuing sitz baths. Follow up with General Surgery on 11/09/24  Musculoskeletal Musculoskelatal Symptoms Reviewed: No symptoms reported        Psychosocial Psychosocial Symptoms Reported: Depression - if selected complete PHQ 2-9 Additional Psychological Details: Referral to LCSW for managing depression   Major Change/Loss/Stressor/Fears (CP): Medical condition, self Techniques to Cope with Loss/Stress/Change: Withdraw Quality of Family Relationships: involved, supportive (Patient's brother is supportive, but works a lot) Do you feel physically threatened by others?: No   There were no vitals filed for this visit. Pain Score: 6  Pain Type: Surgical pain Pain Location: Perineum  Medications Reviewed Today     Reviewed by Lucky Andrea LABOR, RN (Registered Nurse) on 10/25/24 at 1457  Med List Status: <None>   Medication Order Taking? Sig Documenting Provider Last Dose Status Informant  amLODipine  (NORVASC ) 5 MG tablet [id number] Yes Take 1 tablet (5 mg total) by mouth daily. Darci Pore, MD  Active   amoxicillin -clavulanate (AUGMENTIN ) 875-125 MG tablet [id number] Yes Take 1 tablet by mouth 2 (two) times daily for 10 days. Darci Pore, MD  Active   atorvastatin  (LIPITOR) 20 MG tablet [id number] Yes Take 1 tablet (20 mg total) by mouth daily. Darci Pore, MD  Active   carvedilol  (COREG ) 3.125 MG tablet [id number] Yes Take 1 tablet (3.125 mg total) by mouth 2 (two) times daily with a meal. Darci Pore, MD  Active   gabapentin  (NEURONTIN ) 300 MG  capsule  [id number] Yes Take 1 capsule (300 mg total) by mouth 2 (two) times daily. Darci Pore, MD  Active   glimepiride  (AMARYL ) 2 MG tablet [id number] Yes Take 1 tablet (2 mg total) by mouth daily before breakfast. Darci Pore, MD  Active   Insulin  Glargine (BASAGLAR  KWIKPEN) 100 UNIT/ML [id number] Yes Inject 25 Units into the skin 2 (two) times daily. Darci Pore, MD  Active   Insulin  Pen Needle (PEN NEEDLES) 32G X 4 MM MISC [id number] Yes Use to inject Basaglar  once daily. [name], MD  Active Self, Pharmacy Records  Insulin  Syringe-Needle U-100 (TRUEPLUS INSULIN  SYRINGE) 31G X 5/16 0.5 ML MISC [id number] Yes Use to inject 70/30 insulin  twice daily. [name], MD  Active Self, Pharmacy Records  loperamide  (IMODIUM  A-D) 2 MG tablet [id number]  Take 1 tablet (2 mg total) by mouth 3 (three) times daily as needed for diarrhea or loose stools.  Patient not taking: Reported on 10/25/2024   [name], MD  Active Self, Pharmacy Records  meclizine  (ANTIVERT ) 25 MG tablet [id number] Yes Take 1 tablet (25 mg total) by mouth 3 (three) times daily as needed for dizziness. Darci Pore, MD  Active   miconazole  (MICATIN) 2 % cream [id number]  Apply 1 Application topically 2 (two) times daily.  Patient not taking: Reported on 10/25/2024   [name], MD  Active Self, Pharmacy Records           Med Note (CRUTHIS, CHLOE C   Thu Apr 22, 2024  1:50 PM) LF 03/25 FOR 15 DS  naphazoline-pheniramine (NAPHCON-A) 0.025-0.3 % ophthalmic solution [id number]  Place 1 drop into both eyes 2 (two) times daily as needed for eye irritation or allergies.  Patient not taking: Reported on 10/25/2024   [name], MD  Active Self, Pharmacy Records  nystatin  cream (MYCOSTATIN ) [id number]  Apply topically 2 (two) times daily. Apply to penile area, apply after retracting foreskin  Patient not taking: Reported on 10/25/2024   Tammy Sor, PA-C  Active Self, Pharmacy  Records  oxyCODONE  (OXY IR/ROXICODONE ) 5 MG immediate release tablet [id number] Yes Take 1-2 tablets (5-10 mg total) by mouth every 6 (six) hours as needed for moderate pain (pain score 4-6). Darci Pore, MD  Active             Recommendation:   Continue Current Plan of Care  Follow Up Plan:   Telephone follow-up in 1 week  Andrea Dimes RN, BSN [place]  Value-Based Care Institute [facility] RN Care Manager [phone]     "

## 2024-10-25 NOTE — Patient Instructions (Signed)
 Visit Information  Thank you for taking time to visit with me today. Please don't hesitate to contact me if I can be of assistance to you before our next scheduled telephone appointment.   Following is a copy of your care plan:   Goals Addressed             This Visit's Progress    VBCI Transitions of Care (TOC) Care Plan       Problems:  Recent Hospitalization for treatment of Perineal Abscess Knowledge Deficit Related to pain management  Goal:  Over the next 30 days, the patient will not experience hospital readmission  Interventions:  Transitions of Care: Doctor Visits  - discussed the importance of doctor visits Post discharge activity limitations prescribed by provider reviewed Post-op wound/incision care reviewed with patient/caregiver Reviewed Signs and symptoms of infection Medication review-advised patient to request any needed refills from the pharmacy on 11/01/24  Educated patient on Sitz bath-revisited Advised increasing protein in his diet Discussed the importance of blood sugar management for wound healing Utilized Research Officer, [employer] via [employer] with [place], RNCM and Arthor Meth, SW Reviewed upcoming appointments including:PCP on 11/01/24-patient's brother will transport and 11/09/24 to Surgeon's office Referral to LCSW for managing depression Communicated with PCP for referral to GI in [facility]  Patient Self Care Activities:  Attend all scheduled provider appointments Call pharmacy for medication refills 3-7 days in advance of running out of medications Call provider office for new concerns or questions  Notify RN Care Manager of TOC call rescheduling needs Participate in Transition of Care Program/Attend TOC scheduled calls Take medications as prescribed    Plan:  Telephone follow up appointment with care management team member scheduled for:  11/01/24 at 11am         The patient verbalized understanding of instructions,  educational materials, and care plan provided today and agreed to receive a mailed copy of patient instructions, educational materials, and care plan.   Telephone follow up appointment with care management team member scheduled for:11/01/24 at 11am  Please call the care guide team at [phone] if you need to cancel or reschedule your appointment.   Please call 1-800-273-TALK (toll free, 24 hour hotline) call 911 if you are experiencing a Mental Health or Behavioral Health Crisis or need someone to talk to.  Andrea Dimes RN, BSN [place]  Value[facility]Based Care Institute [facility] RN Care Manager [phone]

## 2024-11-01 ENCOUNTER — Telehealth: Payer: Self-pay | Admitting: *Deleted

## 2024-11-01 ENCOUNTER — Telehealth: Payer: Self-pay | Admitting: Internal Medicine

## 2024-11-01 ENCOUNTER — Ambulatory Visit: Payer: Self-pay | Admitting: Internal Medicine

## 2024-11-01 ENCOUNTER — Telehealth: Payer: Self-pay

## 2024-11-01 ENCOUNTER — Encounter: Payer: Self-pay | Admitting: *Deleted

## 2024-11-01 NOTE — Progress Notes (Unsigned)
 Complex Care Management Note Care Guide Note  11/01/2024 Name: Drew [name] MRN: [id number] DOB: Apr 04, 1976   Complex Care Management Outreach Attempts: A second unsuccessful outreach was attempted today to offer the patient with information about available complex care management services.  Follow Up Plan:  Additional outreach attempts will be made to offer the patient complex care management information and services.   Encounter Outcome:  No Answer  [name]  [facility], [facility] Guide Direct Dial: [phone]  Fax: [phone]

## 2024-11-01 NOTE — Telephone Encounter (Signed)
 Contacted pt (interpreter called to resch appt )

## 2024-11-02 ENCOUNTER — Telehealth: Payer: Self-pay

## 2024-11-02 NOTE — Progress Notes (Signed)
 Complex Care Management Note  Care Guide Note 11/02/2024 Name: Thatcher Doberstein MRN: [id number] DOB: [birth year]  Carolos Fecher is a 49 y.o. year old male who sees [name], MD for primary care. I reached out to [facility] by phone today to offer complex care management services.  Mr. Bogacki was given information about Complex Care Management services today including:   The Complex Care Management services include support from the care team which includes your Nurse Care Manager, Clinical Social Worker, or Pharmacist.  The Complex Care Management team is here to help remove barriers to the health concerns and goals most important to you. Complex Care Management services are voluntary, and the patient may decline or stop services at any time by request to their care team member.   Complex Care Management Consent Status: Patient agreed to services and verbal consent obtained.   Follow up plan:  Telephone appointment with complex care management team member scheduled for:  11/19/2024  Encounter Outcome:  Patient Scheduled   Doyce Razor [facility]  [facility] Guide Direct Dial: [phone]  Fax: [phone]

## 2024-11-03 ENCOUNTER — Encounter: Payer: Self-pay | Admitting: *Deleted

## 2024-11-04 ENCOUNTER — Encounter: Payer: Self-pay | Admitting: *Deleted

## 2024-11-15 ENCOUNTER — Ambulatory Visit: Payer: Self-pay | Admitting: Internal Medicine

## 2024-11-19 ENCOUNTER — Telehealth: Payer: Self-pay | Admitting: Licensed Clinical Social Worker
